# Patient Record
Sex: Male | Born: 1945 | Race: White | Hispanic: No | Marital: Married | Smoking: Former smoker
Health system: Southern US, Community
[De-identification: ages and names within clinical notes are randomized; demographics above are authoritative.]

## PROBLEM LIST (undated history)

## (undated) DIAGNOSIS — E119 Type 2 diabetes mellitus without complications: Secondary | ICD-10-CM

## (undated) DIAGNOSIS — T783XXA Angioneurotic edema, initial encounter: Secondary | ICD-10-CM

## (undated) DIAGNOSIS — J45909 Unspecified asthma, uncomplicated: Secondary | ICD-10-CM

## (undated) DIAGNOSIS — J189 Pneumonia, unspecified organism: Secondary | ICD-10-CM

## (undated) DIAGNOSIS — I4892 Unspecified atrial flutter: Secondary | ICD-10-CM

## (undated) DIAGNOSIS — W57XXXA Bitten or stung by nonvenomous insect and other nonvenomous arthropods, initial encounter: Secondary | ICD-10-CM

## (undated) DIAGNOSIS — I480 Paroxysmal atrial fibrillation: Secondary | ICD-10-CM

## (undated) DIAGNOSIS — T7840XA Allergy, unspecified, initial encounter: Secondary | ICD-10-CM

## (undated) DIAGNOSIS — G4730 Sleep apnea, unspecified: Secondary | ICD-10-CM

## (undated) DIAGNOSIS — I10 Essential (primary) hypertension: Secondary | ICD-10-CM

## (undated) HISTORY — DX: Angioneurotic edema, initial encounter: T78.3XXA

## (undated) HISTORY — DX: Allergy, unspecified, initial encounter: T78.40XA

## (undated) HISTORY — DX: Bitten or stung by nonvenomous insect and other nonvenomous arthropods, initial encounter: W57.XXXA

## (undated) HISTORY — DX: Unspecified asthma, uncomplicated: J45.909

## (undated) HISTORY — PX: COLONOSCOPY: SHX174

## (undated) HISTORY — PX: CHOLECYSTECTOMY: SHX55

## (undated) HISTORY — DX: Unspecified atrial flutter: I48.92

## (undated) HISTORY — PX: APPENDECTOMY: SHX54

## (undated) HISTORY — DX: Type 2 diabetes mellitus without complications: E11.9

## (undated) HISTORY — DX: Paroxysmal atrial fibrillation: I48.0

## (undated) HISTORY — PX: TONSILLECTOMY: SUR1361

## (undated) HISTORY — DX: Sleep apnea, unspecified: G47.30

## (undated) SURGERY — ECHOCARDIOGRAM, TRANSESOPHAGEAL
Anesthesia: Moderate Sedation

---

## 2008-12-13 ENCOUNTER — Encounter: Payer: Self-pay | Admitting: Pulmonary Disease

## 2008-12-13 HISTORY — PX: OTHER SURGICAL HISTORY: SHX169

## 2009-06-07 ENCOUNTER — Encounter: Payer: Self-pay | Admitting: Pulmonary Disease

## 2009-06-11 ENCOUNTER — Encounter: Admission: RE | Admit: 2009-06-11 | Discharge: 2009-06-11 | Payer: Self-pay | Admitting: Emergency Medicine

## 2009-06-14 ENCOUNTER — Ambulatory Visit: Payer: Self-pay | Admitting: Pulmonary Disease

## 2009-06-14 DIAGNOSIS — J9801 Acute bronchospasm: Secondary | ICD-10-CM | POA: Insufficient documentation

## 2009-06-15 DIAGNOSIS — I10 Essential (primary) hypertension: Secondary | ICD-10-CM | POA: Insufficient documentation

## 2009-06-20 ENCOUNTER — Observation Stay (HOSPITAL_COMMUNITY): Admission: AD | Admit: 2009-06-20 | Discharge: 2009-06-21 | Payer: Self-pay | Admitting: Family Medicine

## 2009-06-20 ENCOUNTER — Ambulatory Visit: Payer: Self-pay | Admitting: Family Medicine

## 2009-06-20 ENCOUNTER — Encounter: Payer: Self-pay | Admitting: Pulmonary Disease

## 2009-06-21 ENCOUNTER — Ambulatory Visit: Payer: Self-pay | Admitting: Pulmonary Disease

## 2009-06-26 ENCOUNTER — Ambulatory Visit: Payer: Self-pay | Admitting: Pulmonary Disease

## 2009-06-27 ENCOUNTER — Telehealth: Payer: Self-pay | Admitting: Pulmonary Disease

## 2009-07-19 ENCOUNTER — Ambulatory Visit: Payer: Self-pay | Admitting: Pulmonary Disease

## 2011-01-06 ENCOUNTER — Encounter: Payer: Self-pay | Admitting: Emergency Medicine

## 2011-03-23 LAB — COMPREHENSIVE METABOLIC PANEL
ALT: 24 U/L (ref 0–53)
BUN: 7 mg/dL (ref 6–23)
CO2: 26 mEq/L (ref 19–32)
Calcium: 9.2 mg/dL (ref 8.4–10.5)
Creatinine, Ser: 1.04 mg/dL (ref 0.4–1.5)
GFR calc non Af Amer: >60 mL/min (ref >60)
Glucose, Bld: 109 mg/dL — ABNORMAL HIGH (ref 70–99)
Sodium: 139 mEq/L (ref 135–145)

## 2011-03-23 LAB — CULTURE, BLOOD (ROUTINE X 2): Culture: NO GROWTH

## 2011-03-23 LAB — CBC
HCT: 44.5 % (ref 39.0–52.0)
Hemoglobin: 15.1 g/dL (ref 13.0–17.0)
Hemoglobin: 15.3 g/dL (ref 13.0–17.0)
MCHC: 34.4 g/dL (ref 30.0–36.0)
MCHC: 35.5 g/dL (ref 30.0–36.0)
MCV: 91.7 fL (ref 78.0–100.0)
MCV: 92.1 fL (ref 78.0–100.0)
RBC: 4.65 MIL/uL (ref 4.22–5.81)
RBC: 4.83 MIL/uL (ref 4.22–5.81)
RDW: 12.4 % (ref 11.5–15.5)

## 2011-03-23 LAB — DIFFERENTIAL
Eosinophils Absolute: 0 10*3/uL (ref 0.0–0.7)
Lymphs Abs: 1 10*3/uL (ref 0.7–4.0)
Neutro Abs: 3.8 10*3/uL (ref 1.7–7.7)
Neutrophils Relative %: 70 % (ref 43–77)

## 2011-03-23 LAB — BASIC METABOLIC PANEL
CO2: 28 mEq/L (ref 19–32)
Chloride: 105 mEq/L (ref 96–112)
Creatinine, Ser: 1 mg/dL (ref 0.4–1.5)
GFR calc Af Amer: >60 mL/min (ref >60)
Sodium: 138 mEq/L (ref 135–145)

## 2011-04-29 NOTE — Discharge Summary (Signed)
[name], [name]             ACCOUNT NO.:  [id number]   MEDICAL RECORD NO.:  [id number]          PATIENT TYPE:  INP   LOCATION:  5152                         FACILITY:  [facility]   PHYSICIAN:  Nestor Ramp, MD        DATE OF BIRTH:  05-03-1946   DATE OF ADMISSION:  06/20/2009  DATE OF DISCHARGE:  06/21/2009                               DISCHARGE SUMMARY   PRIMARY CARE PHYSICIAN:  Brett Canales A. Cleta Alberts, M.D. at [facility].   PULMONOLOGIST:  Oretha Milch, MD   DISCHARGE DIAGNOSES:  1. Persistent cough.  2. Hypertension.   DISCHARGE MEDICATIONS:  1. Zithromax 500 mg 1 tab p.o. daily x4 days.  2. QVAR 2 puffs twice a day.  3. Clonidine HCL 0.1 mg p.o. b.i.d.   CONSULT:  Pulmonology, Dr. Vassie Loll who evaluated the patient and he was not  convinced of a left lower lobe pneumonia except questions on the CT  scan, he advised the patient to follow up with PFT's and recommended the  patient to start on QVAR.   PROCEDURES:  Done on June 20, 2009 include a CT of the chest without  contrast, it was consistent with COPD, peribronchial thickening and a  left lower lobe infiltrate.   LABORATORY DATA:  Cold agglutinin titer was drawn and it came back with  a ratio of 1:128 which is a positive titer for that.  A direct  antiglobulin was also drawn and that came back as positive as well.  Blood cultures are still preliminary, but they are so far negative x2.   BRIEF HOSPITAL COURSE:  This is a 65 year old male with the history of  hypertension presenting with about a 3-week history of non-productive  cough.  1. Cough:  The patient has the history of several allergies to      different medicines.  He was started on blood pressure medicine      back in November.  He had an allergy to ACE INHIBITOR, BYSTOLIC,      NORVASC, DAYPRO, and a few others.  He then started getting a      cough, that started in November, and he just could not seem to      shake the cough throughout the last 9 months.   The patient has been      following up with [facility] and over the course of the      past 2-3 days, spiked a fever, had a headache, started to feel      generally ill, so he was referred here for admission.  On      admission, the patient looks well.  He had no acute distress.      Pulmonary was consulted as we could not find a source for this      patient's cough.  Pulmonary recommended PFT's and for the patient      to be followed up as outpatient; however when his cold agglutinin      titer came back positive and his direct antiglobulin came back      positive, a drug-induced anemia  cannot be ruled out and he is to be      followed up as an outpatient.  The patient was discharged with a      Pulmonary appointment to follow up with them to try and find a      source for his cough.  Pulmonary seemed to think it was more of a      restrictive  airway disease rather than something acute that was      going on.   1. Hypertension, blood pressure was well-controlled.  The patient was      continued on his home regimen of clonidine 0.1 mg p.o. b.i.d.  The      patient had stopped the clonidine before his hospital admission and      he was advised not to do that again, as the clonidine can cause a      reactive hypertension.   DISCHARGE INSTRUCTIONS:  The patient was instructed to follow up with  Dr. Cleta Alberts at [facility], as well as Dr. Vassie Loll.  He was also  encouraged to return to the ER or to call 911 if he became short of  breath or if having problem with breathing.   PENDING LABORATORY RESULTS:  Blood cultures which were preliminary but  were negative x2.   FOLLOWUP APPOINTMENTS:  An appointment with Dr. Vassie Loll.  The patient  needed to call for an appointment.  The patient also needed to follow up  with Dr. Cleta Alberts as necessary.   Issues that needed to be followed after discharge are the patient's  positive agglutinin titer and his positive direct antiglobulin.   DISCHARGE  CONDITION:  The patient was discharged to home in stable  medical condition.      Alvia Grove, DO  Electronically Signed      Nestor Ramp, MD  Electronically Signed    BB/MEDQ  D:  06/25/2009  T:  06/26/2009  Job:  [id number]   cc:   Oretha Milch, MD  [place] Cleta Alberts, M.D.
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
## 2011-04-29 NOTE — H&P (Signed)
[name], [name]             ACCOUNT NO.:  [id number]   MEDICAL RECORD NO.:  [id number]          PATIENT TYPE:  INP   LOCATION:  5152                         FACILITY:  [facility]   PHYSICIAN:  Jared Ramp, MD        DATE OF BIRTH:  July 17, 1946   DATE OF ADMISSION:  06/20/2009  DATE OF DISCHARGE:                              HISTORY & PHYSICAL   PRIMARY CARE PHYSICIAN:  Dr. Cleta [name] at [facility].   PULMONOLOGIST:  Jared Locket Vassie Loll, MD, [name], [place].   CHIEF COMPLAINT:  Cough.   HISTORY OF PRESENT ILLNESS:  About 3 weeks ago, the patient presented to  Dr.Daub at [facility] with a questionable pneumonia.  The  patient was started on Avelox and steroids, which he continued for 10  days.  The patient noted no resolve in his symptoms.  He followed up  with pulmonologist at [facility] on June 15, 2009, where he was  evaluated and again started on inhalers again with no help.  He  presented again to the Urgent Care last night with a headache, fever, T-  max of 100.6, increase in blood pressure up to 190.  He was given  Benadryl and Tylenol with no resolve of symptoms.  The patient followed  up with Urgent Care today, and was then admitted to our service.  The  patient complains currently of a cough with some white production,  positive for fever and chills, and positive for nausea.  No vomiting,  diarrhea, or constipation.  No chest pain.  No shortness of breath with  exertion or rest.  Positive for dizziness and headache.   PAST MEDICAL HISTORY:  Shingles last year and hypertension.  Started on  meds, November 2009.   PAST SURGICAL HISTORY:  Appendectomy and tonsillectomy many years ago.   FAMILY HISTORY:  The patient's mother passed away of duodenal cancer.  She also had a history of hypertension.  The patient's father had a  stroke at 6 years of age, and also had a history of MI.  The patient's  siblings:  Sister has a history of hypertension,  brother has mild  muscular dystrophy.   SOCIAL HISTORY:  The patient lives with his wife, daughter, and son.  He  admits to mild tobacco use, which he stopped about 10 years ago, smoking  only pipes.  Alcohol use:  He is a social drinker.  No history of drugs.  The patient works for [employer] where he comes in  contact quite often with soils and other environmental toxins.   MEDICATIONS:  Clonidine 0.1 mg p.o. b.i.d.   ALLERGIES:  LISINOPRIL, BYSTOLIC, NORVASC, DAYPRO, and BISOPROLOL.   REVIEW OF SYSTEMS:  GENERAL:  Positive for fevers, chills, sweats,  decrease in appetite, fatigue, and weight change.  The patient states  that there has been an intentional 20-pound weight loss in the last 3-4  months.  HEENT:  Positive for headache.  RESPIRATORY:  Positive for  cough, wheezing, and production of sputum.  SKIN:  Positive for rashes,  currently resolved.  NEURO:  Positive for weakness.   PHYSICAL EXAMINATION:  VITAL SIGNS:  Currently unavailable.  GENERAL:  No acute distress.  Alert and oriented x3.  Speaking in full  sentences.  HEENT:  Eyes are nonicteric.  PERRL.  Head is normocephalic.  No trauma.  Throat:  No erythema.  No exudate.  Nose:  No maxillary or ethmoid sinus  tenderness.  NECK:  No JVD noted.  No masses.  CV:  Regular rate and rhythm.  No rubs, murmurs, or gallops.  LUNGS:  Rhonchi noted at right lower lobe.  CHEST:  Symmetrical with respirations.  No retractions.  No crackles or  wheezes heard.  ABDOMEN:  Soft.  Nontender.  No masses.  Positive bowel sounds x4.  EXTREMITIES:  No clubbing, cyanosis, or edema.  Pulses are 2+.  NEURO:  Cranial nerves II through XII were intact.  MUSCULOSKELETAL:  No deformities.  No cyanosis or clubbing.   LABORATORY DATA:  June 07, 2009; CT of the head was done without  contrast, it was clear.  It shows the sinuses, ears, and mastoid all to  be clear.  June 20, 2009, ESR was 63.  Chest x-ray was negative.  These   were done at [facility].   ASSESSMENT AND PLAN:  A 65 year old male with a history of cough,  dyspnea, malaise x2 days.  Admitted to the floor.   1. Cough.  Unclear etiology.  Workup has been negative so far.  He has      been worked up at [facility] as well as started seeing a      pulmonologist at [facility].  This has been going on for      about 3 weeks.  He was on Avelox and steroids for about 10 days      with no relief.  Pulmonologist questioned a possible bronchospasm      with questionable exposure to environmental toxin.  The patient was      to follow up with pulmonologist on Friday for a [name] function      test.  Fever, chills, and headache had been there for one day.  We      will order blood cultures.  We will get a CT with contrast of his      chest.  We will repeat his BMP and CBC in the a.m., and look for      any cause of infection.  We will hold off on the antibiotics for      now as the patient just finished his course of Avelox, and we will      do a further workup tomorrow, pending the result of the CT of his      chest.  2. Hypertension.  Blood pressure at [facility] today was      162/72.  The patient has had multiple allergies to hypertensive      medications.  He is currently on clonidine 0.1 mg p.o. b.i.d., but      he did not take his medicine today as he was feeling poorly.  We      will start his clonidine and monitor his blood pressure.  3. Prophylaxis.  The patient was started on heparin 5000 units t.i.d.  4. FEN/GI.  Low-sodium, heart healthy diet.  No IV fluids needed at      this time.  5. Disposition will be pending, his resolution of symptom.      Alvia Grove, DO  Electronically  Signed      Jared Ramp, MD  Electronically Signed    BB/MEDQ  D:  06/20/2009  T:  06/21/2009  Job:  [id number]

## 2012-01-26 ENCOUNTER — Other Ambulatory Visit: Payer: Self-pay | Admitting: Emergency Medicine

## 2012-01-26 NOTE — Telephone Encounter (Signed)
PLEASE PULL CHART

## 2012-02-02 ENCOUNTER — Other Ambulatory Visit: Payer: Self-pay | Admitting: Emergency Medicine

## 2012-02-02 ENCOUNTER — Telehealth: Payer: Self-pay

## 2012-02-02 NOTE — Telephone Encounter (Signed)
Pt would like a refill on his blood pressure rx Mitzam, pt did not know he would have to have an OV in order to have a refill. [facility] [address]

## 2012-02-03 ENCOUNTER — Telehealth: Payer: Self-pay

## 2012-02-03 ENCOUNTER — Emergency Department (INDEPENDENT_AMBULATORY_CARE_PROVIDER_SITE_OTHER): Payer: 59

## 2012-02-03 ENCOUNTER — Encounter (HOSPITAL_BASED_OUTPATIENT_CLINIC_OR_DEPARTMENT_OTHER): Payer: Self-pay | Admitting: Emergency Medicine

## 2012-02-03 ENCOUNTER — Other Ambulatory Visit: Payer: Self-pay

## 2012-02-03 ENCOUNTER — Inpatient Hospital Stay (HOSPITAL_BASED_OUTPATIENT_CLINIC_OR_DEPARTMENT_OTHER)
Admission: EM | Admit: 2012-02-03 | Discharge: 2012-02-04 | DRG: 310 | Disposition: A | Discharge status: Home / Self Care | Payer: 59 | Attending: Internal Medicine | Admitting: Internal Medicine

## 2012-02-03 DIAGNOSIS — I10 Essential (primary) hypertension: Secondary | ICD-10-CM | POA: Diagnosis present

## 2012-02-03 DIAGNOSIS — I4891 Unspecified atrial fibrillation: Secondary | ICD-10-CM

## 2012-02-03 DIAGNOSIS — R0602 Shortness of breath: Secondary | ICD-10-CM

## 2012-02-03 DIAGNOSIS — I4892 Unspecified atrial flutter: Secondary | ICD-10-CM

## 2012-02-03 DIAGNOSIS — R000 Tachycardia, unspecified: Secondary | ICD-10-CM

## 2012-02-03 DIAGNOSIS — Z79899 Other long term (current) drug therapy: Secondary | ICD-10-CM

## 2012-02-03 DIAGNOSIS — F411 Generalized anxiety disorder: Secondary | ICD-10-CM

## 2012-02-03 HISTORY — DX: Pneumonia, unspecified organism: J18.9

## 2012-02-03 HISTORY — DX: Essential (primary) hypertension: I10

## 2012-02-03 LAB — DIFFERENTIAL
Basophils Absolute: 0 10*3/uL (ref 0.0–0.1)
Eosinophils Relative: 3 % (ref 0–5)
Lymphocytes Relative: 29 % (ref 12–46)
Neutro Abs: 4.6 10*3/uL (ref 1.7–7.7)

## 2012-02-03 LAB — COMPREHENSIVE METABOLIC PANEL
ALT: 35 U/L (ref 0–53)
AST: 24 U/L (ref 0–37)
CO2: 27 mEq/L (ref 19–32)
Calcium: 9.4 mg/dL (ref 8.4–10.5)
Chloride: 104 mEq/L (ref 96–112)
GFR calc non Af Amer: 63 mL/min — ABNORMAL LOW (ref >90)
Sodium: 140 mEq/L (ref 135–145)

## 2012-02-03 LAB — CARDIAC PANEL(CRET KIN+CKTOT+MB+TROPI)
CK, MB: 7.1 ng/mL (ref 0.3–4.0)
Relative Index: 3.2 — ABNORMAL HIGH (ref 0.0–2.5)
Total CK: 224 U/L (ref 7–232)
Troponin I: <0.3 ng/mL (ref <0.30)

## 2012-02-03 LAB — CBC
Platelets: 291 10*3/uL (ref 150–400)
RDW: 13 % (ref 11.5–15.5)
WBC: 7.9 10*3/uL (ref 4.0–10.5)

## 2012-02-03 MED ORDER — DILTIAZEM HCL 25 MG/5ML IV SOLN
INTRAVENOUS | Status: AC
  Filled 2012-02-03: qty 5

## 2012-02-03 MED ORDER — ENOXAPARIN SODIUM 60 MG/0.6ML ~~LOC~~ SOLN
120.0000 mg | Freq: Once | SUBCUTANEOUS | Status: AC
  Administered 2012-02-03: 120 mg via SUBCUTANEOUS
  Filled 2012-02-03: qty 1.2

## 2012-02-03 MED ORDER — SODIUM CHLORIDE 0.9 % IV SOLN
Freq: Once | INTRAVENOUS | Status: AC
  Administered 2012-02-03: 22:00:00 via INTRAVENOUS

## 2012-02-03 MED ORDER — DILTIAZEM HCL 50 MG/10ML IV SOLN
10.0000 mg | Freq: Once | INTRAVENOUS | Status: AC
  Administered 2012-02-03: 10 mg via INTRAVENOUS
  Filled 2012-02-03: qty 2

## 2012-02-03 MED ORDER — DEXTROSE 5 % IV SOLN
5.0000 mg/h | Freq: Once | INTRAVENOUS | Status: AC
  Administered 2012-02-03: 5 mg/h via INTRAVENOUS

## 2012-02-03 MED ORDER — DILTIAZEM HCL 100 MG IV SOLR
INTRAVENOUS | Status: AC
  Filled 2012-02-03: qty 100

## 2012-02-03 NOTE — ED Provider Notes (Signed)
History     CSN: [id number]  Arrival date & time 02/03/12  2051   First MD Initiated Contact with Patient 02/03/12 2111      Chief Complaint  Patient presents with  . Tachycardia    (Consider location/radiation/quality/duration/timing/severity/associated sxs/prior treatment) HPI Comments: States was not feeling well on Sunday, today started with heart racing, feeling weak.  Denies fevers or chills.  Feels short of breath.  Had stress echo two years ago with Dr. Allyson Sabal that was okay.  Patient is a 66 y.o. male presenting with palpitations. The history is provided by the patient.  Palpitations  This is a new problem. The current episode started 3 to 5 hours ago. The problem occurs constantly. The problem has been gradually worsening. The problem is associated with an unknown factor. Associated symptoms include diaphoresis, malaise/fatigue and shortness of breath. Pertinent negatives include no fever, no lower extremity edema and no cough. He has tried nothing for the symptoms. Risk factors: hypertension.    Past Medical History  Diagnosis Date  . Hypertension     Past Surgical History  Procedure Date  . Appendectomy   . Tonsillectomy     No family history on file.  History  Substance Use Topics  . Smoking status: Never Smoker   . Smokeless tobacco: Current User  . Alcohol Use: No      Review of Systems  Constitutional: Positive for malaise/fatigue and diaphoresis. Negative for fever.  Respiratory: Positive for shortness of breath. Negative for cough.   Cardiovascular: Positive for palpitations.  All other systems reviewed and are negative.    Allergies  Lisinopril and Oxaprozin  Home Medications  No current outpatient prescriptions on file.  BP 153/91  Pulse 158  Temp(Src) 99.3 F (37.4 C) (Oral)  Resp 18  Ht 6\' 2"  (1.88 m)  Wt 260 lb (117.935 kg)  BMI 33.38 kg/m2  SpO2 98%  Physical Exam  Nursing note and vitals reviewed. Constitutional: He is  oriented to person, place, and time. He appears well-developed and well-nourished.  HENT:  Head: Normocephalic and atraumatic.  Mouth/Throat: No oropharyngeal exudate.  Neck: Normal range of motion. Neck supple.  Cardiovascular:       Tachycardic, irregularly irregular.    Pulmonary/Chest: Effort normal and breath sounds normal. No respiratory distress. He has no wheezes.  Abdominal: Soft. Bowel sounds are normal. He exhibits no distension. There is no tenderness.  Musculoskeletal: Normal range of motion. He exhibits no edema.  Neurological: He is alert and oriented to person, place, and time. No cranial nerve deficit. Coordination normal.  Skin: He is diaphoretic.       Diaphoretic    ED Course  Procedures (including critical care time)   Labs Reviewed  CARDIAC PANEL(CRET KIN+CKTOT+MB+TROPI)  CBC  DIFFERENTIAL  COMPREHENSIVE METABOLIC PANEL  TSH   No results found.   No diagnosis found.   Date: 02/03/2012  Rate: 119  Rhythm: atrial flutter  QRS Axis: normal  Intervals: normal  ST/T Wave abnormalities: nonspecific ST changes  Conduction Disutrbances:none  Narrative Interpretation:   Old EKG Reviewed: none available    MDM  The patient arrived in afib, the rate improved with cardizem and the patient feels better, although still in afib.  The labs look okay.  This is his first episode of this.  Will consult cardiology.  I spoke with sehv who wants the patient admitted through [facility].  I spoke with Dr. Toniann Fail who will admit the patient to the SDU.  Geoffery Lyons, MD 02/03/12 2253

## 2012-02-03 NOTE — ED Notes (Signed)
Pt reports elevated heart rate

## 2012-02-03 NOTE — Telephone Encounter (Signed)
PATIENT HAS CALLED BEFORE REGARDING REFILL ON HIS BP MEDICATION.  He is a patient of dr Cleta Alberts.    He was told he has to come in to be seen "but no one has EVER said that to me before"  I scheduled him for a CPE in March and he would like to know if he can have a refill on his bp meds in the meantime.

## 2012-02-03 NOTE — ED Notes (Signed)
Watching TV with wife at bedside. Monitor atrial flutter. Pt denies pain. Waiting for cardiology consult and room assignment.

## 2012-02-03 NOTE — Telephone Encounter (Signed)
DR. Cleta Alberts   PT HAS AN APPOINTMENT SCHEDULED WITH YOU ON MARCH 26TH MEANWHILE HE IS OUT OF HIS BLOOD PRESSURE MEDICIATIONS [facility]  CALL BACK [phone]

## 2012-02-03 NOTE — ED Notes (Signed)
States for the past 2 days he has felt tightness in his chest, sob and indigestion. [facility] and it was 161.  Went back home and checked his home BP machine and saw his heart rate has been in the 160's for the past 2 days. On arrival EKG was done. Monitor with alarms shows atrial fib. Placed on oxygen 2l/m [place]. IV was started. Pt was examined by Dr Judd Lien and medications were given.

## 2012-02-03 NOTE — Telephone Encounter (Signed)
[facility] in [facility] Mat[facility] and notified pt

## 2012-02-04 ENCOUNTER — Encounter (HOSPITAL_COMMUNITY): Payer: Self-pay | Admitting: *Deleted

## 2012-02-04 ENCOUNTER — Other Ambulatory Visit: Payer: Self-pay | Admitting: Internal Medicine

## 2012-02-04 DIAGNOSIS — I4892 Unspecified atrial flutter: Secondary | ICD-10-CM | POA: Diagnosis present

## 2012-02-04 LAB — CARDIAC PANEL(CRET KIN+CKTOT+MB+TROPI): CK, MB: 5.8 ng/mL — ABNORMAL HIGH (ref 0.3–4.0)

## 2012-02-04 LAB — MAGNESIUM: Magnesium: 2.1 mg/dL (ref 1.5–2.5)

## 2012-02-04 LAB — APTT: aPTT: 39 seconds — ABNORMAL HIGH (ref 24–37)

## 2012-02-04 LAB — T4, FREE: Free T4: 1.18 ng/dL (ref 0.80–1.80)

## 2012-02-04 LAB — LIPID PANEL
Cholesterol: 161 mg/dL (ref 0–200)
Total CHOL/HDL Ratio: 4.7 RATIO

## 2012-02-04 LAB — HEMOGLOBIN A1C: Hgb A1c MFr Bld: 5.9 % — ABNORMAL HIGH (ref <5.7)

## 2012-02-04 LAB — MRSA PCR SCREENING: MRSA by PCR: NEGATIVE

## 2012-02-04 LAB — PROTIME-INR: INR: 1.07 (ref 0.00–1.49)

## 2012-02-04 MED ORDER — RIVAROXABAN 20 MG PO TABS: 20.0000 mg | ORAL_TABLET | ORAL | Status: DC

## 2012-02-04 MED ORDER — ACETAMINOPHEN 325 MG PO TABS: 650.0000 mg | ORAL_TABLET | Freq: Four times a day (QID) | ORAL | Status: DC | PRN

## 2012-02-04 MED ORDER — SODIUM CHLORIDE 0.9 % IJ SOLN
3.0000 mL | Freq: Two times a day (BID) | INTRAMUSCULAR | Status: DC
  Administered 2012-02-04: 3 mL via INTRAVENOUS

## 2012-02-04 MED ORDER — DILTIAZEM HCL ER COATED BEADS 240 MG PO CP24
240.0000 mg | ORAL_CAPSULE | Freq: Every day | ORAL | Status: DC
  Administered 2012-02-04: 240 mg via ORAL
  Filled 2012-02-04: qty 1

## 2012-02-04 MED ORDER — ENOXAPARIN SODIUM 150 MG/ML ~~LOC~~ SOLN
1.0000 mg/kg | Freq: Two times a day (BID) | SUBCUTANEOUS | Status: DC
  Filled 2012-02-04 (×2): qty 1

## 2012-02-04 MED ORDER — ACETAMINOPHEN 650 MG RE SUPP: 650.0000 mg | Freq: Four times a day (QID) | RECTAL | Status: DC | PRN

## 2012-02-04 MED ORDER — SODIUM CHLORIDE 0.9 % IJ SOLN: 3.0000 mL | INTRAMUSCULAR | Status: DC | PRN

## 2012-02-04 MED ORDER — SODIUM CHLORIDE 0.9 % IV SOLN: 250.0000 mL | INTRAVENOUS | Status: DC | PRN

## 2012-02-04 MED ORDER — RIVAROXABAN 10 MG PO TABS
20.0000 mg | ORAL_TABLET | ORAL | Status: DC
  Filled 2012-02-04: qty 2

## 2012-02-04 MED ORDER — METOPROLOL SUCCINATE ER 25 MG PO TB24: 25.0000 mg | ORAL_TABLET | Freq: Every day | ORAL | Status: DC

## 2012-02-04 MED ORDER — DILTIAZEM HCL 25 MG/5ML IV SOLN
10.0000 mg | Freq: Four times a day (QID) | INTRAVENOUS | Status: DC | PRN
  Filled 2012-02-04: qty 5

## 2012-02-04 MED ORDER — SODIUM CHLORIDE 0.9 % IJ SOLN: 3.0000 mL | Freq: Two times a day (BID) | INTRAMUSCULAR | Status: DC

## 2012-02-04 MED ORDER — HEPARIN SODIUM (PORCINE) 5000 UNIT/ML IJ SOLN
5000.0000 [IU] | Freq: Three times a day (TID) | INTRAMUSCULAR | Status: DC
  Filled 2012-02-04 (×3): qty 1

## 2012-02-04 MED ORDER — DILTIAZEM HCL ER COATED BEADS 240 MG PO CP24: 240.0000 mg | ORAL_CAPSULE | Freq: Every day | ORAL | Status: DC

## 2012-02-04 MED ORDER — DILTIAZEM HCL 60 MG PO TABS
60.0000 mg | ORAL_TABLET | Freq: Three times a day (TID) | ORAL | Status: DC
  Administered 2012-02-04: 60 mg via ORAL
  Filled 2012-02-04 (×4): qty 1

## 2012-02-04 NOTE — Telephone Encounter (Signed)
Pt is requesting a that we send him a copy of his medication list of meds he is allergic to please fax [phone]

## 2012-02-04 NOTE — Discharge Instructions (Signed)
You were admitted to the hospital due to a rapid heart rate from Atrial Flutter (information below). Please take your two new prescriptions as prescribed. Please follow up with Dr. Rennis Golden as described below.    Come to the Short Stay Unit at [facility] at 12:00 on Friday the 22nd of Feb.  For transesophageal  Echo and cardioversion with Dr. Rennis Golden. Do Not eat or drink after Midnight the night  Before. Bring your meds with you to short stay. Call Dr. Blanchie Dessert office if you have questions or problems.  Atrial Flutter Atrial flutter is a heart rhythm that can cause the heart to beat very fast (tachycardia). It originates in the upper chambers of the heart (atria). In atrial flutter, the top chambers of the heart (atria) often beat much faster than the bottom chambers of the heart (ventricles). Atrial flutter has a regular "saw toothed" appearance in an EKG readout. An EKG is a test that records the electrical activity of the heart. Atrial flutter can cause the heart to beat up to 150 beats per minute (BPM). Atrial flutter can either be short lived (paroxysmal) or permanent.  CAUSES  Causes of atrial flutter can be many. Some of these include:  Heart related issues:   Heart attack (myocardial infarction).   Heart failure.   Heart valve problems.   Poorly controlled high blood pressure (hypertension).   Afteropen heart surgery.   Lung related issues:   A blood clot in the lungs (pulmonary embolism).   Chronic obstructive pulmonary disease (COPD). Medications used to treat COPD can attribute to atrial flutter.   Other related causes:   Hyperthyroidism.   Caffeine.   Some decongestant cold medications.   Low electrolyte levels such as potassium or magnesium.   Cocaine.  SYMPTOMS  An awareness of your heart beating rapidly (palpitations).   Shortness of breath.   Chest pain.   Low blood pressure (hypotension).   Dizziness or fainting.  DIAGNOSIS  Different tests can be performed  to diagnose atrial flutter.   An EKG.   Holter monitor. This is a 24 hour recording of your heart rhythm. You will also be given a diary. Write down all symptoms that you have and what you were doing at the time you experienced symptoms.   Cardiac event monitor. This small device can be worn for up to 30 days. When you have heart symptoms, you will push a button on the device. This will then record your heart rhythm.   Echocardiogram. This is an imaging test to look at your heart. Your caregiver will look at your heart valves and the ventricles.   Stress Test. This test can help determine if the atrial flutter is related to exercise or if coronary artery disease is present.   Laboratory studies will look at certain blood levels like:   Complete blood count (CBC).   Potassium.   Magnesium.   Thyroid function.  TREATMENT  Treatment of atrial flutter varies. A combination of therapies may be used or sometimes atrial flutter may need only 1 type of treatment.  Lab work: If your blood work, such as your electrolytes (potassium, magnesium) or your thyroid function tests are abnormal, your caregiver will treat them accordingly.  Medication:  There are several different types of medications that can convert your heart to a normal rhythm and prevent atrial flutter from reoccurring.  Nonsurgical procedures: Nonsurgical techniques may be used to control atrial flutter. Some examples include:  Cardioversion. This technique uses either drugs or an  electrical shock to restore a normal heart rhythm:   Cardioversion drugs may be given through an intravenous (IV) line to help "reset" the heart rhythm.   In electrical cardioversion, your caregiver shocks your heart with electrical energy. This helps to reset the heartbeat to a normal rhythm.   Ablation. If atrial flutter is a persistent problem, an ablation may be needed. This procedure is done under mild sedation. High frequency radio-wave energy  is used to destroy the area of heart tissue responsible for atrial flutter.  SEEK IMMEDIATE MEDICAL CARE IF:   Dizziness.   Near fainting or fainting.   Shortness of breath.   Chest pain or pressure.   Sudden nausea or vomiting.   Profuse sweating.  If you have the above symptoms, call your local emergency service immediately! Do not drive yourself to the hospital. MAKE SURE YOU:   Understand these instructions.   Will watch your condition.   Will get help right away if you are not doing well or get worse.  Document Released: 04/19/2009 Document Revised: 08/13/2011 Document Reviewed: 04/19/2009 [facility] Patient Information 2012 [place], [place].

## 2012-02-04 NOTE — Progress Notes (Signed)
02/04/2012 1:01 PM  Discussed discharge instructions and medications with patient and family member. Answered all questions. Vss. Pt has no complaints of pain. . All IV's are dc'ed with tips intact. Pt discharged home with family.Follow up appointments made and pt understands.   Celesta Gentile 2/20/20131:01 PM

## 2012-02-04 NOTE — ED Notes (Signed)
Attempted to call report, nurse not available, will return call for report.

## 2012-02-04 NOTE — H&P (Signed)
Family Medicine Teaching [facility] Admission History and Physical  Patient name: [name] Medical record number: [id number] Date of birth: 03-15-1946 Age: 66 y.o. Gender: male  Primary Care Provider: [facility]   Chief Complaint: Fast heart rate   History of Present Illness: [name] is a very pleasant 66 y.o. year old male with history of HTN presenting with 2-3 days of feeling "bad/tired" and noticed heart rate was 161 on heart rate monitor at [facility] today. Denies any palpitations, just a little "chest tightness" and  being somewhat out of breath on Sunday, which he attributed to moving a lot of heavy things [place] on Saturday. 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  He states he checked BP 2 days ago and was normal, but failed to look at the pulse rate then. Today when he recalled previous entries, he discovered that his pulse was also >140 on Sunday. Denies any CP, diaphoresis, syncope, lighteadedness, swelling, weight changes.  Only takes Diltiazam for BP which he has been compliant with until just this morning when he ran out and was going to pick up rx at [facility] when he discovered high pulse. Has failed many other BP Rx classes.  Normal stress test 3 years ago at [facility] per patient.   Patient initially presented to [facility] ED, found to be in Afib/flutter with RVR up to 160s, started on diltiazem gtt and transferred to [facility] for admission with [facility]. Family Medicine called when discovery of patient's PCP is Dr. Cleta Alberts.  Review Of Systems: Per HPI with the following additions:  Denies: recent infections, fever, n/v/d, constipation, HA, blood loss, dysuria.  Otherwise 12 point review of systems was performed and was unremarkable.  Patient Active Problem List  Diagnoses  . HYPERTENSION  . BRONCHOSPASM   Past Medical History: Past Medical History  Diagnosis Date  . Hypertension   . Anxiety   . Pneumonia     Past Surgical History: Past  Surgical History  Procedure Date  . Appendectomy   . Tonsillectomy     Social History: Tobacco: smoked a pipe about 12 years ago Etoh: denied Drugs: denied  Family History: Dad w/ arrhythmia Mother w/ pancreatic cancer   Allergies: Allergies  Allergen Reactions  . Ibuprofen Swelling  . Lisinopril     Swelling   . Oxaprozin     swelling    Current Facility-Administered Medications  Medication Dose Route Frequency Provider Last Rate Last Dose  . 0.9 %  sodium chloride infusion   Intravenous Once Geoffery Lyons, MD 20 mL/hr at 02/03/12 2132    . diltiazem (CARDIZEM) 100 mg in dextrose 5 % 100 mL infusion  5 mg/hr Intravenous Once Geoffery Lyons, MD 5 mL/hr at 02/03/12 2320 5 mg/hr at 02/03/12 2320  . diltiazem (CARDIZEM) injection SOLN 10 mg  10 mg Intravenous Once Geoffery Lyons, MD   10 mg at 02/03/12 2133  . enoxaparin (LOVENOX) injection 120 mg  120 mg Subcutaneous Once Geoffery Lyons, MD   120 mg at 02/03/12 2318  . DISCONTD: diltiazem (CARDIZEM) 100 MG injection           . DISCONTD: diltiazem (CARDIZEM) 25 MG/5ML injection             Home Medicaitons:  Diltiazem 360mg  ER tablet Qday    Physical Exam: BP 120/70  Pulse 83  Temp(Src) 98.1 F (36.7 C) (Oral)  Resp 11  Ht 6\' 2"  (1.88 m)  Wt 269 lb 10 oz (122.3 kg)  BMI 34.62 kg/m2  SpO2 95%  General: alert, cooperative and appears stated age HEENT: PERRLA, extra ocular movement intact, sclera clear, anicteric, neck supple with midline trachea, thyroid without masses and trachea midline Heart: S1, S2 normal, no murmur, rub or gallop, Irregular rate and rhythm Lungs: clear to auscultation, no wheezes or rales and unlabored breathing Abdomen: abdomen is soft without significant tenderness, masses, organomegaly or guarding Extremities: extremities normal, atraumatic, no cyanosis or edema Musculoskeletal: no joint tenderness, deformity or swelling Skin:no rashes, no ecchymoses, no petechiae, no nodules, no  jaundice Neurology: normal without focal findings, mental status, speech normal, alert and oriented x3 and PERLA  Labs and Imaging: Results for orders placed during the hospital encounter of 02/03/12 (from the past 24 hour(s))  CARDIAC PANEL(CRET KIN+CKTOT+MB+TROPI)     Status: Abnormal   Collection Time   02/03/12  9:00 PM      Component Value Range   Total CK 224  7 - 232 (U/L)   CK, MB 7.1 (*) 0.3 - 4.0 (ng/mL)   Troponin I <0.30  <0.30 (ng/mL)   Relative Index 3.2 (*) 0.0 - 2.5   CBC     Status: Normal   Collection Time   02/03/12  9:00 PM      Component Value Range   WBC 7.9  4.0 - 10.5 (K/uL)   RBC 4.97  4.22 - 5.81 (MIL/uL)   Hemoglobin 15.7  13.0 - 17.0 (g/dL)   HCT 16.1  09.6 - 04.5 (%)   MCV 87.7  78.0 - 100.0 (fL)   MCH 31.6  26.0 - 34.0 (pg)   MCHC 36.0  30.0 - 36.0 (g/dL)   RDW 40.9  81.1 - 91.4 (%)   Platelets 291  150 - 400 (K/uL)  DIFFERENTIAL     Status: Normal   Collection Time   02/03/12  9:00 PM      Component Value Range   Neutrophils Relative 58  43 - 77 (%)   Neutro Abs 4.6  1.7 - 7.7 (K/uL)   Lymphocytes Relative 29  12 - 46 (%)   Lymphs Abs 2.3  0.7 - 4.0 (K/uL)   Monocytes Relative 10  3 - 12 (%)   Monocytes Absolute 0.8  0.1 - 1.0 (K/uL)   Eosinophils Relative 3  0 - 5 (%)   Eosinophils Absolute 0.2  0.0 - 0.7 (K/uL)   Basophils Relative 0  0 - 1 (%)   Basophils Absolute 0.0  0.0 - 0.1 (K/uL)  COMPREHENSIVE METABOLIC PANEL     Status: Abnormal   Collection Time   02/03/12  9:00 PM      Component Value Range   Sodium 140  135 - 145 (mEq/L)   Potassium 4.1  3.5 - 5.1 (mEq/L)   Chloride 104  96 - 112 (mEq/L)   CO2 27  19 - 32 (mEq/L)   Glucose, Bld 150 (*) 70 - 99 (mg/dL)   BUN 23  6 - 23 (mg/dL)   Creatinine, Ser 7.82  0.50 - 1.35 (mg/dL)   Calcium 9.4  8.4 - 95.6 (mg/dL)   Total Protein 6.9  6.0 - 8.3 (g/dL)   Albumin 3.7  3.5 - 5.2 (g/dL)   AST 24  0 - 37 (U/L)   ALT 35  0 - 53 (U/L)   Alkaline Phosphatase 72  39 - 117 (U/L)   Total  Bilirubin 0.2 (*) 0.3 - 1.2 (mg/dL)   GFR calc non Af Amer 63 (*) >90 (mL/min)  GFR calc Af Amer 73 (*) >90 (mL/min)  MRSA PCR SCREENING     Status: Normal   Collection Time   02/04/12  1:41 AM      Component Value Range   MRSA by PCR NEGATIVE  NEGATIVE    EKG: Atrial Flutter, rate 119  CXR: No acute cardiopulmonary process  Assessment and Plan: [name] is a 66 y.o. year old male presenting with 3 days malaise and found to be in new onset atrial flutter with RVR.  1. Atrial Flutter: New onset w/ RVR now resolved on Diltiazem infusion at low dose 5/hr, borderline hypotension 90-110 SBP and pulse in 60s. Resolved chest tightness and SOB. No known etiology, family h/o of recurrent Atrial flutter in father. Unusual this would develop while on Diltiazam 360mg  ER for BP.  Initial Troponins negative and mildly elevated CKMB. Likely from cardiac strain. Stress test from 3 years ago unremarkable-done at [facility]. - Continue care in step down - Will Dc drip as pt w/ borderline hypotension and hr of 60, and change to PO Dilt 60TID-should be able to tolerate given home dose 360mg .  - PRN IV Dilt w/ Parameters - Echo W/ Contrast - Consult [facility] as pt has been seen by their practice - TSH, free T4 - CE - CBC - Coagulation panel - Hgb A1c and lipid panel for risk stratification. Elevated BG noted on admission likely from stress - CHADS score of 1 so will hold anticoagulation outside of heparin  2. FEN/GI: Tolerating regular diet at home. Electrolytes normal - NPO, pending study recommendations from cards - KVO - BMET, Mg, Phos  3. Prophylaxis:  - Hep [place] TID  4. Disposition: Pending further workup    Signed: Latreece Mochizuki, M.D. Family Medicine Resident PGY-1 02/04/2012 5:13 AM  I have seen and evaluated patient above with Dr. Konrad Dolores and made changes to H&P as necessary.   Lloyd Huger, MD [facility] Family Medicine Resident - PGY-2 02/04/2012 7:35 AM

## 2012-02-04 NOTE — Consult Note (Signed)
Pt. Seen and examined. Agree with the NP/PA-C note as written. New onset atrial flutter .Marland Kitchen Surprising that he broke through 360 mg daily of diltiazem to RVR.  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 I discussed options with him, but feel it would be beneficial to r/o thrombus with TEE + cardioversion.  We will try to arrange this on Friday. Don't see why he cannot be at home and come back as an outpatient.  We can anticoagulate him with xarelto.  I would put him back on long-acting cardizem CD at a reduced dose (240 mg) daily and add Toprol XL 25 mg daily.  He would be a good candidate for ablation and I will likely refer him after we have re-established sinus.  Chrystie Nose, MD, [facility] Attending Cardiologist [facility]

## 2012-02-04 NOTE — Progress Notes (Signed)
Utilization Review Completed.Harmonii Karle T2/20/2013   

## 2012-02-04 NOTE — Consult Note (Signed)
Reason for Consult: atrial flutter   Referring Physician: [name]    Primary Cardiologist: Dr. Shela Commons. [name] is an 66 y.o. male.    Chief Complaint: chest tightness, some SOB. Found his heart racing.   HPI: [name] is a very pleasant 66 y.o. year old male with history of HTN presenting with 2-3 days of feeling "bad/tired" and noticed heart rate was 161 on [facility] rate [facility] today. 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 He states he checked BP 2 days ago and was normal, but failed to look at the pulse rate then. Today when he recalled previous entries, he discovered that his pulse was also >140 on Sunday. Denies any CP, diaphoresis, syncope, lighteadedness, swelling, weight changes. Only takes Diltiazam for BP which he has been compliant with until just this morning when he ran out and was going to pick up [facility] at [facility] when he discovered high pulse. Has failed many other BP Rx classes. Normal stress at our office 3 years ago, per the patient.  On reviewing his symptoms the patient states he actually started feeling under the weather on Friday but his more significant symptoms did not begin until Sunday. He did have chest pressure with radiation down his left arm, and as stated fatigue and no energy and shortness of breath on occasion. He was also mildly diaphoretic. On occasion he did have some mild dizziness/lightheadedness in the last several days.   He underwent stress myoview 12/13/08 - done for abnormal EKG-was with mild to moderate attenuation with possible nontransmural scar with mild superimposed ischemia in the mid inferior to mid distal inferolateral regions.  EF 61%.  Currently no chest discomfort at all.   Past Medical History  Diagnosis Date  .  Hypertension   . Anxiety   . Pneumonia     Past Surgical History  Procedure Date  . Appendectomy   . Tonsillectomy     No family history on file. Mother died of cancer father died of stroke at age 38 but he did have a history of atrial fibrillation undergoing 2 cardioversions. One sister with a history of atrial for relation. One brother alive and well.  Social History:  reports that he has never smoked. He quit smokeless tobacco use about 16 years ago. He reports that he does not drink alcohol or use illicit drugs. Smoked 2-3 cigarettes a day X 30 yrs. Stopped in 2000.  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 They have 4 children and 4 grandchildren. He does drink 2-3 cups of caffeine in the way of coffee daily.  Allergies:  Allergies  Allergen Reactions  . Ibuprofen Swelling  . Lisinopril     Swelling   . Oxaprozin     swelling  Previously noted allergy to bystolic but no rash or swelling.  Medications Prior to Admission  Medication Dose Route Frequency Provider Last Rate Last Dose  . 0.9 %  sodium chloride infusion   Intravenous Once Geoffery Lyons, MD 20 mL/hr at 02/03/12 2132    . 0.9 %  sodium chloride infusion  250 mL Intravenous PRN Lloyd Huger, MD      . acetaminophen (TYLENOL) tablet 650 mg  650 mg Oral Q6H PRN Lloyd Huger, MD       Or  .  acetaminophen (TYLENOL) suppository 650 mg  650 mg Rectal Q6H PRN Lloyd Huger, MD      . diltiazem (CARDIZEM) 100 mg in dextrose 5 % 100 mL infusion  5 mg/hr Intravenous Once Geoffery Lyons, MD 5 mL/hr at 02/03/12 2320 5 mg/hr at 02/03/12 2320  . diltiazem (CARDIZEM) injection 10 mg  10 mg Intravenous Q6H PRN Lloyd Huger, MD      . diltiazem (CARDIZEM) injection SOLN 10 mg  10 mg Intravenous Once Geoffery Lyons, MD   10 mg at 02/03/12 2133  . diltiazem (CARDIZEM) tablet 60 mg  60 mg Oral Q8H Lloyd Huger, MD   60 mg at 02/04/12 0646  . enoxaparin (LOVENOX) injection 120 mg  120 mg Subcutaneous Once Geoffery Lyons, MD   120 mg at 02/03/12 2318   . heparin injection 5,000 Units  5,000 Units Subcutaneous Q8H Lloyd Huger, MD      . sodium chloride 0.9 % injection 3 mL  3 mL Intravenous Q12H Lloyd Huger, MD      . sodium chloride 0.9 % injection 3 mL  3 mL Intravenous Q12H Lloyd Huger, MD      . sodium chloride 0.9 % injection 3 mL  3 mL Intravenous PRN Lloyd Huger, MD      . DISCONTD: diltiazem (CARDIZEM) 100 MG injection           . DISCONTD: diltiazem (CARDIZEM) 25 MG/5ML injection            No current outpatient prescriptions on file as of 02/04/2012.  OUTPATIENT MEDICATIONS:  Diltiazem cd 360 mg 1 daily.  Results for orders placed during the hospital encounter of 02/03/12 (from the past 48 hour(s))  CARDIAC PANEL(CRET KIN+CKTOT+MB+TROPI)     Status: Abnormal   Collection Time   02/03/12  9:00 PM      Component Value Range Comment   Total CK 224  7 - 232 (U/L)    CK, MB 7.1 (*) 0.3 - 4.0 (ng/mL)    Troponin I <0.30  <0.30 (ng/mL)    Relative Index 3.2 (*) 0.0 - 2.5    CBC     Status: Normal   Collection Time   02/03/12  9:00 PM      Component Value Range Comment   WBC 7.9  4.0 - 10.5 (K/uL)    RBC 4.97  4.22 - 5.81 (MIL/uL)    Hemoglobin 15.7  13.0 - 17.0 (g/dL)    HCT 14.7  82.9 - 56.2 (%)    MCV 87.7  78.0 - 100.0 (fL)    MCH 31.6  26.0 - 34.0 (pg)    MCHC 36.0  30.0 - 36.0 (g/dL)    RDW 13.0  86.5 - 78.4 (%)    Platelets 291  150 - 400 (K/uL)   DIFFERENTIAL     Status: Normal   Collection Time   02/03/12  9:00 PM      Component Value Range Comment   Neutrophils Relative 58  43 - 77 (%)    Neutro Abs 4.6  1.7 - 7.7 (K/uL)    Lymphocytes Relative 29  12 - 46 (%)    Lymphs Abs 2.3  0.7 - 4.0 (K/uL)    Monocytes Relative 10  3 - 12 (%)    Monocytes Absolute 0.8  0.1 - 1.0 (K/uL)    Eosinophils Relative 3  0 - 5 (%)    Eosinophils Absolute 0.2  0.0 - 0.7 (K/uL)    Basophils Relative 0  0 -  1 (%)    Basophils Absolute 0.0  0.0 - 0.1 (K/uL)   COMPREHENSIVE METABOLIC PANEL     Status: Abnormal   Collection Time    02/03/12  9:00 PM      Component Value Range Comment   Sodium 140  135 - 145 (mEq/L)    Potassium 4.1  3.5 - 5.1 (mEq/L)    Chloride 104  96 - 112 (mEq/L)    CO2 27  19 - 32 (mEq/L)    Glucose, Bld 150 (*) 70 - 99 (mg/dL)    BUN 23  6 - 23 (mg/dL)    Creatinine, Ser 1.61  0.50 - 1.35 (mg/dL)    Calcium 9.4  8.4 - 10.5 (mg/dL)    Total Protein 6.9  6.0 - 8.3 (g/dL)    Albumin 3.7  3.5 - 5.2 (g/dL)    AST 24  0 - 37 (U/L)    ALT 35  0 - 53 (U/L)    Alkaline Phosphatase 72  39 - 117 (U/L)    Total Bilirubin 0.2 (*) 0.3 - 1.2 (mg/dL)    GFR calc non Af Amer 63 (*) >90 (mL/min)    GFR calc Af Amer 73 (*) >90 (mL/min)   MRSA PCR SCREENING     Status: Normal   Collection Time   02/04/12  1:41 AM      Component Value Range Comment   MRSA by PCR NEGATIVE  NEGATIVE    MAGNESIUM     Status: Normal   Collection Time   02/04/12  6:24 AM      Component Value Range Comment   Magnesium 2.1  1.5 - 2.5 (mg/dL)   PHOSPHORUS     Status: Normal   Collection Time   02/04/12  6:24 AM      Component Value Range Comment   Phosphorus 4.0  2.3 - 4.6 (mg/dL)   CARDIAC PANEL(CRET KIN+CKTOT+MB+TROPI)     Status: Abnormal   Collection Time   02/04/12  6:24 AM      Component Value Range Comment   Total CK 152  7 - 232 (U/L)    CK, MB 5.8 (*) 0.3 - 4.0 (ng/mL)    Troponin I <0.30  <0.30 (ng/mL)    Relative Index 3.8 (*) 0.0 - 2.5    PRO B NATRIURETIC PEPTIDE     Status: Abnormal   Collection Time   02/04/12  6:24 AM      Component Value Range Comment   Pro B Natriuretic peptide (BNP) 869.2 (*) 0 - 125 (pg/mL)   PROTIME-INR     Status: Normal   Collection Time   02/04/12  6:24 AM      Component Value Range Comment   Prothrombin Time 14.1  11.6 - 15.2 (seconds)    INR 1.07  0.00 - 1.49    APTT     Status: Abnormal   Collection Time   02/04/12  6:24 AM      Component Value Range Comment   aPTT 39 (*) 24 - 37 (seconds)   LIPID PANEL     Status: Abnormal   Collection Time   02/04/12  6:24 AM       Component Value Range Comment   Cholesterol 161  0 - 200 (mg/dL)    Triglycerides 096  <150 (mg/dL)    HDL 34 (*) >04 (mg/dL)    Total CHOL/HDL Ratio 4.7      VLDL 30  0 - 40 (mg/dL)  LDL Cholesterol 97  0 - 99 (mg/dL)    Dg Chest Port 1 View  02/03/2012  *RADIOLOGY REPORT*  Clinical Data: 66 year old male with tachycardia, shortness of breath, hypertension.  PORTABLE CHEST - 1 VIEW  Comparison: Chest CTA 06/20/2009.  Findings: Portable semi upright AP view 2130 hours.  Stable lung volumes.  Cardiac size and mediastinal contours are within normal limits.  Visualized tracheal air column is within normal limits. No pneumothorax, pulmonary edema, pleural effusion or acute pulmonary opacity.  IMPRESSION: No acute cardiopulmonary abnormality.  Original Report Authenticated By: Ulla Potash III, M.D.    ROS: General:has sinus congestion recently secondary to moving barn items, without a mask. Skin:no rashes or ulcers HEENT:no blurred vision no double vision WU:JWJXB pressure associated with left arm discomfort mild diaphoresis and occasional shortness of breath JYN:WGNF shortness of breath with exertion while in a flutter, no history of asthma or emphysema GI:no indigestion no diarrhea no constipation no melena GU:no hematuria and no dysuria MS:no joint pain or back pain Neuro:no syncope, some dizziness and lightheadedness with a flutter Endo:no diabetes no thyroid disease   Blood pressure 114/79, pulse 73, temperature 98.5 F (36.9 C), temperature source Oral, resp. rate 12, height 6\' 2"  (1.88 m), weight 122.3 kg (269 lb 10 oz), SpO2 95.00%. PE: General:alert, oriented, white male no acute distress, pleasant affect. Wife is in the room with him. Skin:warm and dry, brisk capillary refill HEENT:normocephalic, sclera clear. Neck:supple no JVD no bruits Heart:S1-S2 irregularly  Irregular no obvious murmur gallop rub or click Lungs:clear without rales rhonchi or wheezes AOZ:HYQMVHQI bowel sounds  do not palpate liver spleen or masses nontender Ext:no edema 2+ posterior tibs bilaterally Neuro:alert and oriented x3, is all extremities, follows commands.    Assessment/Plan Patient Active Problem List  Diagnoses  . HYPERTENSION  . BRONCHOSPASM  . Atrial flutter with rapid ventricular response   PLAN: unsure exactly when the patient went into atrial flutter could have been as far back as Friday.  We will continue his 1 mg per kilo Lovenox for now with plans for TEE cardioversion on Friday.  TSH is pending.  Please see Dr. Blanchie Dessert notes for further recommendations.  Currently on Cardizem 60 mg by mouth every 8 hours and this is controlling his heart rate currently. His IV Cardizem has been DC'd.  2-D echocardiogram has been ordered as well.  Cardiac enzymes are negative currently.  Pro BNP was elevated though the chest x-ray was clear and the patient does not have any rales on exam.  Emmett Arntz R 02/04/2012, 9:02 AM

## 2012-02-04 NOTE — Discharge Summary (Signed)
Family Medicine Resident Discharge Summary  Patient ID: [name] [id number] 65 y.o. 11/22/1946  Admit date: 02/03/2012  Discharge date and time: 02/04/12  Admitting Physician: Carlota Raspberry, MD   Discharge Physician: Oda Cogan, MD  Admission Diagnoses: Atrial flutter with rapid ventricular response [427.32] heart rate elevated  Discharge Diagnoses: Atrial flutter w/ RVR  Admission Condition: good  Discharged Condition: good  Indication for Admission: New onset atrial flutter w/ RVR  Hospital Course: 65yo previously healthy male w/ new onset atrial flutter w/ RVR on Diltiazem 360mg  extended release at home for BP control  Atrial flutter: Pt found to be in flutter on admission by EKG. Pt initially rate controlled w/ diltiazem 10mg  injection followed by drip at 5mg /hr. Pt transitioned to Dilt PO 60mg  TID later that day and DC on 240mg  extended release Qday prescription w/ [facility] for anticoagulation. Cardiology consulted and planned to see pt again on Friday for cardioversion. Pt w/o CP, SOB, or feelings of palpitations during admission. Lab/study results as below:   Of note pt on Diltiazem for BP control due to severe allergies to lisinopril, b-blockers, and pt stating other allergies but unable to recall exactly which ones. Pt reports angioedema w/ b-blockers. Pt to bring full allergy list to cardiologist on Friday.   Consults: cardiology  Significant Diagnostic Studies:   Comprehensive Metabolic Panel:    Component Value Date/Time   NA 140 02/03/2012 2100   K 4.1 02/03/2012 2100   CL 104 02/03/2012 2100   CO2 27 02/03/2012 2100   BUN 23 02/03/2012 2100   CREATININE 1.20 02/03/2012 2100   GLUCOSE 150* 02/03/2012 2100   CALCIUM 9.4 02/03/2012 2100   AST 24 02/03/2012 2100   ALT 35 02/03/2012 2100   ALKPHOS 72 02/03/2012 2100   BILITOT 0.2* 02/03/2012 2100   PROT 6.9 02/03/2012 2100   ALBUMIN 3.7 02/03/2012 2100   Lab Results  Component Value Date   CKTOTAL 152  02/04/2012   CKMB 5.8* 02/04/2012   TROPONINI <0.30 02/04/2012  CKMB initally 7.1 on admission  Lipid Panel     Component Value Date/Time   CHOL 161 02/04/2012 0624   TRIG 148 02/04/2012 0624   HDL 34* 02/04/2012 0624   CHOLHDL 4.7 02/04/2012 0624   VLDL 30 02/04/2012 0624   LDLCALC 97 02/04/2012 0624   INR/Prothrombin Time: 1.07/14.1 Hgb A1c 5.9 TSH 1.980 Free T4 1.18  EKG consistent w/ Atrial flutter. No ischemic changes  Pro BNP: 869.2   Discharge Exam:  Gen: NAD CV: Irregularly rate and rhythm, no m/r/g, 2+ pulses throughout Res: CTAB, normal effort Ext: No edema Neuro: CN grossly intact.    Disposition: home  Patient Instructions:  Current Discharge Medication List    START taking these medications   Details  diltiazem (CARDIZEM CD) 240 MG 24 hr capsule Take 1 capsule (240 mg total) by mouth daily. Qty: 30 capsule, Refills: 3    rivaroxaban 20 MG TABS Take 20 mg by mouth daily. Qty: 12 tablet, Refills: 0      STOP taking these medications     diltiazem (MATZIM LA) 360 MG 24 hr tablet Comments:  Reason for Stopping:         Activity: no strenuous exercise until f/u w/ cardiology on Friday Diet: regular diet Wound Care: none needed  Follow-up with Dr. Rennis Golden on Friday the 22nd at 12:00 for TEE and possible cardioversion.   Follow-up Items: 1. Atrial Flutter 2. BP control w/ numerous allergies to BP medications  Signed: Shelly Flatten, MD Family Medicine Resident PGY-1 02/04/2012 11:56 AM

## 2012-02-05 NOTE — H&P (Signed)
Discussed with Dr. Cristal Ford and agree with her documentation and plan.  This patient was seen by Dr. Deirdre Priest as attending.  Please see his note.

## 2012-02-05 NOTE — Discharge Summary (Signed)
I have reviewed this discharge summary and agree.    

## 2012-02-06 ENCOUNTER — Encounter (HOSPITAL_COMMUNITY): Payer: Self-pay | Admitting: Certified Registered"

## 2012-02-06 ENCOUNTER — Encounter (HOSPITAL_COMMUNITY)
Admission: RE | Disposition: A | Discharge status: Home / Self Care | Payer: Self-pay | Source: Ambulatory Visit | Attending: Internal Medicine

## 2012-02-06 ENCOUNTER — Other Ambulatory Visit: Payer: Self-pay | Admitting: Emergency Medicine

## 2012-02-06 ENCOUNTER — Ambulatory Visit (HOSPITAL_COMMUNITY)
Admission: RE | Admit: 2012-02-06 | Discharge: 2012-02-06 | Disposition: A | Discharge status: Home / Self Care | Payer: 59 | Source: Ambulatory Visit | Attending: Internal Medicine | Admitting: Internal Medicine

## 2012-02-06 DIAGNOSIS — I4891 Unspecified atrial fibrillation: Secondary | ICD-10-CM | POA: Insufficient documentation

## 2012-02-06 DIAGNOSIS — Z538 Procedure and treatment not carried out for other reasons: Secondary | ICD-10-CM | POA: Insufficient documentation

## 2012-02-06 SURGERY — CARDIOVERSION
Anesthesia: General

## 2012-02-06 SURGERY — ECHOCARDIOGRAM, TRANSESOPHAGEAL
Anesthesia: Moderate Sedation

## 2012-02-06 MED ORDER — HYDROCORTISONE 1 % EX CREA: 1.0000 "application " | TOPICAL_CREAM | Freq: Three times a day (TID) | CUTANEOUS | Status: DC | PRN

## 2012-02-06 MED ORDER — SODIUM CHLORIDE 0.9 % IJ SOLN: 3.0000 mL | INTRAMUSCULAR | Status: DC | PRN

## 2012-02-06 NOTE — H&P (Signed)
[facility]          INTERVAL PROCEDURE H&P   History and Physical Interval Note:  02/06/2012 12:42 PM  [name] has presented today for their planned procedure. The various methods of treatment have been discussed with the patient and family. After consideration of risks, benefits and other options for treatment, the patient has consented to the procedure.  In the endoscopy suite, [name] was noted to be in sinus rhythm with occasional PVC's, therefore, TEE/Cardioversion is not necessary.  He continues to take Xarelto without difficulty. He did break through with RVR on diltiazem and I recommended adding B-blocker to his regimen.  This was not continued by [facility] on discharge.  I have given him an Rx for Toprol XL 12.5 mg daily today. He can be discharged home today after a 12 lead EKG and follow-up with me at our planned appointment in 2 weeks.  Chrystie Nose, MD, [facility] Attending Cardiologist [facility]  Nastacia Raybuck C 02/06/2012, 12:42 PM

## 2012-02-06 NOTE — OR Nursing (Signed)
Pt presented for TEE/cardioversion and was noted to be in sinus rhythm.  Dr Rennis Golden was notified and ordered an EKG which confirmed the pt was in sinus rhythm.  Procedure was canceled and patient was discharged.

## 2012-02-06 NOTE — Telephone Encounter (Signed)
Called pt to make sure both his medication RF and allergy list have been taken care of, and pt stated it has all been done and he has appt w/Dr Cleta Alberts on the 26th.

## 2012-03-05 ENCOUNTER — Encounter: Payer: Self-pay | Admitting: *Deleted

## 2012-03-09 ENCOUNTER — Ambulatory Visit (INDEPENDENT_AMBULATORY_CARE_PROVIDER_SITE_OTHER): Payer: 59 | Admitting: Emergency Medicine

## 2012-03-09 ENCOUNTER — Encounter: Payer: Self-pay | Admitting: Emergency Medicine

## 2012-03-09 VITALS — BP 138/72 | HR 65 | Temp 98.1°F | Resp 12 | Ht 73.0 in | Wt 269.0 lb

## 2012-03-09 DIAGNOSIS — I10 Essential (primary) hypertension: Secondary | ICD-10-CM

## 2012-03-09 DIAGNOSIS — I4892 Unspecified atrial flutter: Secondary | ICD-10-CM

## 2012-03-09 DIAGNOSIS — I4891 Unspecified atrial fibrillation: Secondary | ICD-10-CM

## 2012-03-09 DIAGNOSIS — Z0000 Encounter for general adult medical examination without abnormal findings: Secondary | ICD-10-CM

## 2012-03-09 LAB — POCT URINALYSIS DIPSTICK
Bilirubin, UA: NEGATIVE
Glucose, UA: NEGATIVE
Nitrite, UA: NEGATIVE
Spec Grav, UA: 1.02
Urobilinogen, UA: 0.2

## 2012-03-09 LAB — CBC WITH DIFFERENTIAL/PLATELET
Eosinophils Absolute: 0.1 10*3/uL (ref 0.0–0.7)
Hemoglobin: 15.4 g/dL (ref 13.0–17.0)
Lymphocytes Relative: 27 % (ref 12–46)
Lymphs Abs: 1.7 10*3/uL (ref 0.7–4.0)
MCH: 31.9 pg (ref 26.0–34.0)
MCV: 90.1 fL (ref 78.0–100.0)
Monocytes Relative: 10 % (ref 3–12)
Neutrophils Relative %: 61 % (ref 43–77)
Platelets: 275 10*3/uL (ref 150–400)
RBC: 4.83 MIL/uL (ref 4.22–5.81)
WBC: 6.2 10*3/uL (ref 4.0–10.5)

## 2012-03-09 LAB — COMPREHENSIVE METABOLIC PANEL
ALT: 33 U/L (ref 0–53)
Albumin: 4.5 g/dL (ref 3.5–5.2)
CO2: 29 mEq/L (ref 19–32)
Calcium: 9.5 mg/dL (ref 8.4–10.5)
Chloride: 105 mEq/L (ref 96–112)
Glucose, Bld: 111 mg/dL — ABNORMAL HIGH (ref 70–99)
Sodium: 142 mEq/L (ref 135–145)
Total Protein: 7.2 g/dL (ref 6.0–8.3)

## 2012-03-09 LAB — LIPID PANEL
Cholesterol: 181 mg/dL (ref 0–200)
Triglycerides: 130 mg/dL (ref <150)

## 2012-03-09 LAB — PSA: PSA: 0.74 ng/mL (ref <=4.00)

## 2012-03-09 NOTE — Progress Notes (Signed)
  Subjective:    Patient ID: [name], male    DOB: Mar 30, 1946, 66 y.o.   MRN: [id number]  HPI patient enters for his yearly physical exam. Has been stable except for an episode of atrial flutter which required evaluation by the cardiologist. This was felt to be secondary to low in A. fib he was not felt to have any evidence of cardiac disease. He is under the care of Dr. Rennis Golden .    Review of Systems  Constitutional: Negative.   HENT: Negative.   Eyes: Negative.   Respiratory: Negative.   Cardiovascular: Negative.   Gastrointestinal: Negative.   Genitourinary: Negative.   Musculoskeletal: Negative.   Neurological: Negative.   Hematological: Negative.   Psychiatric/Behavioral: Negative.        Objective:   Physical Exam  Constitutional: He appears well-developed and well-nourished.  HENT:  Right Ear: External ear normal.  Left Ear: External ear normal.  Eyes: Pupils are equal, round, and reactive to light.  Neck: Neck supple. No JVD present. No tracheal deviation present. No thyromegaly present.  Cardiovascular: Normal rate, regular rhythm and normal heart sounds.  Exam reveals no gallop and no friction rub.   No murmur heard. Pulmonary/Chest: Breath sounds normal. No respiratory distress. He has no wheezes. He has no rales. He exhibits no tenderness.  Abdominal: Soft. He exhibits no distension. There is no tenderness. There is no rebound and no guarding.  Genitourinary: Prostate normal.  Musculoskeletal: He exhibits no edema and no tenderness.  Lymphadenopathy:    He has no cervical adenopathy.  Neurological: He is alert. He has normal reflexes. No cranial nerve deficit. Coordination normal.  Skin: Skin is warm and dry.          Assessment & Plan:   Patient here for his yearly physical exam. He is doing great taking his blood pressure medications. He had an episode of atrial flutter which has subsequently resolved. He continues under the care of Dr. Rennis Golden. He  is due for colonoscopy and wants me to schedule this. He does not want to take shingles vaccine.

## 2012-03-11 ENCOUNTER — Encounter: Payer: Self-pay | Admitting: Radiology

## 2012-03-18 ENCOUNTER — Encounter: Payer: Self-pay | Admitting: Gastroenterology

## 2012-03-22 ENCOUNTER — Encounter: Payer: Self-pay | Admitting: Gastroenterology

## 2012-04-05 ENCOUNTER — Telehealth: Payer: Self-pay | Admitting: Gastroenterology

## 2012-04-05 NOTE — Telephone Encounter (Signed)
lmom for pt to call back

## 2012-04-06 NOTE — Telephone Encounter (Signed)
Spoke with pt who stated he is scheduled for his Recall Colon on 05/03/12. He was placed on Xarelto for 3 months and he should be off by the procedure. Pt will call Dr Rennis Golden and check on the Xarelto stoppage and let us know. He will ask Dr Rennis Golden how long he should wait for a procedure if he remains on the med.

## 2012-04-19 ENCOUNTER — Telehealth: Payer: Self-pay | Admitting: *Deleted

## 2012-04-19 NOTE — Telephone Encounter (Signed)
Pt. Has had recent episode of Atrial Flutter and is on Xarelto for 6 months.  He prefers to wait until his Xarelto is DC'd before having colon.  He will call and [facility] his colon in September.

## 2012-04-27 ENCOUNTER — Ambulatory Visit: Payer: 59

## 2012-04-27 ENCOUNTER — Ambulatory Visit (INDEPENDENT_AMBULATORY_CARE_PROVIDER_SITE_OTHER): Payer: 59 | Admitting: Family Medicine

## 2012-04-27 VITALS — BP 132/70 | HR 68 | Temp 98.0°F | Resp 18 | Ht 74.0 in | Wt 270.0 lb

## 2012-04-27 DIAGNOSIS — J3489 Other specified disorders of nose and nasal sinuses: Secondary | ICD-10-CM

## 2012-04-27 DIAGNOSIS — G501 Atypical facial pain: Secondary | ICD-10-CM

## 2012-04-27 DIAGNOSIS — J340 Abscess, furuncle and carbuncle of nose: Secondary | ICD-10-CM

## 2012-04-27 LAB — POCT CBC
Lymph, poc: 2.1 (ref 0.6–3.4)
MCH, POC: 32.6 pg — AB (ref 27–31.2)
MCHC: 35 g/dL (ref 31.8–35.4)
MCV: 92.9 fL (ref 80–97)
MID (cbc): 0.5 (ref 0–0.9)
POC LYMPH PERCENT: 29.6 %L (ref 10–50)
POC MID %: 6.8 %M (ref 0–12)
Platelet Count, POC: 280 10*3/uL (ref 142–424)
RBC: 4.27 M/uL — AB (ref 4.69–6.13)
RDW, POC: 13.2 %
WBC: 7.2 10*3/uL (ref 4.6–10.2)

## 2012-04-27 MED ORDER — AMOXICILLIN-POT CLAVULANATE 875-125 MG PO TABS: 1.0000 | ORAL_TABLET | Freq: Two times a day (BID) | ORAL | Status: AC

## 2012-04-27 NOTE — Progress Notes (Signed)
Patient ID: [name] MRN: [id number], DOB: Oct 24, 1946, 66 y.o. Date of Encounter: 04/27/2012, 12:28 PM  Primary Physician: Lucilla Edin, MD, MD  Chief Complaint: Nose pain for 4 days  HPI: 66 y.o. year old male with history below presents with nose pain for 4 days. No trauma or injury. Pain is located along the bridge of the nose. No congestion, rhinorrhea, or epistaxis. Afebrile. Pain began on the right side initially, then spread to include the nasal bridge. No sinus pressure, post nasal drip, headache, ST, or cough. Able to breathe without restriction through the nose. Normal vision without discharge. He is otherwise doing well without issues or complaints.   He is followed by Dr. Rennis Golden for his atrial flutter.  Past Medical History  Diagnosis Date  . Hypertension   . Anxiety   . Pneumonia   . RAD (reactive airway disease)   . Allergic angioedema   . Atrial flutter      Home Meds: Prior to Admission medications   Medication Sig Start Date End Date Taking? Authorizing Provider  diltiazem (CARDIZEM CD) 240 MG 24 hr capsule Take 1 capsule (240 mg total) by mouth daily. 02/04/12 02/03/13 Yes Ozella Rocks, MD  metoprolol tartrate (LOPRESSOR) 25 MG tablet Take 25 mg by mouth 2 (two) times daily.   Yes Historical Provider, MD  olmesartan-hydrochlorothiazide (BENICAR HCT) 40-12.5 MG per tablet Take 1 tablet by mouth daily.   Yes Historical Provider, MD  PRESCRIPTION MEDICATION Take 20 mg by mouth daily. Patient takes Xavetto   Yes Historical Provider, MD  rivaroxaban 20 MG TABS Take 20 mg by mouth daily. 02/04/12  Yes Ozella Rocks, MD    Allergies:  Allergies  Allergen Reactions  . Ibuprofen Swelling  . Lisinopril Swelling    Swelling of face and lips   . Oxaprozin Swelling    Swelling of face and lips  . Ziac (Bisoprolol-Hydrochlorothiazide) Swelling    Per patient records from Dr. Herb Grays.  . Bystolic (Nebivolol Hcl) Itching  . Hctz (Hydrochlorothiazide)     . Norvasc (Amlodipine Besylate)     History   Social History  . Marital Status: Married    Spouse Name: N/A    Number of Children: N/A  . Years of Education: N/A   Occupational History  . Not on file.   Social History Main Topics  . Smoking status: Former Games developer  . Smokeless tobacco: Former Neurosurgeon    Quit date: 02/04/1996  . Alcohol Use: No  . Drug Use: No  . Sexually Active: Not on file   Other Topics Concern  . Not on file   Social History Narrative  . No narrative on file     Review of Systems: Constitutional: negative for chills, fever, night sweats, weight changes, or fatigue  HEENT: negative for vision changes, hearing loss, congestion, rhinorrhea, ST, epistaxis, or sinus pressure Cardiovascular: negative for chest pain or palpitations Respiratory: negative for hemoptysis, wheezing, shortness of breath, or cough Abdominal: negative for abdominal pain, nausea, vomiting, diarrhea, or constipation Dermatological: negative for rash Neurologic: negative for headache, dizziness, or syncope All other systems reviewed and are otherwise negative with the exception to those above and in the HPI.   Physical Exam: Blood pressure 132/70, pulse 68, temperature 98 F (36.7 C), temperature source Oral, resp. rate 18, height 6\' 2"  (1.88 m), weight 270 lb (122.471 kg), SpO2 97.00%., Body mass index is 34.67 kg/(m^2). General: Well developed, well nourished, in no acute distress. Head: Normocephalic,  atraumatic, eyes without discharge, no pain with movement, sclera non-icteric, nares are without discharge, epistaxis, or erythema. MMM. Nasal bridge/ethmoid TTP along top and both sides. No TTP along tip of the nose. No STS, lesion, wound, or erythema of the nose or surrounding tissue. No frontal, or maxillary sinus TTP. Bilateral auditory canals clear, TM's are without perforation, pearly grey and translucent with reflective cone of light bilaterally. Oral cavity moist, posterior pharynx  without exudate, erythema, peritonsillar abscess, or post nasal drip.  Neck: Supple. No thyromegaly. Full ROM. No lymphadenopathy. Lungs: Clear bilaterally to auscultation without wheezes, rales, or rhonchi. Breathing is unlabored. Heart: RRR with S1 S2. No murmurs, rubs, or gallops appreciated. Msk:  Strength and tone normal for age. Extremities/Skin: Warm and dry. No clubbing or cyanosis. No edema. No rashes or suspicious lesions. Neuro: Alert and oriented X 3. Moves all extremities spontaneously. Gait is normal. CNII-XII grossly in tact. Psych:  Responds to questions appropriately with a normal affect.   Labs: Results for orders placed in visit on 04/27/12  POCT CBC      Component Value Range   WBC 7.2  4.6 - 10.2 (K/uL)   Lymph, poc 2.1  0.6 - 3.4    POC LYMPH PERCENT 29.6  10 - 50 (%L)   MID (cbc) 0.5  0 - 0.9    POC MID % 6.8  0 - 12 (%M)   POC Granulocyte 4.6  2 - 6.9    Granulocyte percent 63.6  37 - 80 (%G)   RBC 4.27 (*) 4.69 - 6.13 (M/uL)   Hemoglobin 13.9 (*) 14.1 - 18.1 (g/dL)   HCT, POC 16.1 (*) 09.6 - 53.7 (%)   MCV 92.9  80 - 97 (fL)   MCH, POC 32.6 (*) 27 - 31.2 (pg)   MCHC 35.0  31.8 - 35.4 (g/dL)   RDW, POC 04.5     Platelet Count, POC 280  142 - 424 (K/uL)   MPV 8.1  0 - 99.8 (fL)     [facility] reading (PRIMARY) by  Dr. Patsy Lager. negative   ASSESSMENT AND PLAN:  66 y.o. year old male with possible cellulitis of the nare -Augmentin 875/125 mg #20 1 po bid no RF -Call with update 48 hours, if worsening RTC -If no better plan to CT -RTC precautions  Signed, Eula Listen, PA-C 04/27/2012 12:28 PM

## 2012-05-03 ENCOUNTER — Encounter: Payer: 59 | Admitting: Gastroenterology

## 2012-05-31 ENCOUNTER — Other Ambulatory Visit (HOSPITAL_COMMUNITY): Payer: Self-pay | Admitting: Family Medicine

## 2012-05-31 NOTE — Telephone Encounter (Signed)
Okay to refill Cardizem CD 240 one a day #30 refill x1

## 2012-06-01 MED ORDER — DILTIAZEM HCL ER COATED BEADS 240 MG PO CP24: 240.0000 mg | ORAL_CAPSULE | Freq: Every day | ORAL | Status: DC

## 2012-06-01 NOTE — Addendum Note (Signed)
Addended by: Jacqualyn Posey on: 06/01/2012 06:40 PM   Modules accepted: Orders

## 2012-06-02 ENCOUNTER — Other Ambulatory Visit (HOSPITAL_COMMUNITY): Payer: Self-pay | Admitting: Family Medicine

## 2012-07-27 ENCOUNTER — Other Ambulatory Visit (HOSPITAL_COMMUNITY): Payer: Self-pay | Admitting: Emergency Medicine

## 2012-09-07 ENCOUNTER — Ambulatory Visit (INDEPENDENT_AMBULATORY_CARE_PROVIDER_SITE_OTHER): Payer: 59 | Admitting: Emergency Medicine

## 2012-09-07 ENCOUNTER — Encounter: Payer: Self-pay | Admitting: Emergency Medicine

## 2012-09-07 VITALS — BP 138/70 | HR 68 | Temp 98.6°F | Resp 16 | Ht 73.0 in | Wt 272.8 lb

## 2012-09-07 DIAGNOSIS — J301 Allergic rhinitis due to pollen: Secondary | ICD-10-CM

## 2012-09-07 DIAGNOSIS — I4892 Unspecified atrial flutter: Secondary | ICD-10-CM

## 2012-09-07 DIAGNOSIS — I10 Essential (primary) hypertension: Secondary | ICD-10-CM

## 2012-09-07 DIAGNOSIS — T7840XA Allergy, unspecified, initial encounter: Secondary | ICD-10-CM

## 2012-09-07 NOTE — Progress Notes (Signed)
  Subjective:    Patient ID: [name], male    DOB: Dec 24, 1945, 66 y.o.   MRN: [id number]  Hypertension This is a chronic problem. The problem has been gradually improving since onset. The problem is controlled. Pertinent negatives include no anxiety or palpitations.      Review of Systems  Cardiovascular: Negative for palpitations.  Problems with rash and swelling around his eyes     Objective:   Physical Exam  Constitutional: He appears well-developed.  HENT:  Head: Normocephalic.  Eyes: Pupils are equal, round, and reactive to light.       Puffiness around both eyes  Neck: Normal range of motion.  Cardiovascular: Normal rate and regular rhythm.  Exam reveals no gallop and no friction rub.   No murmur heard. Pulmonary/Chest: Effort normal and breath sounds normal.  Skin:       Redness at nape of neck . No hives          Assessment & Plan:  Question allergy to ARB/HCTZ. Start clariten. Stop Benicar/hctz. Call Dr. Rennis Golden

## 2012-10-05 ENCOUNTER — Encounter: Payer: Self-pay | Admitting: Gastroenterology

## 2012-10-05 ENCOUNTER — Ambulatory Visit (AMBULATORY_SURGERY_CENTER): Payer: 59

## 2012-10-05 ENCOUNTER — Other Ambulatory Visit (HOSPITAL_COMMUNITY): Payer: Self-pay | Admitting: Physician Assistant

## 2012-10-05 VITALS — Ht 73.5 in | Wt 273.8 lb

## 2012-10-05 DIAGNOSIS — K5730 Diverticulosis of large intestine without perforation or abscess without bleeding: Secondary | ICD-10-CM

## 2012-10-05 DIAGNOSIS — K5790 Diverticulosis of intestine, part unspecified, without perforation or abscess without bleeding: Secondary | ICD-10-CM

## 2012-10-05 DIAGNOSIS — Z1211 Encounter for screening for malignant neoplasm of colon: Secondary | ICD-10-CM

## 2012-10-05 MED ORDER — MOVIPREP 100 G PO SOLR: ORAL | Status: DC

## 2012-10-19 ENCOUNTER — Telehealth: Payer: Self-pay

## 2012-10-19 MED ORDER — EPINEPHRINE 0.3 MG/0.3ML IJ DEVI: 0.3000 mg | Freq: Once | INTRAMUSCULAR | Status: DC

## 2012-10-19 NOTE — Telephone Encounter (Signed)
I have sent Epi-Pen to the pharmacy.

## 2012-10-19 NOTE — Telephone Encounter (Signed)
Have pended Rx, advise.

## 2012-10-19 NOTE — Telephone Encounter (Signed)
Patient has an out dated Epi Pen and would like to know if he could have another one called in. He is going out of town.  Please call [phone]  [facility]

## 2012-10-27 ENCOUNTER — Encounter: Payer: Self-pay | Admitting: Gastroenterology

## 2012-10-27 ENCOUNTER — Ambulatory Visit (AMBULATORY_SURGERY_CENTER): Payer: 59 | Admitting: Gastroenterology

## 2012-10-27 VITALS — BP 120/68 | HR 61 | Temp 98.6°F | Resp 23

## 2012-10-27 DIAGNOSIS — Z1211 Encounter for screening for malignant neoplasm of colon: Secondary | ICD-10-CM

## 2012-10-27 DIAGNOSIS — K5730 Diverticulosis of large intestine without perforation or abscess without bleeding: Secondary | ICD-10-CM

## 2012-10-27 MED ORDER — SODIUM CHLORIDE 0.9 % IV SOLN: 500.0000 mL | INTRAVENOUS | Status: DC

## 2012-10-27 NOTE — Patient Instructions (Signed)
Moderate diverticulosis.  High fiber diet recommended as well as benefiber or metamucil. Recall colonoscopy in 10 years.  YOU HAD AN ENDOSCOPIC PROCEDURE TODAY AT THE [facility]: Refer to the procedure report that was given to you for any specific questions about what was found during the examination.  If the procedure report does not answer your questions, please call your gastroenterologist to clarify.  If you requested that your care partner not be given the details of your procedure findings, then the procedure report has been included in a sealed envelope for you to review at your convenience later.  YOU SHOULD EXPECT: Some feelings of bloating in the abdomen. Passage of more gas than usual.  Walking can help get rid of the air that was put into your GI tract during the procedure and reduce the bloating. If you had a lower endoscopy (such as a colonoscopy or flexible sigmoidoscopy) you may notice spotting of blood in your stool or on the toilet paper. If you underwent a bowel prep for your procedure, then you may not have a normal bowel movement for a few days.  DIET: Your first meal following the procedure should be a light meal and then it is ok to progress to your normal diet.  A half-sandwich or bowl of soup is an example of a good first meal.  Heavy or fried foods are harder to digest and may make you feel nauseous or bloated.  Likewise meals heavy in dairy and vegetables can cause extra gas to form and this can also increase the bloating.  Drink plenty of fluids but you should avoid alcoholic beverages for 24 hours.  ACTIVITY: Your care partner should take you home directly after the procedure.  You should plan to take it easy, moving slowly for the rest of the day.  You can resume normal activity the day after the procedure however you should NOT DRIVE or use heavy machinery for 24 hours (because of the sedation medicines used during the test).    SYMPTOMS TO REPORT  IMMEDIATELY: A gastroenterologist can be reached at any hour.  During normal business hours, 8:30 AM to 5:00 PM Monday through Friday, call [phone].  After hours and on weekends, please call the GI answering service at [phone] who will take a message and have the physician on call contact you.   Following lower endoscopy (colonoscopy or flexible sigmoidoscopy):  Excessive amounts of blood in the stool  Significant tenderness or worsening of abdominal pains  Swelling of the abdomen that is new, acute  Fever of 100F or higher   FOLLOW UP: If any biopsies were taken you will be contacted by phone or by letter within the next 1-3 weeks.  Call your gastroenterologist if you have not heard about the biopsies in 3 weeks.  Our staff will call the home number listed on your records the next business day following your procedure to check on you and address any questions or concerns that you may have at that time regarding the information given to you following your procedure. This is a courtesy call and so if there is no answer at the home number and we have not heard from you through the emergency physician on call, we will assume that you have returned to your regular daily activities without incident.  SIGNATURES/CONFIDENTIALITY: You and/or your care partner have signed paperwork which will be entered into your electronic medical record.  These signatures attest to the fact that that the information  above on your After Visit Summary has been reviewed and is understood.  Full responsibility of the confidentiality of this discharge information lies with you and/or your care-partner.  

## 2012-10-27 NOTE — Op Note (Signed)
[facility] [address] [place], [address]   COLONOSCOPY PROCEDURE REPORT  PATIENT: [name], [name]  MR#: [id number] BIRTHDATE: 01-01-1946 , 65  yrs. old GENDER: Male ENDOSCOPIST: Mardella Layman, MD, [facility] REFERRED BY: PROCEDURE DATE:  10/27/2012 PROCEDURE:   Colonoscopy, screening ASA CLASS: INDICATIONS:average risk patient for colon cancer. MEDICATIONS: propofol (Diprivan) 200mg  IV  DESCRIPTION OF PROCEDURE:   After the risks and benefits and of the procedure were explained, informed consent was obtained.  A digital rectal exam revealed no abnormalities of the rectum.    The LB CF-[id number]  endoscope was introduced through the anus and advanced to the cecum, which was identified by both the appendix and ileocecal valve .  The quality of the prep was excellent, using MoviPrep .  The instrument was then slowly withdrawn as the colon was fully examined.     COLON FINDINGS: Moderate diverticulosis was noted in the descending colon and sigmoid colon.   A normal appearing cecum, ileocecal valve, and appendiceal orifice were identified.  The ascending, hepatic flexure, transverse, splenic flexure, descending, sigmoid colon and rectum appeared unremarkable.  No polyps or cancers were seen.     Retroflexed views revealed no abnormalities.     The scope was then withdrawn from the patient and the procedure completed.  COMPLICATIONS: There were no complications. ENDOSCOPIC IMPRESSION: 1.   Moderate diverticulosis was noted in the descending colon and sigmoid colon 2.   Normal colon ,,,no poltps or cancer noted.  RECOMMENDATIONS: 1.  High fiber diet 2.  Metamucil or benefiber 3.  Continue current colorectal screening recommendations for "routine risk" patients with a repeat colonoscopy in 10 years.   REPEAT EXAM:  XB:JYNWGN Daub, MD  _______________________________ eSigned:  Mardella Layman, MD, [facility] 10/27/2012 12:12 PM     PATIENT  NAME:  [name], [name] MR#: [id number]

## 2012-10-27 NOTE — Progress Notes (Signed)
Patient did not experience any of the following events: a burn prior to discharge; a fall within the facility; wrong site/side/patient/procedure/implant event; or a hospital transfer or hospital admission upon discharge from the facility. (G8907) Patient did not have preoperative order for IV antibiotic SSI prophylaxis. (G8918)  

## 2012-10-28 ENCOUNTER — Telehealth: Payer: Self-pay | Admitting: *Deleted

## 2012-10-28 NOTE — Telephone Encounter (Signed)
  Follow up Call-  Call back number 10/27/2012  Post procedure Call Back phone  # [phone] NO MACHINE OR [phone] MAY LEAVE MESSAGE  Permission to leave phone message Yes     No answer,Left message on machine. Both #'s called today.

## 2012-11-08 ENCOUNTER — Ambulatory Visit (INDEPENDENT_AMBULATORY_CARE_PROVIDER_SITE_OTHER): Payer: 59 | Admitting: Emergency Medicine

## 2012-11-08 ENCOUNTER — Other Ambulatory Visit: Payer: Self-pay | Admitting: *Deleted

## 2012-11-08 VITALS — BP 165/77 | HR 85 | Temp 99.0°F | Resp 17 | Ht 73.0 in | Wt 275.0 lb

## 2012-11-08 DIAGNOSIS — Z87898 Personal history of other specified conditions: Secondary | ICD-10-CM | POA: Insufficient documentation

## 2012-11-08 DIAGNOSIS — T7840XA Allergy, unspecified, initial encounter: Secondary | ICD-10-CM

## 2012-11-08 DIAGNOSIS — T783XXA Angioneurotic edema, initial encounter: Secondary | ICD-10-CM

## 2012-11-08 DIAGNOSIS — I10 Essential (primary) hypertension: Secondary | ICD-10-CM

## 2012-11-08 DIAGNOSIS — Z888 Allergy status to other drugs, medicaments and biological substances status: Secondary | ICD-10-CM

## 2012-11-08 DIAGNOSIS — D559 Anemia due to enzyme disorder, unspecified: Secondary | ICD-10-CM

## 2012-11-08 MED ORDER — METOPROLOL SUCCINATE ER 50 MG PO TB24: ORAL_TABLET | ORAL | Status: DC

## 2012-11-08 NOTE — Progress Notes (Signed)
  Subjective:    Patient ID: [name], male    DOB: 1946/10/14, 66 y.o.   MRN: [id number]  HPI Patient comes into our office to day with an allergic reaction  Patient noticed rash Saturday Patient face is red with a rash nose,lips and nose swollen hasn't taken,eatting or used anything new Has had some itching on his head,back and arms Patient has a complicated history with a history of multiple drug allergies. He has not tolerated hardly any blood pressure medications. He's had allergy testing to the allergist with findings of only sensitivities to dust mold and mildew.    Review of Systems  HENT: Positive for facial swelling.   Respiratory: Negative for chest tightness, shortness of breath and wheezing.        Objective:   Physical Exam patient is somewhat flushed but otherwise alert and cooperative. His neck is supple his chest was clear. his cardiac exam is regular rate without murmurs. there are no hives noted on exam. There are no wheezes noted on respiratory exam. She does not have any swelling of his lips or eyelids or tongue at the present time. His posterior pharynx revealed no swelling of the uvula.        Assessment & Plan:  The patient has had recurrent angioedema and  flushing episodes on medications. He has taken Toprol-XL for atrial flutter in the past and not had a reaction even though he has beta blockers on his list. I told him to stop his aspirin to take Zyrtec 10 mg a day to take Benadryl 1 to 2 at night. I started  him on Toprol-XL 50 take a half tablet a day and he will let us know if these allergic reactions continued . I would hope to be able to reinstitute his aspirin but I'm a little hesitant because of his unexplained episodes of flushing and facial swelling. He was instructed to carry an EpiPen with him at all times.

## 2012-11-08 NOTE — Patient Instructions (Addendum)
Please take Zyrtec 10 mg one a dayAngioedema Angioedema (AE) is a sudden swelling of the eyelids, lips, lobes of ears, external genitalia, skin, and other parts of the body. AE can happen by itself. It usually begins during the night and is found on awakening. It can happen with hives and other allergic reactions. Attacks can be mild and annoying, or life-threatening if the air passages swell. AE generally occurs in a short time period (over minutes to hours) and gets better in 24 to 48 hours. It usually does not cause any serious problems.  There are 2 different kinds of AE:   Allergic AE.  Nonallergic AE.  There may be an overreaction or direct stimulation of cells that are a part of the immune system (mast cells).  There may be problems with the release of chemicals made by the body that cause swelling and inflammation (kinins). AE due to kinins can be inherited from parents (hereditary), or it can develop on its own (acquired). Acquired AE either shows up before, or along with, certain diseases or is due to the body's immune system attacking parts of the body's own cells (autoimmune). CAUSES  Allergic  AE due to allergic reactions are caused by something that causes the body to react (trigger). Common triggers include:  Foods.  Medicines.  Latex.  Direct contact with certain fruits, vegetables, or animal saliva.  Insect stings. Nonallergic  Mast cell stimulation may be caused by:  Medicines.  Dyes used in X-rays.  The body's own immune system reactions to parts of the body (autoimmune disease).  Possibly, some virus infections.  AE due to problems with kinins can be hereditary or acquired. Attacks are triggered by:  Mild injury.  Dental work or any surgery.  Stress.  Sudden changes in temperature.  Exercise.  Medicines.  AE due to problems with kinins can also be due to certain medicines, especially blood pressure medicines like angiotensin-converting enzyme  (ACE) inhibitors. African Americans are at nearly 5 times greater risk of developing AE than Caucasians from ACE inhibitors. SYMPTOMS  Allergic symptoms:  Non-itchy swelling of the skin. Often the swelling is on the face and lips, but any area of the skin can swell. Sometimes, the swelling can be painful. If hives are present, there is intense itching.  Breathing problems if the air passages swell. Nonallergic symptoms:  If internal organs are involved, there may be:  Nausea.  Abdominal pain.  Vomiting.  Difficulty swallowing.  Difficulty passing urine.  Breathing problems if the air passages swell. Depending on the cause of AE, episodes may:  Only happen once (if triggers are removed or avoided).  Come back in unpredictable patterns.  Repeat for several years and then gradually fade away. DIAGNOSIS  AE is diagnosed by:   Asking questions to find out how fast the symptoms began.  Taking a family history.  Physical exam.  Diagnostic tests. Tests could include:  Allergy skin tests to see if the problem is allergic.  Blood tests to diagnose hereditary and some acquired types of AE.  Other tests to see if there is a hidden disease leading to the AE. TREATMENT  Treatment depends on the type and cause (if any) of the AE. Allergic  Allergic types of AE are treated with:  Immediate removal of the trigger or medicine (if any).  Epinephrine injection.  Steroids.  Antihistamines.  Hospitalization for severe attacks. Nonallergic  Mast cell stimulation types of AE are treated with:  Immediate removal of the trigger or  medicine (if any).  Epinephrine injection.  Steroids.  Antihistamines.  Hospitalization for severe attacks.  Hereditary AE is treated with:  Medicines to prevent and treat attacks. There is little response to antihistamines, epinephrine, or steroids.  Preventive medicines before dental work or surgery.  Removing or avoiding medicines  that trigger attacks.  Hospitalization for severe attacks.  Acquired AE is treated with:  Treating underlying disease (if any).  Medicines to prevent and treat attacks. HOME CARE INSTRUCTIONS   Always carry your emergency allergy treatment medicines with you.  Wear a medical bracelet.  Avoid known triggers. SEEK MEDICAL CARE IF:   You get repeat attacks.  Your attacks are more frequent or more severe despite preventive measures.  You have hereditary AE and are considering having children. It is important to discuss the risks of passing this on to your children. SEEK IMMEDIATE MEDICAL CARE IF:   You have difficulty breathing.  You have difficulty swallowing.  You experience fainting. This condition should be treated immediately. It can be life-threatening if it involves throat swelling. Document Released: 02/09/2002 Document Revised: 02/23/2012 Document Reviewed: 11/30/2008 [facility] Patient Information 2013 [place], [place].

## 2012-11-28 ENCOUNTER — Other Ambulatory Visit (HOSPITAL_COMMUNITY): Payer: Self-pay | Admitting: Physician Assistant

## 2012-12-14 ENCOUNTER — Telehealth: Payer: Self-pay

## 2012-12-14 NOTE — Telephone Encounter (Signed)
I called Mr. Gombos and he will increase his Toprol XL 50 to one a day

## 2012-12-14 NOTE — Telephone Encounter (Signed)
Pt would like to know if he should double his dose of metoprolol his blood pressure is running high would like a nurse to call him [phone]

## 2012-12-14 NOTE — Telephone Encounter (Signed)
160/90 to as high as 170/95, he is concerned about this.  He has multiple allergies, to BP meds see note from last OV :The patient has had recurrent angioedema and flushing episodes on medications. He has taken Toprol-XL for atrial flutter in the past and not had a reaction even though he has beta blockers on his list. I told him to stop his aspirin to take Zyrtec 10 mg a day to take Benadryl 1 to 2 at night. I started him on Toprol-XL 50 take a half tablet a day and he will let us know if these allergic reactions continued . I would hope to be able to reinstitute his aspirin but I'm a little hesitant because of his unexplained episodes of flushing and facial swelling. He was instructed to carry an EpiPen with him at all times.  He is taking the Toprol XL 50 mg 1/2 and tolerating this well, please advise.

## 2012-12-30 ENCOUNTER — Telehealth: Payer: Self-pay

## 2012-12-30 NOTE — Telephone Encounter (Signed)
Dr, daub    pts bp is 155/90  CBN: [phone]

## 2012-12-31 MED ORDER — METOPROLOL SUCCINATE ER 100 MG PO TB24: 100.0000 mg | ORAL_TABLET | Freq: Every day | ORAL | Status: DC

## 2012-12-31 NOTE — Telephone Encounter (Signed)
When he was here in Nov his heart rate was 85. Patient states it [place] between 80 and 90. I have advised him to increase dose, and to keep an eye on his BP and pulse and call me with a report next week. He is advised if he has any dizziness he is to call me immediately. Kaye Luoma

## 2012-12-31 NOTE — Telephone Encounter (Signed)
Patient should be taking Toprol-XL 50 one a day. If his heart rate is greater than 60 he can increase his Toprol to 100mg  a day

## 2013-01-20 ENCOUNTER — Other Ambulatory Visit: Payer: Self-pay | Admitting: *Deleted

## 2013-01-20 MED ORDER — METOPROLOL SUCCINATE ER 100 MG PO TB24: 100.0000 mg | ORAL_TABLET | Freq: Every day | ORAL | Status: DC

## 2013-02-28 ENCOUNTER — Telehealth: Payer: Self-pay

## 2013-02-28 ENCOUNTER — Encounter: Payer: Self-pay | Admitting: *Deleted

## 2013-02-28 MED ORDER — DILTIAZEM HCL ER COATED BEADS 240 MG PO CP24: 240.0000 mg | ORAL_CAPSULE | Freq: Every day | ORAL | Status: DC

## 2013-02-28 NOTE — Telephone Encounter (Signed)
.  diltiazem sent in.

## 2013-02-28 NOTE — Telephone Encounter (Signed)
Patient says he saw dr Cleta Alberts for blood pressure medication, and it is working very well, but does not have any refills would like to know if he could get some refill on this if possible [phone]

## 2013-03-01 ENCOUNTER — Telehealth: Payer: Self-pay

## 2013-03-01 NOTE — Telephone Encounter (Signed)
PT called to report that when we RFd his diltiazem, we filled it for 240 mg and Dr Cleta Alberts had increased it to 300 mg QD. I advised pt that I will call pharmacy to verify last Rx strength and get them to change it. Pt stated that his BPs have been better controlled on the 300 Mg along w/the metoprolol 100 that he normally only takes 1/2 tab QD.  Pt reported that he last filled diltiazem from [facility]. Called pharmacist and he reported that a Dr Jori Moll had written Rx for 300 mg on 10/06/12, but then we filled again for the 240 mg on 10/31/13. Dr Cleta Alberts, please advise which strength of diltiazem you want pt to be on.

## 2013-03-01 NOTE — Telephone Encounter (Signed)
He is correct be sure his prescription is for the diltiazem extended release 300 mg

## 2013-03-08 ENCOUNTER — Encounter: Payer: Self-pay | Admitting: Emergency Medicine

## 2013-03-08 ENCOUNTER — Ambulatory Visit (INDEPENDENT_AMBULATORY_CARE_PROVIDER_SITE_OTHER): Payer: 59 | Admitting: Emergency Medicine

## 2013-03-08 VITALS — BP 149/78 | HR 67 | Temp 97.8°F | Resp 16 | Ht 73.0 in | Wt 279.0 lb

## 2013-03-08 DIAGNOSIS — Z0000 Encounter for general adult medical examination without abnormal findings: Secondary | ICD-10-CM

## 2013-03-08 DIAGNOSIS — I10 Essential (primary) hypertension: Secondary | ICD-10-CM

## 2013-03-08 LAB — LIPID PANEL
Cholesterol: 170 mg/dL (ref 0–200)
HDL: 32 mg/dL — ABNORMAL LOW (ref >39)
Total CHOL/HDL Ratio: 5.3 Ratio

## 2013-03-08 LAB — COMPREHENSIVE METABOLIC PANEL
ALT: 46 U/L (ref 0–53)
CO2: 21 mEq/L (ref 19–32)
Creat: 0.82 mg/dL (ref 0.50–1.35)
Glucose, Bld: 119 mg/dL — ABNORMAL HIGH (ref 70–99)
Total Bilirubin: 0.6 mg/dL (ref 0.3–1.2)

## 2013-03-08 LAB — TSH: TSH: 1.819 u[IU]/mL (ref 0.350–4.500)

## 2013-03-08 LAB — CBC WITH DIFFERENTIAL/PLATELET
Eosinophils Absolute: 0.1 10*3/uL (ref 0.0–0.7)
Eosinophils Relative: 2 % (ref 0–5)
Lymphs Abs: 1.8 10*3/uL (ref 0.7–4.0)
MCH: 31.5 pg (ref 26.0–34.0)
MCHC: 36.5 g/dL — ABNORMAL HIGH (ref 30.0–36.0)
MCV: 86.5 fL (ref 78.0–100.0)
Platelets: 275 10*3/uL (ref 150–400)
RBC: 5.04 MIL/uL (ref 4.22–5.81)

## 2013-03-08 LAB — POCT URINALYSIS DIPSTICK
Protein, UA: NEGATIVE
Spec Grav, UA: 1.01
Urobilinogen, UA: 0.2

## 2013-03-08 LAB — POCT UA - MICROSCOPIC ONLY
Bacteria, U Microscopic: NEGATIVE
Casts, Ur, LPF, POC: NEGATIVE
Crystals, Ur, HPF, POC: NEGATIVE
Mucus, UA: NEGATIVE

## 2013-03-08 MED ORDER — DILTIAZEM HCL ER COATED BEADS 300 MG PO CP24: 300.0000 mg | ORAL_CAPSULE | Freq: Every day | ORAL | Status: DC

## 2013-03-08 MED ORDER — METOPROLOL SUCCINATE ER 100 MG PO TB24: ORAL_TABLET | ORAL | Status: DC

## 2013-03-08 NOTE — Progress Notes (Signed)
@[email or web address] @  Patient ID: [name] MRN: [id number], DOB: 1946/05/05 67 y.o. Date of Encounter: 03/08/2013, 8:00 AM  Primary Physician: Lucilla Edin, MD  Chief Complaint: Physical (CPE)  HPI: 67 y.o. y/o male with history noted below here for CPE.  Doing well. No issues/complaints.  Review of Systems:  Consitutional: No fever, chills, fatigue, night sweats, lymphadenopathy, or weight changes. Eyes: No visual changes, eye redness, or discharge. ENT/Mouth: Ears: No otalgia, tinnitus, hearing loss, discharge. Nose: No congestion, rhinorrhea, sinus pain, or epistaxis. Throat: No sore throat, post nasal drip, or teeth pain. Cardiovascular: No CP, palpitations, diaphoresis, DOE, edema, orthopnea, PND. Respiratory: No cough, hemoptysis, SOB, or wheezing. Gastrointestinal: No anorexia, dysphagia, reflux, pain, nausea, vomiting, hematemesis, diarrhea, constipation, BRBPR, or melena. Genitourinary: No dysuria, frequency, urgency, hematuria, incontinence, nocturia, decreased urinary stream, discharge, impotence, or testicular pain/masses. Musculoskeletal: No decreased ROM, myalgias, stiffness, joint swelling, or weakness. Skin: No rash, erythema, lesion changes, pain, warmth, jaundice, or pruritis. Neurological: No headache, dizziness, syncope, seizures, tremors, memory loss, coordination problems, or paresthesias. Psychological: No anxiety, depression, hallucinations, SI/HI. Endocrine: No fatigue, polydipsia, polyphagia, polyuria, or known diabetes. All other systems were reviewed and are otherwise negative.  Past Medical History  Diagnosis Date  . Hypertension   . Anxiety   . Pneumonia   . RAD (reactive airway disease)   . Allergic angioedema   . Atrial flutter      Past Surgical History  Procedure Laterality Date  . Appendectomy    . Tonsillectomy    . Colonoscopy    . Cholecystectomy    . Stress myoview  12/13/2008    EF 61% LV  normal    Home Meds:  Prior to  Admission medications   Medication Sig Start Date End Date Taking? Authorizing Provider  cetirizine (ZYRTEC) 10 MG tablet Take 10 mg by mouth daily.   Yes Historical Provider, MD  diltiazem (CARDIZEM CD) 240 MG 24 hr capsule Take 1 capsule (240 mg total) by mouth daily. 02/28/13  Yes Collene Gobble, MD  EPINEPHrine (EPI-PEN) 0.3 mg/0.3 mL DEVI Inject 0.3 mLs (0.3 mg total) into the muscle once. 10/19/12  Yes Eleanore Delia Chimes, PA-C  metoprolol succinate (TOPROL XL) 100 MG 24 hr tablet Take 1 tablet (100 mg total) by mouth daily. Take with or immediately following a meal. 01/20/13  Yes Collene Gobble, MD  aspirin 81 MG tablet Take 81 mg by mouth daily.    Historical Provider, MD  olmesartan-hydrochlorothiazide (BENICAR HCT) 40-12.5 MG per tablet Take 1 tablet by mouth daily.    Historical Provider, MD  PRESCRIPTION MEDICATION Take 20 mg by mouth daily. Patient takes Soil scientist, MD  rivaroxaban 20 MG TABS Take 20 mg by mouth daily. 02/04/12   Ozella Rocks, MD    Allergies:  Allergies  Allergen Reactions  . Ibuprofen Swelling  . Lisinopril Swelling    Swelling of face and lips   . Oxaprozin Swelling    Swelling of face and lips  . Ziac (Bisoprolol-Hydrochlorothiazide) Swelling    Per patient records from Dr. Herb Grays.  . Bystolic (Nebivolol Hcl) Itching  . Hctz (Hydrochlorothiazide)   . Norvasc (Amlodipine Besylate)     History   Social History  . Marital Status: Married    Spouse Name: N/A    Number of Children: N/A  . Years of Education: N/A   Occupational History  . Not on file.   Social History Main Topics  . Smoking status:  Former Smoker    Types: Cigarettes, Pipe    Quit date: 10/05/1996  . Smokeless tobacco: Current User    Types: Chew     Comment: Occasionally  . Alcohol Use: Not on file  . Drug Use: No  . Sexually Active: Yes    Birth Control/ Protection: None   Other Topics Concern  . Not on file   Social History Narrative  . No narrative  on file    Family History  Problem Relation Age of Onset  . Pancreatic cancer Mother   . Hypertension Mother   . Hypertension Sister   . Asthma Son     Physical Exam:  Blood pressure 149/78, pulse 67, temperature 97.8 F (36.6 C), temperature source Oral, resp. rate 16, height 6\' 1"  (1.854 m), weight 279 lb (126.554 kg).  General: Well developed, well nourished, in no acute distress. HEENT: Normocephalic, atraumatic. Conjunctiva pink, sclera non-icteric. Pupils 2 mm constricting to 1 mm, round, regular, and equally reactive to light and accomodation. EOMI. Internal auditory canal clear. TMs with good cone of light and without pathology. Nasal mucosa pink. Nares are without discharge. No sinus tenderness. Oral mucosa pink. Dentitionnormal. Pharynx without exudate.   Neck: Supple. Trachea midline. No thyromegaly. Full ROM. No lymphadenopathy. Lungs: Clear to auscultation bilaterally without wheezes, rales, or rhonchi. Breathing is of normal effort and unlabored. Cardiovascular: RRR with S1 S2. No murmurs, rubs, or gallops appreciated. Distal pulses 2+ symmetrically. No carotid or abdominal bruits.  Abdomen: Soft, non-tender, non-distended with normoactive bowel sounds. No hepatosplenomegaly or masses. No rebound/guarding. No CVA tenderness. Without hernias.  Rectal: No external hemorrhoids or fissures. Rectal vault without masses.   Genitourinary:   circumcised male. No penile lesions. Testes descended bilaterally, and smooth without tenderness or masses.  Musculoskeletal: Full range of motion and 5/5 strength throughout. Without swelling, atrophy, tenderness, crepitus, or warmth. Extremities without clubbing, cyanosis, or edema. Calves supple. Skin: Warm and moist without erythema, ecchymosis, wounds, or rash. Neuro: A+Ox3. CN II-XII grossly intact. Moves all extremities spontaneously. Full sensation throughout. Normal gait. DTR 2+ throughout upper and lower extremities. Finger to nose  intact. Psych:  Responds to questions appropriately with a normal affect.   Studies: CBC, CMET, Lipid, PSA,       Assessment/Plan:  67 y.o. y/o white male here for a physical. His major problems have been related to multiple drug allergies. He's doing well with his blood pressure control at the present time except continues to run slightly high systolics in the 150-160 range at times.   -  Signed, Earl Lites, MD 03/08/2013 8:00 AM

## 2013-03-08 NOTE — Progress Notes (Signed)
  Subjective:    Patient ID: [name], male    DOB: 05/01/1946, 67 y.o.   MRN: [id number]  HPI    Review of Systems  Constitutional: Negative.   HENT: Negative.   Eyes: Negative.   Respiratory: Negative.   Cardiovascular: Negative.   Gastrointestinal: Negative.   Endocrine: Negative.   Genitourinary: Negative.   Allergic/Immunologic: Negative.   Neurological: Negative.   Hematological: Negative.   Psychiatric/Behavioral: Negative.        Objective:   Physical Exam        Assessment & Plan:

## 2013-03-08 NOTE — Telephone Encounter (Signed)
Notified pt that correct Rx was sent in.

## 2013-03-09 ENCOUNTER — Encounter: Payer: Self-pay | Admitting: Internal Medicine

## 2013-04-15 ENCOUNTER — Other Ambulatory Visit: Payer: Self-pay | Admitting: Radiology

## 2013-04-15 DIAGNOSIS — I10 Essential (primary) hypertension: Secondary | ICD-10-CM

## 2013-04-15 MED ORDER — DILTIAZEM HCL ER COATED BEADS 300 MG PO CP24: 300.0000 mg | ORAL_CAPSULE | Freq: Every day | ORAL | Status: DC

## 2013-04-15 MED ORDER — METOPROLOL SUCCINATE ER 100 MG PO TB24: ORAL_TABLET | ORAL | Status: DC

## 2013-07-12 ENCOUNTER — Encounter: Payer: Self-pay | Admitting: Emergency Medicine

## 2013-07-12 ENCOUNTER — Ambulatory Visit (INDEPENDENT_AMBULATORY_CARE_PROVIDER_SITE_OTHER): Payer: 59 | Admitting: Emergency Medicine

## 2013-07-12 VITALS — BP 150/84 | HR 71 | Temp 98.5°F | Resp 18 | Ht 73.0 in | Wt 277.0 lb

## 2013-07-12 DIAGNOSIS — E786 Lipoprotein deficiency: Secondary | ICD-10-CM

## 2013-07-12 DIAGNOSIS — E663 Overweight: Secondary | ICD-10-CM

## 2013-07-12 DIAGNOSIS — I10 Essential (primary) hypertension: Secondary | ICD-10-CM

## 2013-07-12 NOTE — Patient Instructions (Signed)
In the afternoon if your blood pressure is elevated he can go ahead and take another half of your Toprol.

## 2013-07-12 NOTE — Progress Notes (Signed)
  Subjective:    Patient ID: [name], male    DOB: 1946-06-28, 67 y.o.   MRN: [id number]  HPI here to  followup blood pressure. He is doing well. He still occasionally has a tingling sensation in his lip if he does not take his Zyrtec every day but overall he is doing well and feels well.    Review of Systems     Objective:   Physical Exam HEENT exam is unremarkable. Neck supple. Chest clear to auscultation and percussion. Heart regular no murmurs. Soft nontender abdomen.       Assessment & Plan:  He is to take an extra half a Toprol in the afternoon if his pressure remains elevated

## 2013-09-02 ENCOUNTER — Encounter: Payer: Self-pay | Admitting: Internal Medicine

## 2013-09-02 ENCOUNTER — Ambulatory Visit (INDEPENDENT_AMBULATORY_CARE_PROVIDER_SITE_OTHER): Payer: 59 | Admitting: Internal Medicine

## 2013-09-02 VITALS — BP 160/74 | HR 60 | Ht 74.0 in | Wt 283.5 lb

## 2013-09-02 DIAGNOSIS — I4892 Unspecified atrial flutter: Secondary | ICD-10-CM

## 2013-09-02 DIAGNOSIS — T783XXS Angioneurotic edema, sequela: Secondary | ICD-10-CM

## 2013-09-02 DIAGNOSIS — I10 Essential (primary) hypertension: Secondary | ICD-10-CM

## 2013-09-02 DIAGNOSIS — Z79899 Other long term (current) drug therapy: Secondary | ICD-10-CM

## 2013-09-02 MED ORDER — CHLORTHALIDONE 25 MG PO TABS: 25.0000 mg | ORAL_TABLET | Freq: Every day | ORAL | Status: DC

## 2013-09-02 NOTE — Patient Instructions (Addendum)
Your physician has recommended you make the following change in your medication: START Chlorthalidone 25mg  daily.  Please come back in 1 week for a Blood Pressure check and have lab work done at that time.

## 2013-09-02 NOTE — Progress Notes (Signed)
OFFICE NOTE  Chief Complaint:  Routine followup, blood pressure running high  Primary Care Physician: Lucilla Edin, MD  HPI:  [name] is a 67 year old gentleman with a history of atrial flutter which is paroxysmal. He was discharged on Xarelto and had an outpatient cardioversion, was found to be in sinus spontaneously. He did feel much better after he converted and was very aware of his atrial flutter. He was, however, on diltiazem despite the fact that he went into his abnormal rhythm and so I added a low-dose beta blocker which seems to have controlled his rate and blood pressure better. He does feel a little more tired easily which he contributes possibly to the medicine, however, his wife noted that he was a little more depressed. I am not clear if this is related to the beta blocker or other factors. He has had no recurrence of atrial flutter or fibrillation that I can tell and was taken off Xarelto. His CHADS2 score is 1, and I feel like he is at low risk given his age of 68. With the low burden of atrial fibrillation it is very reasonable.  He reports no further reoccurrence of atrial fibrillation or flutter. Recently he's been having problems with high blood pressure and notes that he has several intolerances and/or serious allergies to medications, including angioedema to ACE inhibitors and ARB medications.  He apparently has an allergy to hydrochlorothiazide, but does not recall whether that was rash or not.  PMHx:  Past Medical History  Diagnosis Date  . Hypertension   . Anxiety   . Pneumonia   . RAD (reactive airway disease)   . Allergic angioedema   . Atrial flutter     Past Surgical History  Procedure Laterality Date  . Appendectomy    . Tonsillectomy    . Colonoscopy    . Cholecystectomy    . Stress myoview  12/13/2008    EF 61% LV  normal    FAMHx:  Family History  Problem Relation Age of Onset  . Pancreatic cancer Mother   . Hypertension Mother   .  Cancer Mother   . Hypertension Sister   . Asthma Son   . Stroke Father   . Hypertension Brother   . Hypertension Daughter   . Cancer Maternal Grandfather     SOCHx:   reports that he quit smoking about 16 years ago. His smoking use included Cigarettes and Pipe. He smoked 0.00 packs per day. His smokeless tobacco use includes Chew. He reports that he does not drink alcohol or use illicit drugs.  ALLERGIES:  Allergies  Allergen Reactions  . Ibuprofen Swelling  . Lisinopril Swelling    Swelling of face and lips   . Oxaprozin Swelling    Swelling of face and lips  . Ziac [Bisoprolol-Hydrochlorothiazide] Swelling    Per patient records from Dr. Herb Grays.  Sherrie Mustache Hcl] Itching  . Hctz [Hydrochlorothiazide]   . Norvasc [Amlodipine Besylate]     ROS: A comprehensive review of systems was negative except for: Cardiovascular: positive for lower extremity edema  HOME MEDS: Current Outpatient Prescriptions  Medication Sig Dispense Refill  . cetirizine (ZYRTEC) 10 MG tablet Take 10 mg by mouth daily.      Marland Kitchen diltiazem (CARDIZEM CD) 300 MG 24 hr capsule Take 1 capsule (300 mg total) by mouth daily.  30 capsule  8  . EPINEPHrine (EPI-PEN) 0.3 mg/0.3 mL DEVI Inject 0.3 mLs (0.3 mg total) into the muscle  once.  1 Device  1  . metoprolol succinate (TOPROL-XL) 100 MG 24 hr tablet Take 50 mg by mouth daily. Takes 50mg  in PM if needed for BP      . chlorthalidone (HYGROTON) 25 MG tablet Take 1 tablet (25 mg total) by mouth daily.  30 tablet  2   No current facility-administered medications for this visit.    LABS/IMAGING: No results found for this or any previous visit (from the past 48 hour(s)). No results found.  VITALS: BP 160/74  Pulse 60  Ht 6\' 2"  (1.88 m)  Wt 283 lb 8 oz (128.595 kg)  BMI 36.38 kg/m2  EXAM: General appearance: alert and no distress Neck: no adenopathy, no carotid bruit, no JVD, supple, symmetrical, trachea midline and thyroid not enlarged,  symmetric, no tenderness/mass/nodules Lungs: clear to auscultation bilaterally Heart: regular rate and rhythm, S1, S2 normal, no murmur, click, rub or gallop Abdomen: soft, non-tender; bowel sounds normal; no masses,  no organomegaly Extremities: edema trace edema Pulses: 2+ and symmetric Skin: Skin color, texture, turgor normal. No rashes or lesions Neurologic: Grossly normal  EKG: Sinus rhythm at 60  ASSESSMENT: 1. Hypertension-not a goal 2. Paroxysmal atrial flutter without recurrence, low chads 2 score on aspirin  PLAN: 1.   Mr. Smalls has had no real recurrence of atrial flutter. His blood pressure is higher than desired and he's been intolerant unfortunately to a number of medicines and classes of medications. Given his history of angioedema with ACE and ARB, think it would be too dangerous use related medications. It may be beneficial to treat him with a diuretic, although he was apparently intolerant to HCTZ it is unclear what the side effect was. He reports some tertiary swelling, which may be related to his diltiazem. He is interested in trying a diuretic and will start low-dose chlorthalidone 25 mg daily today. Plan to recheck a BMP in one week with a followup blood pressure check in our office.   Chrystie Nose, MD, [facility] Attending Cardiologist [facility]  HILTY,Kenneth C 09/02/2013, 10:56 AM

## 2013-09-09 ENCOUNTER — Ambulatory Visit (INDEPENDENT_AMBULATORY_CARE_PROVIDER_SITE_OTHER): Payer: 59 | Admitting: Pharmacist Clinician (PhC)/ Clinical Pharmacy Specialist

## 2013-09-09 ENCOUNTER — Encounter: Payer: Self-pay | Admitting: Pharmacist Clinician (PhC)/ Clinical Pharmacy Specialist

## 2013-09-09 VITALS — BP 138/76 | HR 68 | Ht 74.0 in | Wt 277.6 lb

## 2013-09-09 DIAGNOSIS — I10 Essential (primary) hypertension: Secondary | ICD-10-CM

## 2013-09-09 NOTE — Progress Notes (Signed)
09/09/2013 BETHANY CUMMING 04-24-1946 [id number]   HPI:  [name] is a 67 y.o. male with a PMH below who presents today for a follow up blood pressure check.  He is feeling fine today, has no complaints.  States he checks his BP at home on occasion, most readings are between 135-145 systolic and 70-80 diastolic.  He has some allergies/intolerances to medications, including ACEI/ARBs, amlodipine and hctz.  He is currently taking metoprolol, diltiazem and chlorthalidone.  He does not smoke, only drinks on occasion and currently exercises 4-5 times per week at his local [employer] (cardio and weight training).  He does not add salt to his food at home, but does admit they eat out "regularly".  His family history is significant in that his mother and 2 siblings have also had hypertension.     Current Outpatient Prescriptions  Medication Sig Dispense Refill  . cetirizine (ZYRTEC) 10 MG tablet Take 10 mg by mouth daily.      . chlorthalidone (HYGROTON) 25 MG tablet Take 1 tablet (25 mg total) by mouth daily.  30 tablet  2  . diltiazem (CARDIZEM CD) 300 MG 24 hr capsule Take 1 capsule (300 mg total) by mouth daily.  30 capsule  8  . EPINEPHrine (EPI-PEN) 0.3 mg/0.3 mL DEVI Inject 0.3 mLs (0.3 mg total) into the muscle once.  1 Device  1  . metoprolol succinate (TOPROL-XL) 100 MG 24 hr tablet Take 50 mg by mouth daily. Takes 50mg  in PM if needed for BP       No current facility-administered medications for this visit.    Allergies  Allergen Reactions  . Ibuprofen Swelling  . Lisinopril Swelling    Swelling of face and lips   . Oxaprozin Swelling    Swelling of face and lips  . Ziac [Bisoprolol-Hydrochlorothiazide] Swelling    Per patient records from Dr. Herb Grays.  Sherrie Mustache Hcl] Itching  . Hctz [Hydrochlorothiazide]   . Norvasc [Amlodipine Besylate]     Past Medical History  Diagnosis Date  . Hypertension   . Anxiety   . Pneumonia   . RAD (reactive airway disease)    . Allergic angioedema   . Atrial flutter     Blood pressure 138/76, pulse 68, height 6\' 2"  (1.88 m), weight 277 lb 9.6 oz (125.919 kg).   ASSESSMENT AND PLAN:  His blood pressure today is WNL at 138/76.  Based on the JNC 8 guidelines his goal is to remain <150/90.  We will not make any changes today to his medications, and I have asked him to continue watching his BP at home.  If it is to continually be high 140s or >150 he is to call office.  I encouraged him to keep up with the exercise and to try not eating in restaurants quite so frequently.  I asked him to come back in 3 months for a follow up check and to bring his home BP cuff with him so that we might calibrate it to our office cuff.

## 2013-09-09 NOTE — Patient Instructions (Addendum)
Your blood pressure today is good at 138/76. Check your blood pressure at home 3-4 times per week and keep record of the readings.  Take your BP meds as follows:  AM: diltiazem         Metoprolol         Chlorthalidone   Bring all of your meds, your BP cuff and your record of home blood pressures to your next appointment.  Exercise as you're able, try to walk approximately 30 minutes per day.  Keep salt intake to a minimum, especially watch canned and prepared boxed foods.  Eat more fresh fruits and vegetables and fewer canned items.  Avoid eating in fast food restaurants.   Return in 3 months for blood pressure check   HOW TO TAKE YOUR BLOOD PRESSURE:   Rest 5 minutes before taking your blood pressure.    Don't smoke or drink caffeinated beverages for at least 30 minutes before.   Take your blood pressure before (not after) you eat.   Sit comfortably with your back supported and both feet on the floor (don't cross your legs).   Elevate your arm to heart level on a table or a desk.   Use the proper sized cuff. It should fit smoothly and snugly around your bare upper arm. There should be enough room to slip a fingertip under the cuff. The bottom edge of the cuff should be 1 inch above the crease of the elbow.   Ideally, take 3 measurements at one sitting and record the average.

## 2013-09-10 LAB — BASIC METABOLIC PANEL
Potassium: 4.5 mEq/L (ref 3.5–5.3)
Sodium: 136 mEq/L (ref 135–145)

## 2013-10-19 ENCOUNTER — Telehealth: Payer: Self-pay | Admitting: Pharmacist Clinician (PhC)/ Clinical Pharmacy Specialist

## 2013-10-19 NOTE — Telephone Encounter (Signed)
Spoke with pt wife.  Left msg for pt to call and schedule f/u bp check with kristin in December. (3 month f/u)

## 2013-11-13 ENCOUNTER — Other Ambulatory Visit: Payer: Self-pay | Admitting: Emergency Medicine

## 2013-11-14 NOTE — Telephone Encounter (Signed)
Patient has appt in March, please advise on rx request.

## 2013-11-18 ENCOUNTER — Encounter: Payer: Self-pay | Admitting: Pharmacist Clinician (PhC)/ Clinical Pharmacy Specialist

## 2013-11-18 ENCOUNTER — Ambulatory Visit (INDEPENDENT_AMBULATORY_CARE_PROVIDER_SITE_OTHER): Payer: Medicare Other | Admitting: Pharmacist Clinician (PhC)/ Clinical Pharmacy Specialist

## 2013-11-18 VITALS — BP 146/80 | HR 68 | Ht 74.0 in | Wt 281.0 lb

## 2013-11-18 DIAGNOSIS — I10 Essential (primary) hypertension: Secondary | ICD-10-CM

## 2013-11-18 NOTE — Patient Instructions (Signed)
Return for a follow up BP check in 6 months  Your blood pressure today is good at 146/80  Check your blood pressure at home 3-4 times per week and keep record of the readings.  Take your BP meds as follows:  AM: Diltiazem 300mg          Chlorthalidone 25mg    PM: Metoprolol XL 50mg  (1/2 of 100mg  tab)   Bring all of your meds, your BP cuff and your record of home blood pressures to your next appointment.  Exercise as you're able, try to walk approximately 30 minutes per day.  Keep salt intake to a minimum, especially watch canned and prepared boxed foods.  Eat more fresh fruits and vegetables and fewer canned items.  Avoid eating in fast food restaurants.    HOW TO TAKE YOUR BLOOD PRESSURE:   Rest 5 minutes before taking your blood pressure.    Don't smoke or drink caffeinated beverages for at least 30 minutes before.   Take your blood pressure before (not after) you eat.   Sit comfortably with your back supported and both feet on the floor (don't cross your legs).   Elevate your arm to heart level on a table or a desk.   Use the proper sized cuff. It should fit smoothly and snugly around your bare upper arm. There should be enough room to slip a fingertip under the cuff. The bottom edge of the cuff should be 1 inch above the crease of the elbow.   Ideally, take 3 measurements at one sitting and record the average.

## 2013-11-18 NOTE — Progress Notes (Signed)
11/18/2013 [name] 08-24-1946 [id number]   HPI:  [name] is a 67 y.o. male, patient of Dr. Rennis Golden, with a PMH below who presents today for a follow up blood pressure check.  He is feeling fine today, has no complaints. Has been checking blood pressure at home regularly and noticed that am readings are 140-150s/70-80s.  About 1-2 hours after taking his meds (all in am) his pressure usually drops to 120-130s systolic.  He has an Omron cuff, approximately 65 years old, which he brought with him into the office this morning.  He has some allergies/intolerances to medications, including ACEI/ARBs, amlodipine and hctz.  He is currently taking metoprolol, diltiazem and chlorthalidone.  He does not smoke, only drinks on occasion and currently exercises 4-5 times per week at his local [employer] (cardio and weight training). Lab work done after he started the chlorthalidone shows kidney function and potassium levels stable.   Current Outpatient Prescriptions  Medication Sig Dispense Refill  . cetirizine (ZYRTEC) 10 MG tablet Take 10 mg by mouth daily.      . chlorthalidone (HYGROTON) 25 MG tablet Take 1 tablet (25 mg total) by mouth daily.  30 tablet  2  . diltiazem (CARDIZEM CD) 300 MG 24 hr capsule Take 1 capsule by mouth  daily  30 capsule  3  . EPINEPHrine (EPI-PEN) 0.3 mg/0.3 mL DEVI Inject 0.3 mLs (0.3 mg total) into the muscle once.  1 Device  1  . metoprolol succinate (TOPROL-XL) 100 MG 24 hr tablet Take 50 mg by mouth daily. Takes 50mg  in PM if needed for BP       No current facility-administered medications for this visit.    Allergies  Allergen Reactions  . Ibuprofen Swelling  . Lisinopril Swelling    Swelling of face and lips   . Oxaprozin Swelling    Swelling of face and lips  . Ziac [Bisoprolol-Hydrochlorothiazide] Swelling    Per patient records from Dr. Herb Grays.  Sherrie Mustache Hcl] Itching  . Hctz [Hydrochlorothiazide]   . Norvasc [Amlodipine Besylate]      Past Medical History  Diagnosis Date  . Hypertension   . Anxiety   . Pneumonia   . RAD (reactive airway disease)   . Allergic angioedema   . Atrial flutter     Blood pressure 146/80, pulse 68, height 6\' 2"  (1.88 m), weight 281 lb (127.461 kg).   ASSESSMENT AND PLAN:  His blood pressure today is WNL at 146/80.  Based on the JNC 8 guidelines his goal is to remain <150/90.  We will not make any changes today to his medications, and I have asked him to continue watching his BP at home 3-4 times per week.  His home cuff read slightly higher at 153/86, so I believe most of his home readings are truly within range.  He was encouraged to keep up with his exercise routine and continue to watch for low sodium foods when eating in restaurants.  He will return in 6 months for a follow up blood pressure check.    Phillips Hay PharmD CPP [facility]

## 2013-11-29 ENCOUNTER — Encounter: Payer: Self-pay | Admitting: Pharmacist Clinician (PhC)/ Clinical Pharmacy Specialist

## 2013-12-22 ENCOUNTER — Other Ambulatory Visit: Payer: Self-pay | Admitting: *Deleted

## 2013-12-22 MED ORDER — CHLORTHALIDONE 25 MG PO TABS: 25.0000 mg | ORAL_TABLET | Freq: Every day | ORAL | Status: DC

## 2013-12-22 NOTE — Telephone Encounter (Signed)
Rx was sent to pharmacy electronically. 

## 2014-01-13 ENCOUNTER — Other Ambulatory Visit: Payer: Self-pay | Admitting: Internal Medicine

## 2014-01-16 NOTE — Telephone Encounter (Signed)
Rx was sent to pharmacy electronically. 

## 2014-03-14 ENCOUNTER — Other Ambulatory Visit: Payer: Self-pay | Admitting: Emergency Medicine

## 2014-03-14 ENCOUNTER — Encounter: Payer: Self-pay | Admitting: Emergency Medicine

## 2014-03-14 ENCOUNTER — Encounter (INDEPENDENT_AMBULATORY_CARE_PROVIDER_SITE_OTHER): Payer: Medicare HMO | Admitting: Emergency Medicine

## 2014-03-14 VITALS — BP 136/74 | HR 71 | Temp 99.0°F | Resp 16 | Ht 73.0 in | Wt 265.8 lb

## 2014-03-14 DIAGNOSIS — R739 Hyperglycemia, unspecified: Secondary | ICD-10-CM

## 2014-03-14 DIAGNOSIS — Z0000 Encounter for general adult medical examination without abnormal findings: Secondary | ICD-10-CM

## 2014-03-14 DIAGNOSIS — I10 Essential (primary) hypertension: Secondary | ICD-10-CM

## 2014-03-14 DIAGNOSIS — F439 Reaction to severe stress, unspecified: Secondary | ICD-10-CM

## 2014-03-14 DIAGNOSIS — Z733 Stress, not elsewhere classified: Secondary | ICD-10-CM

## 2014-03-14 DIAGNOSIS — Z1211 Encounter for screening for malignant neoplasm of colon: Secondary | ICD-10-CM

## 2014-03-14 LAB — CBC
HCT: 44.2 % (ref 39.0–52.0)
HEMOGLOBIN: 15.8 g/dL (ref 13.0–17.0)
MCH: 31.3 pg (ref 26.0–34.0)
MCHC: 35.7 g/dL (ref 30.0–36.0)
MCV: 87.7 fL (ref 78.0–100.0)
Platelets: 266 10*3/uL (ref 150–400)
RBC: 5.04 MIL/uL (ref 4.22–5.81)
RDW: 13.6 % (ref 11.5–15.5)
WBC: 5.7 10*3/uL (ref 4.0–10.5)

## 2014-03-14 LAB — POCT URINALYSIS DIPSTICK
Bilirubin, UA: NEGATIVE
Blood, UA: NEGATIVE
Glucose, UA: NEGATIVE
KETONES UA: NEGATIVE
Leukocytes, UA: NEGATIVE
Nitrite, UA: NEGATIVE
PH UA: 7
PROTEIN UA: NEGATIVE
SPEC GRAV UA: 1.015
Urobilinogen, UA: 0.2

## 2014-03-14 LAB — COMPLETE METABOLIC PANEL WITH GFR
ALT: 39 U/L (ref 0–53)
AST: 31 U/L (ref 0–37)
Albumin: 4 g/dL (ref 3.5–5.2)
Alkaline Phosphatase: 55 U/L (ref 39–117)
BILIRUBIN TOTAL: 0.6 mg/dL (ref 0.2–1.2)
BUN: 21 mg/dL (ref 6–23)
CO2: 28 meq/L (ref 19–32)
CREATININE: 1.03 mg/dL (ref 0.50–1.35)
Calcium: 9 mg/dL (ref 8.4–10.5)
Chloride: 100 mEq/L (ref 96–112)
GFR, EST AFRICAN AMERICAN: 88 mL/min
GFR, EST NON AFRICAN AMERICAN: 76 mL/min
GLUCOSE: 126 mg/dL — AB (ref 70–99)
Potassium: 3.6 mEq/L (ref 3.5–5.3)
Sodium: 138 mEq/L (ref 135–145)
Total Protein: 7 g/dL (ref 6.0–8.3)

## 2014-03-14 LAB — LIPID PANEL
CHOL/HDL RATIO: 4.9 ratio
CHOLESTEROL: 165 mg/dL (ref 0–200)
HDL: 34 mg/dL — AB (ref >39)
LDL Cholesterol: 108 mg/dL — ABNORMAL HIGH (ref 0–99)
Triglycerides: 117 mg/dL (ref <150)
VLDL: 23 mg/dL (ref 0–40)

## 2014-03-14 LAB — TSH: TSH: 1.609 u[IU]/mL (ref 0.350–4.500)

## 2014-03-14 LAB — IFOBT (OCCULT BLOOD): IFOBT: NEGATIVE

## 2014-03-14 MED ORDER — ALPRAZOLAM 0.25 MG PO TABS: 0.2500 mg | ORAL_TABLET | Freq: Two times a day (BID) | ORAL | Status: DC | PRN

## 2014-03-14 NOTE — Progress Notes (Deleted)
  This chart was scribed for [name], MD by Eston Mould, ED Scribe. This patient was seen in room Room/bed 23 and the patient's care was started at 3:48 PM. Subjective:    Patient ID: [name], male    DOB: 01-07-1946, 68 y.o.   MRN: [id number] Chief Complaint  Patient presents with  . Annual Exam   HPI [name] is a 68 y.o. male who presents to the [facility] for his annual exam. Pt states his BP has been well. Pt is unsure if he is UTD with his vaccines but states he is hesitant to get vaccines due to allergies to several allergins. He reports having an EpiPen at home and states he will place one in his vehicle. Pt states he has been doing well with his angioedema. Pt states he had a colonoscopy last year. Pt states he seen his Cardiologist last year and was told to F/U in 10 years. Pt states his cardiologist  d/c Xarelto.    Pt c/o of L sided temporal pressure and tenderness. He suspects this is due to his sinuses. Pt denies having L sided visual disturbance.  Review of Systems  Eyes: Negative for visual disturbance.  Neurological: Negative for syncope and light-headedness.  All other systems reviewed and are negative.   Objective:   Physical Exam CONSTITUTIONAL: Well developed/well nourished HEAD: Normocephalic/atraumatic. Mild tenderness to L temporal area. EYES: EOMI/PERRL ENMT: Mucous membranes moist NECK: supple no meningeal signs SPINE:entire spine nontender CV: Regular rate and rhythm. no murmurs/rubs/gallops noted LUNGS: Lungs are clear to auscultation bilaterally, no apparent distress ABDOMEN: soft, nontender, no rebound or guarding GU:no cva tenderness. Rectal exam was normal. No masses. NEURO: Pt is awake/alert, moves all extremitiesx4 EXTREMITIES: pulses normal, full ROM SKIN: warm, color normal PSYCH: no abnormalities of mood noted  Results for orders placed in visit on 03/14/14  POCT URINALYSIS DIPSTICK      Result Value Ref Range     Color, UA yellow     Clarity, UA clear     Glucose, UA neg     Bilirubin, UA neg     Ketones, UA neg     Spec Grav, UA 1.015     Blood, UA neg     pH, UA 7.0     Protein, UA neg     Urobilinogen, UA 0.2     Nitrite, UA neg     Leukocytes, UA Negative      Triage Vitals:BP 136/74  Pulse 71  Temp(Src) 99 F (37.2 C) (Oral)  Resp 16  Ht 6\' 1"  (1.854 m)  Wt 265 lb 12.8 oz (120.566 kg)  BMI 35.08 kg/m2  SpO2 96% Assessment & Plan:  BP is well controled on current medication. He has had not recurrent episodes of A-Fib and is on Asprin. He has multiple drug allergies and angioedema, so I did not give him any injections today. Meds were kept the same. He was given a small prescription of Xanax to be used before he preforms.   I personally performed the services described in this documentation, which was scribed in my presence. The recorded information has been reviewed and is accurate.

## 2014-03-14 NOTE — Progress Notes (Deleted)
   Subjective:    Patient ID: [name], male    DOB: Apr 03, 1946, 68 y.o.   MRN: [id number]  HPI    Review of Systems  Constitutional: Negative.   HENT: Negative.   Eyes: Negative.   Respiratory: Negative.   Cardiovascular: Negative.   Gastrointestinal: Negative.   Endocrine: Negative.   Genitourinary: Negative.   Musculoskeletal: Negative.   Skin: Negative.   Allergic/Immunologic: Negative.   Neurological: Negative.   Hematological: Negative.   Psychiatric/Behavioral: Negative.        Objective:   Physical Exam        Assessment & Plan:

## 2014-03-16 LAB — HEMOGLOBIN A1C
Hgb A1c MFr Bld: 6 % — ABNORMAL HIGH (ref <5.7)
MEAN PLASMA GLUCOSE: 126 mg/dL — AB (ref <117)

## 2014-03-22 ENCOUNTER — Telehealth: Payer: Self-pay

## 2014-03-22 NOTE — Telephone Encounter (Signed)
Dr. Everlene Farrier, pt wants to know the results of the A1C that we added on please. Thanks

## 2014-03-22 NOTE — Telephone Encounter (Signed)
Pt.notified

## 2014-03-22 NOTE — Telephone Encounter (Signed)
His hemoglobin A1c was 6.0. 2 years ago it was 5.9. He needs to continue to watch his diet and try and take off 10-15 pounds

## 2014-04-17 ENCOUNTER — Other Ambulatory Visit: Payer: Self-pay | Admitting: Emergency Medicine

## 2014-04-18 ENCOUNTER — Other Ambulatory Visit: Payer: Self-pay | Admitting: Emergency Medicine

## 2014-06-02 ENCOUNTER — Ambulatory Visit (INDEPENDENT_AMBULATORY_CARE_PROVIDER_SITE_OTHER): Payer: Medicare HMO | Admitting: Pharmacist Clinician (PhC)/ Clinical Pharmacy Specialist

## 2014-06-02 ENCOUNTER — Encounter: Payer: Self-pay | Admitting: Pharmacist Clinician (PhC)/ Clinical Pharmacy Specialist

## 2014-06-02 VITALS — BP 128/70 | HR 76 | Ht 74.0 in | Wt 263.9 lb

## 2014-06-02 DIAGNOSIS — I10 Essential (primary) hypertension: Secondary | ICD-10-CM

## 2014-06-02 NOTE — Progress Notes (Signed)
06/02/2014 [name] 1946-11-30 [id number]   HPI:  [name] is a 68 y.o. male, patient of Dr. Debara Pickett, with a PMH below who presents today for a follow up blood pressure check.  He is feeling fine today, has no complaints. Has been checking blood pressure at home several times per week and finds most readings in the 130/70 range, with rare 867 systolic and nothing >672.  Diastolic readings have all been <80.  He has some allergies/intolerances to medications, including ACEI/ARBs, amlodipine and hctz.  He is currently taking metoprolol, diltiazem and chlorthalidone, and states compliance with all medications.  He does not smoke, only drinks on occasion and currently exercises 4-5 times per week at his local [employer] (cardio and weight training). Lab work done after he started the chlorthalidone shows kidney function and potassium levels stable.    Current Outpatient Prescriptions  Medication Sig Dispense Refill  . cetirizine (ZYRTEC) 10 MG tablet Take 10 mg by mouth daily.      . chlorthalidone (HYGROTON) 25 MG tablet Take 1 tablet (25 mg total) by mouth daily.  30 tablet  8  . diltiazem (CARDIZEM CD) 300 MG 24 hr capsule TAKE ONE CAPSULE P EVERY DAY  30 capsule  7  . EPINEPHrine (EPI-PEN) 0.3 mg/0.3 mL DEVI Inject 0.3 mLs (0.3 mg total) into the muscle once.  1 Device  1  . metoprolol succinate (TOPROL-XL) 100 MG 24 hr tablet Take 50 mg by mouth daily. Takes 50mg  in PM if needed for BP      . ALPRAZolam (XANAX) 0.25 MG tablet Take 1 tablet (0.25 mg total) by mouth 2 (two) times daily as needed for anxiety.  30 tablet  2   No current facility-administered medications for this visit.    Allergies  Allergen Reactions  . Ibuprofen Swelling  . Lisinopril Swelling    Swelling of face and lips   . Oxaprozin Swelling    Swelling of face and lips  . Ziac [Bisoprolol-Hydrochlorothiazide] Swelling    Per patient records from Dr. Florina Ou.  Thayer Jew Hcl] Itching  . Hctz  [Hydrochlorothiazide]   . Norvasc [Amlodipine Besylate]     Past Medical History  Diagnosis Date  . Hypertension   . Anxiety   . Pneumonia   . RAD (reactive airway disease)   . Allergic angioedema   . Atrial flutter     Blood pressure 128/70, pulse 76, height 6\' 2"  (1.88 m), weight 263 lb 14.4 oz (119.704 kg).   ASSESSMENT AND PLAN:  His blood pressure today is WNL at 128/70.  Based on the Stuart 8 guidelines his goal is to remain <150/90.  We will not make any changes today to his medications, and I have asked him to continue watching his BP at home 3-4 times per week.  His home cuff was tested at his last visit and found to be accurate within 10 points.  I have asked that he continue with random home BP checks, several times per week, and if he sees readings trending to >150/90 he is to call the office for an appointment.  I will see him again in the future should the need arise.   [name] PharmD CPP [facility]

## 2014-06-02 NOTE — Patient Instructions (Signed)
Call if you see your BP increasing to >150/90 on a regular basis   Your blood pressure today is 128/70   Check your blood pressure at home randomly and keep record of the readings.  No changes in your medications today  Bring all of your meds, your BP cuff and your record of home blood pressures to your next appointment.  Exercise as you're able, try to walk approximately 30 minutes per day.  Keep salt intake to a minimum, especially watch canned and prepared boxed foods.  Eat more fresh fruits and vegetables and fewer canned items.  Avoid eating in fast food restaurants.    HOW TO TAKE YOUR BLOOD PRESSURE:   Rest 5 minutes before taking your blood pressure.    Don't smoke or drink caffeinated beverages for at least 30 minutes before.   Take your blood pressure before (not after) you eat.   Sit comfortably with your back supported and both feet on the floor (don't cross your legs).   Elevate your arm to heart level on a table or a desk.   Use the proper sized cuff. It should fit smoothly and snugly around your bare upper arm. There should be enough room to slip a fingertip under the cuff. The bottom edge of the cuff should be 1 inch above the crease of the elbow.   Ideally, take 3 measurements at one sitting and record the average.

## 2014-09-14 NOTE — Progress Notes (Signed)
   Subjective:    Patient ID: [name], male    DOB: 10-31-1946, 68 y.o.   MRN: [id number]  HPI    Review of Systems     Objective:   Physical Exam        Assessment & Plan:   This encounter was created in error - please disregard.

## 2014-09-19 ENCOUNTER — Other Ambulatory Visit: Payer: Self-pay | Admitting: Internal Medicine

## 2014-09-19 NOTE — Telephone Encounter (Signed)
Rx was sent to pharmacy electronically. 

## 2014-11-25 ENCOUNTER — Other Ambulatory Visit: Payer: Self-pay | Admitting: Internal Medicine

## 2014-11-27 ENCOUNTER — Other Ambulatory Visit: Payer: Self-pay | Admitting: Internal Medicine

## 2014-11-27 NOTE — Telephone Encounter (Signed)
Rx refill sent to patient pharmacy  With note that no additional refills will be filled w/o appointment

## 2014-11-27 NOTE — Telephone Encounter (Signed)
Rx refill sent to patient pharmacy   

## 2014-12-25 ENCOUNTER — Other Ambulatory Visit: Payer: Self-pay | Admitting: Internal Medicine

## 2014-12-26 ENCOUNTER — Other Ambulatory Visit: Payer: Self-pay | Admitting: Internal Medicine

## 2014-12-28 ENCOUNTER — Encounter (HOSPITAL_COMMUNITY): Payer: Self-pay | Admitting: Internal Medicine

## 2015-01-29 ENCOUNTER — Other Ambulatory Visit: Payer: Self-pay | Admitting: Internal Medicine

## 2015-01-29 NOTE — Telephone Encounter (Signed)
Rx(s) sent to pharmacy electronically. Message sent to [facility], Dr. Lysbeth Penner scheduler, to contact patient for an appointment

## 2015-01-30 ENCOUNTER — Telehealth: Payer: Self-pay | Admitting: Internal Medicine

## 2015-01-30 NOTE — Telephone Encounter (Signed)
Close encounter 

## 2015-02-01 ENCOUNTER — Ambulatory Visit (INDEPENDENT_AMBULATORY_CARE_PROVIDER_SITE_OTHER): Payer: Medicare HMO | Admitting: Internal Medicine

## 2015-02-01 ENCOUNTER — Encounter: Payer: Self-pay | Admitting: Internal Medicine

## 2015-02-01 VITALS — BP 140/72 | HR 76 | Ht 74.0 in | Wt 274.4 lb

## 2015-02-01 DIAGNOSIS — I4892 Unspecified atrial flutter: Secondary | ICD-10-CM

## 2015-02-01 DIAGNOSIS — I10 Essential (primary) hypertension: Secondary | ICD-10-CM

## 2015-02-01 NOTE — Patient Instructions (Signed)
Your physician wants you to follow-up in: 1 year with Dr. Hilty. You will receive a reminder letter in the mail two months in advance. If you don't receive a letter, please call our office to schedule the follow-up appointment.  

## 2015-02-01 NOTE — Progress Notes (Signed)
OFFICE NOTE  Chief Complaint:  No complaints  Primary Care Physician: Jenny Reichmann, MD  HPI:  DENI LEFEVER is a 69 year old gentleman with a history of atrial flutter which is paroxysmal. He was discharged on Xarelto and had an outpatient cardioversion, was found to be in sinus spontaneously. He did feel much better after he converted and was very aware of his atrial flutter. He was, however, on diltiazem despite the fact that he went into his abnormal rhythm and so I added a low-dose beta blocker which seems to have controlled his rate and blood pressure better. He does feel a little more tired easily which he contributes possibly to the medicine, however, his wife noted that he was a little more depressed. I am not clear if this is related to the beta blocker or other factors. He has had no recurrence of atrial flutter or fibrillation that I can tell and was taken off Xarelto. His CHADS2 score is 1, and I feel like he is at low risk given his age of 41. With the low burden of atrial fibrillation it is very reasonable.  He reports no further reoccurrence of atrial fibrillation or flutter. Recently he's been having problems with high blood pressure and notes that he has several intolerances and/or serious allergies to medications, including angioedema to ACE inhibitors and ARB medications.  He apparently has an allergy to hydrochlorothiazide, but does not recall whether that was rash or not.  Mr. Godlewski returns today for follow-up. He is without complaint. He continues to do both aerobic and resistance exercise without any limitations. In fact she's put on a lot of muscle mass. He denies any chest pain or shortness of breath. He's had no recurrent palpitations or atrial fibrillation or atrial flutter. He has well-controlled blood pressure. He denies any edema on the chlorthalidone which was started at his last office visit.  PMHx:  Past Medical History  Diagnosis Date  . Hypertension   .  Anxiety   . Pneumonia   . RAD (reactive airway disease)   . Allergic angioedema   . Atrial flutter     Past Surgical History  Procedure Laterality Date  . Appendectomy    . Tonsillectomy    . Colonoscopy    . Cholecystectomy    . Stress myoview  12/13/2008    EF 61% LV  normal    FAMHx:  Family History  Problem Relation Age of Onset  . Pancreatic cancer Mother   . Hypertension Mother   . Cancer Mother   . Hypertension Sister   . Asthma Son   . Stroke Father   . Hypertension Father   . Hypertension Brother   . Hypertension Daughter   . Cancer Maternal Grandfather     SOCHx:   reports that he quit smoking about 18 years ago. His smoking use included Cigarettes and Pipe. He has quit using smokeless tobacco. His smokeless tobacco use included Chew. He reports that he does not drink alcohol or use illicit drugs.  ALLERGIES:  Allergies  Allergen Reactions  . Ibuprofen Swelling  . Lisinopril Swelling    Swelling of face and lips   . Oxaprozin Swelling    Swelling of face and lips  . Ziac [Bisoprolol-Hydrochlorothiazide] Swelling    Per patient records from Dr. Florina Ou.  Thayer Jew Hcl] Itching  . Hctz [Hydrochlorothiazide]   . Norvasc [Amlodipine Besylate]     ROS: A comprehensive review of systems was negative.  HOME MEDS:  Current Outpatient Prescriptions  Medication Sig Dispense Refill  . ALPRAZolam (XANAX) 0.25 MG tablet Take 1 tablet (0.25 mg total) by mouth 2 (two) times daily as needed for anxiety. 30 tablet 2  . cetirizine (ZYRTEC) 10 MG tablet Take 10 mg by mouth daily.    . chlorthalidone (HYGROTON) 25 MG tablet Take 1 tablet (25 mg total) by mouth daily. 30 tablet 3  . diltiazem (CARDIZEM CD) 300 MG 24 hr capsule TAKE 1 CAPSULE (300 MG TOTAL) BY MOUTH DAILY. <PLEASE MAKE APPOINTMENT FOR REFILLS> 15 capsule 0  . EPINEPHrine (EPI-PEN) 0.3 mg/0.3 mL DEVI Inject 0.3 mLs (0.3 mg total) into the muscle once. 1 Device 1  . metoprolol succinate  (TOPROL-XL) 100 MG 24 hr tablet Take 50 mg by mouth daily. Takes 50mg  in PM if needed for BP     No current facility-administered medications for this visit.    LABS/IMAGING: No results found for this or any previous visit (from the past 48 hour(s)). No results found.  VITALS: BP 140/72 mmHg  Pulse 76  Ht 6\' 2"  (1.88 m)  Wt 274 lb 6.4 oz (124.467 kg)  BMI 35.22 kg/m2  EXAM: General appearance: alert and no distress Neck: no adenopathy, no carotid bruit, no JVD, supple, symmetrical, trachea midline and thyroid not enlarged, symmetric, no tenderness/mass/nodules Lungs: clear to auscultation bilaterally Heart: regular rate and rhythm, S1, S2 normal, no murmur, click, rub or gallop Abdomen: soft, non-tender; bowel sounds normal; no masses,  no organomegaly Extremities: edema trace edema Pulses: 2+ and symmetric Skin: Skin color, texture, turgor normal. No rashes or lesions Neurologic: Grossly normal  EKG: Sinus rhythm at 76  ASSESSMENT: 1. Hypertension 2. Paroxysmal atrial flutter without recurrence, low CHADSVASC score  PLAN: 1.   Mr. Frommelt has had no real recurrence of atrial flutter in the past 2 years. He was very symptomatic with it and therefore could identify whether he is having it or not. He has since discontinued aspirin and I don't see a clear indication for that at this time. Should he has some reoccurrence it would be reasonable to retreat him with aspirin. He does have hypertension however that is now well controlled. Continues to exercise at a high rate and denies any anginal symptoms or shortness of breath. Overall he is doing really well and is scheduled to have primary care visit next couple months. I'll plan to see him back annually or sooner as necessary.  Pixie Casino, MD, [facility] Attending Cardiologist [facility] 02/01/2015, 4:34 PM

## 2015-02-16 ENCOUNTER — Other Ambulatory Visit: Payer: Self-pay | Admitting: Internal Medicine

## 2015-02-16 NOTE — Telephone Encounter (Signed)
Rx(s) sent to pharmacy electronically.  

## 2015-03-20 ENCOUNTER — Ambulatory Visit (INDEPENDENT_AMBULATORY_CARE_PROVIDER_SITE_OTHER): Payer: Medicare HMO | Admitting: Emergency Medicine

## 2015-03-20 ENCOUNTER — Encounter: Payer: Self-pay | Admitting: Emergency Medicine

## 2015-03-20 VITALS — BP 131/74 | HR 80 | Temp 98.1°F | Resp 16 | Ht 74.0 in | Wt 272.0 lb

## 2015-03-20 DIAGNOSIS — Z0000 Encounter for general adult medical examination without abnormal findings: Secondary | ICD-10-CM

## 2015-03-20 DIAGNOSIS — Z125 Encounter for screening for malignant neoplasm of prostate: Secondary | ICD-10-CM | POA: Diagnosis not present

## 2015-03-20 DIAGNOSIS — R739 Hyperglycemia, unspecified: Secondary | ICD-10-CM | POA: Diagnosis not present

## 2015-03-20 DIAGNOSIS — I4891 Unspecified atrial fibrillation: Secondary | ICD-10-CM | POA: Diagnosis not present

## 2015-03-20 DIAGNOSIS — Z23 Encounter for immunization: Secondary | ICD-10-CM

## 2015-03-20 DIAGNOSIS — E785 Hyperlipidemia, unspecified: Secondary | ICD-10-CM

## 2015-03-20 DIAGNOSIS — I10 Essential (primary) hypertension: Secondary | ICD-10-CM | POA: Diagnosis not present

## 2015-03-20 LAB — LIPID PANEL
Cholesterol: 161 mg/dL (ref 0–200)
HDL: 37 mg/dL — ABNORMAL LOW (ref >=40)
LDL CALC: 100 mg/dL — AB (ref 0–99)
Total CHOL/HDL Ratio: 4.4 Ratio
Triglycerides: 119 mg/dL (ref <150)
VLDL: 24 mg/dL (ref 0–40)

## 2015-03-20 LAB — POCT URINALYSIS DIPSTICK
Bilirubin, UA: NEGATIVE
Glucose, UA: NEGATIVE
KETONES UA: NEGATIVE
LEUKOCYTES UA: NEGATIVE
NITRITE UA: NEGATIVE
PROTEIN UA: NEGATIVE
SPEC GRAV UA: 1.01
UROBILINOGEN UA: 0.2
pH, UA: 6.5

## 2015-03-20 LAB — COMPLETE METABOLIC PANEL WITH GFR
ALK PHOS: 60 U/L (ref 39–117)
ALT: 32 U/L (ref 0–53)
AST: 28 U/L (ref 0–37)
Albumin: 4.2 g/dL (ref 3.5–5.2)
BUN: 19 mg/dL (ref 6–23)
CO2: 27 mEq/L (ref 19–32)
Calcium: 9.4 mg/dL (ref 8.4–10.5)
Chloride: 100 mEq/L (ref 96–112)
Creat: 1 mg/dL (ref 0.50–1.35)
GFR, EST NON AFRICAN AMERICAN: 78 mL/min
GFR, Est African American: >89 mL/min
Glucose, Bld: 129 mg/dL — ABNORMAL HIGH (ref 70–99)
Potassium: 4.1 mEq/L (ref 3.5–5.3)
Sodium: 138 mEq/L (ref 135–145)
Total Bilirubin: 0.6 mg/dL (ref 0.2–1.2)
Total Protein: 7.2 g/dL (ref 6.0–8.3)

## 2015-03-20 LAB — HEMOGLOBIN A1C
Hgb A1c MFr Bld: 6.5 % — ABNORMAL HIGH (ref <5.7)
Mean Plasma Glucose: 140 mg/dL — ABNORMAL HIGH (ref <117)

## 2015-03-20 NOTE — Progress Notes (Addendum)
   Subjective:  This chart was scribe for [name], MD by Judithann Sauger, ED Scribe. The patient was seen in Room 23 and the patient's care was started at 9:44 AM.    Patient ID: [name], male    DOB: [birth year], 69 y.o.   MRN: [id number] Chief Complaint  Patient presents with  . Annual Exam    HPI HPI Comments: [name] is a 69 y.o. male who presents to the [facility] for an annual exam. His BP today is 131/74 but he reports that his BP has been around 125 when he is at home. He denies receiving the Prevnar vaccine but he received his flu vaccine this year. He states that he has not had any allergic reactions to anything since his last visit a year go. He reports that he had an EKG a month ago and it was normal. He adds that he visits his Cardiologist annually. He reports that he was placed on Xarelto but stopped taking it because of price and had not been taking a daily baby aspirin. He reports that he is UTD on his colonoscopy.   Past Medical History  Diagnosis Date  . Hypertension   . Anxiety   . Pneumonia   . RAD (reactive airway disease)   . Allergic angioedema   . Atrial flutter    Past Surgical History  Procedure Laterality Date  . Appendectomy    . Tonsillectomy    . Colonoscopy    . Cholecystectomy    . Stress myoview  12/13/2008    EF 61% LV  normal   Allergies  Allergen Reactions  . Ibuprofen Swelling  . Lisinopril Swelling    Swelling of face and lips   . Oxaprozin Swelling    Swelling of face and lips  . Ziac [Bisoprolol-Hydrochlorothiazide] Swelling    Per patient records from Dr. Florina Ou.  Thayer Jew Hcl] Itching  . Hctz [Hydrochlorothiazide]   . Norvasc [Amlodipine Besylate]       Review of Systems  Constitutional: Negative for fever and chills.  HENT: Negative for congestion.   Respiratory: Negative for shortness of breath.   Cardiovascular: Negative for chest pain.  Gastrointestinal:  Negative for nausea, vomiting, abdominal pain and diarrhea.  Musculoskeletal: Negative for myalgias and joint swelling.  Skin: Negative for rash.  Psychiatric/Behavioral: Negative for confusion.       Objective:   Physical Exam  Vitals reviewed.  CONSTITUTIONAL: Well developed/well nourished HEAD: Normocephalic/atraumatic EYES: EOMI/PERRL ENMT: Mucous membranes moist NECK: supple no meningeal signs SPINE/BACK:entire spine nontender CV: S1/S2 noted, no murmurs/rubs/gallops noted LUNGS: Lungs are clear to auscultation bilaterally, no apparent distress ABDOMEN: soft, nontender, no rebound or guarding, bowel sounds noted throughout abdomen GU:no cva tenderness NEURO: Pt is awake/alert/appropriate, moves all extremitiesx4.  No facial droop.   EXTREMITIES: pulses normal/equal, full ROM SKIN: warm, color normal PSYCH: no abnormalities of mood noted, alert and oriented to situation  Prostate is normal size without nodule    Assessment & Plan:  . He has a history of  of atrial flutter. He has had no recurrences. He is tolerating his current blood pressure medications without difficulty. Prevnar will be given today. He sees Dr. Debara Pickett .  Marland KitchenI personally performed the services described in this documentation, which was scribed in my presence. The recorded information has been reviewed and is accurate.

## 2015-03-20 NOTE — Progress Notes (Deleted)
   Subjective:    Patient ID: [name], male    DOB: 15-May-1946, 69 y.o.   MRN: [id number]  HPI    Review of Systems     Objective:   Physical Exam        Assessment & Plan:

## 2015-03-21 LAB — PSA, MEDICARE: PSA: 0.91 ng/mL (ref <=4.00)

## 2015-03-22 ENCOUNTER — Other Ambulatory Visit: Payer: Self-pay | Admitting: Radiology

## 2015-03-22 MED ORDER — METFORMIN HCL 500 MG PO TABS: 500.0000 mg | ORAL_TABLET | ORAL | Status: DC

## 2015-03-23 ENCOUNTER — Telehealth: Payer: Self-pay | Admitting: Family Medicine

## 2015-03-23 NOTE — Telephone Encounter (Signed)
That would be fine. He needs to be careful about carb intake and work on his weight. We can recheck his level in 3-4 months and make a decision at that time about medications. I am not adamant that he start on medication. If he wants to try diet and weight loss first that would be fine

## 2015-03-23 NOTE — Telephone Encounter (Signed)
Pt would like to know if it would to know he can try to attempt to control his diabetes with diet before trying to control this with medication.  Please advise  cell   [phone]

## 2015-03-23 NOTE — Telephone Encounter (Signed)
Spoke with pt's wife, advised pt to call back.

## 2015-03-26 NOTE — Telephone Encounter (Signed)
lmom to cb. 

## 2015-03-27 ENCOUNTER — Telehealth: Payer: Self-pay

## 2015-03-27 NOTE — Telephone Encounter (Signed)
Patient is returning missed phone call. Please call! [phone]

## 2015-03-27 NOTE — Telephone Encounter (Signed)
Pt missed his call and would like a CB. PLease advise at [phone]

## 2015-03-27 NOTE — Telephone Encounter (Signed)
Called pt--no answer and no voicemail set up.

## 2015-04-18 ENCOUNTER — Other Ambulatory Visit: Payer: Self-pay | Admitting: Internal Medicine

## 2015-04-28 ENCOUNTER — Other Ambulatory Visit: Payer: Self-pay | Admitting: Internal Medicine

## 2015-05-28 ENCOUNTER — Telehealth: Payer: Self-pay

## 2015-05-28 DIAGNOSIS — R739 Hyperglycemia, unspecified: Secondary | ICD-10-CM

## 2015-05-28 NOTE — Telephone Encounter (Signed)
Okay to put in an orders only encounter for a hemoglobin A1c

## 2015-05-28 NOTE — Telephone Encounter (Signed)
Future order placed. LMOM letting pt know

## 2015-05-28 NOTE — Telephone Encounter (Signed)
Pt called. Wanted a reminder of when he needed a recheck. He says he has lost 15 lbs! Wanted to know if you wanted to see him or if he could do a blood draw only for the A1C

## 2015-08-14 ENCOUNTER — Ambulatory Visit (INDEPENDENT_AMBULATORY_CARE_PROVIDER_SITE_OTHER): Payer: Medicare HMO

## 2015-08-16 ENCOUNTER — Emergency Department (HOSPITAL_COMMUNITY): Payer: Medicare HMO

## 2015-08-16 ENCOUNTER — Ambulatory Visit (INDEPENDENT_AMBULATORY_CARE_PROVIDER_SITE_OTHER): Payer: Medicare HMO

## 2015-08-16 ENCOUNTER — Ambulatory Visit (INDEPENDENT_AMBULATORY_CARE_PROVIDER_SITE_OTHER): Payer: Medicare HMO | Admitting: Emergency Medicine

## 2015-08-16 ENCOUNTER — Encounter (HOSPITAL_COMMUNITY): Payer: Self-pay | Admitting: *Deleted

## 2015-08-16 ENCOUNTER — Encounter: Payer: Self-pay | Admitting: Emergency Medicine

## 2015-08-16 ENCOUNTER — Emergency Department (HOSPITAL_COMMUNITY)
Admission: EM | Admit: 2015-08-16 | Discharge: 2015-08-16 | Disposition: A | Discharge status: Home / Self Care | Payer: Medicare HMO | Attending: Emergency Medicine | Admitting: Emergency Medicine

## 2015-08-16 VITALS — BP 140/80 | HR 88 | Temp 99.2°F | Resp 16 | Ht 74.0 in | Wt 258.6 lb

## 2015-08-16 DIAGNOSIS — J45909 Unspecified asthma, uncomplicated: Secondary | ICD-10-CM | POA: Diagnosis not present

## 2015-08-16 DIAGNOSIS — R109 Unspecified abdominal pain: Secondary | ICD-10-CM

## 2015-08-16 DIAGNOSIS — Z79899 Other long term (current) drug therapy: Secondary | ICD-10-CM | POA: Insufficient documentation

## 2015-08-16 DIAGNOSIS — R509 Fever, unspecified: Secondary | ICD-10-CM

## 2015-08-16 DIAGNOSIS — Z131 Encounter for screening for diabetes mellitus: Secondary | ICD-10-CM | POA: Diagnosis not present

## 2015-08-16 DIAGNOSIS — Z87891 Personal history of nicotine dependence: Secondary | ICD-10-CM | POA: Insufficient documentation

## 2015-08-16 DIAGNOSIS — I10 Essential (primary) hypertension: Secondary | ICD-10-CM | POA: Diagnosis not present

## 2015-08-16 DIAGNOSIS — R1030 Lower abdominal pain, unspecified: Secondary | ICD-10-CM | POA: Diagnosis not present

## 2015-08-16 DIAGNOSIS — Z8701 Personal history of pneumonia (recurrent): Secondary | ICD-10-CM | POA: Diagnosis not present

## 2015-08-16 DIAGNOSIS — I4892 Unspecified atrial flutter: Secondary | ICD-10-CM | POA: Diagnosis not present

## 2015-08-16 DIAGNOSIS — R5383 Other fatigue: Secondary | ICD-10-CM | POA: Insufficient documentation

## 2015-08-16 DIAGNOSIS — R1033 Periumbilical pain: Secondary | ICD-10-CM | POA: Diagnosis not present

## 2015-08-16 DIAGNOSIS — Z8659 Personal history of other mental and behavioral disorders: Secondary | ICD-10-CM | POA: Diagnosis not present

## 2015-08-16 LAB — BASIC METABOLIC PANEL
Anion gap: 7 (ref 5–15)
BUN: 23 mg/dL — AB (ref 6–20)
CHLORIDE: 99 mmol/L — AB (ref 101–111)
CO2: 28 mmol/L (ref 22–32)
Calcium: 8.8 mg/dL — ABNORMAL LOW (ref 8.9–10.3)
Creatinine, Ser: 1.04 mg/dL (ref 0.61–1.24)
GFR calc Af Amer: >60 mL/min (ref >60)
GFR calc non Af Amer: >60 mL/min (ref >60)
Glucose, Bld: 130 mg/dL — ABNORMAL HIGH (ref 65–99)
Potassium: 3.4 mmol/L — ABNORMAL LOW (ref 3.5–5.1)
Sodium: 134 mmol/L — ABNORMAL LOW (ref 135–145)

## 2015-08-16 LAB — URINALYSIS, ROUTINE W REFLEX MICROSCOPIC
BILIRUBIN URINE: NEGATIVE
Glucose, UA: NEGATIVE mg/dL
Ketones, ur: NEGATIVE mg/dL
Leukocytes, UA: NEGATIVE
NITRITE: NEGATIVE
Protein, ur: NEGATIVE mg/dL
SPECIFIC GRAVITY, URINE: 1.03 (ref 1.005–1.030)
UROBILINOGEN UA: 0.2 mg/dL (ref 0.0–1.0)
pH: 6.5 (ref 5.0–8.0)

## 2015-08-16 LAB — POCT CBC
Granulocyte percent: 50.2 %G (ref 37–80)
HEMATOCRIT: 46.3 % (ref 43.5–53.7)
HEMOGLOBIN: 15.8 g/dL (ref 14.1–18.1)
Lymph, poc: 2 (ref 0.6–3.4)
MCH: 30.4 pg (ref 27–31.2)
MCHC: 34.2 g/dL (ref 31.8–35.4)
MCV: 88.7 fL (ref 80–97)
MID (cbc): 0.6 (ref 0–0.9)
MPV: 6.1 fL (ref 0–99.8)
POC GRANULOCYTE: 2.7 (ref 2–6.9)
POC LYMPH PERCENT: 38 %L (ref 10–50)
POC MID %: 11.8 % (ref 0–12)
Platelet Count, POC: 259 10*3/uL (ref 142–424)
RBC: 5.22 M/uL (ref 4.69–6.13)
RDW, POC: 13.3 %
WBC: 5.3 10*3/uL (ref 4.6–10.2)

## 2015-08-16 LAB — GLUCOSE, POCT (MANUAL RESULT ENTRY): POC Glucose: 138 mg/dl — AB (ref 70–99)

## 2015-08-16 LAB — POCT GLYCOSYLATED HEMOGLOBIN (HGB A1C): Hemoglobin A1C: 7.2

## 2015-08-16 LAB — URINE MICROSCOPIC-ADD ON

## 2015-08-16 MED ORDER — IOHEXOL 300 MG/ML  SOLN
100.0000 mL | Freq: Once | INTRAMUSCULAR | Status: AC | PRN
  Administered 2015-08-16: 100 mL via INTRAVENOUS

## 2015-08-16 NOTE — Discharge Instructions (Signed)
No specific cause for your symptoms was found in your testing today.  No specific recommendations to make.  However, if you develop fever, worsening pain, or other changes please recheck here with your primary care physician

## 2015-08-16 NOTE — Progress Notes (Addendum)
Patient ID: [name], male   DOB: Sep 16, 1946, 70 y.o.   MRN: [id number]     This chart was scribed for [name], MD by Zola Button, Medical Scribe. This patient was seen in room 29 and the patient's care was started at 10:26 AM.   Chief Complaint:  Chief Complaint  Patient presents with  . Fever    x 1 week  runs from 99 to 101  . Abdominal Pain    soar all over  . Constipation    HPI: [name] is a 69 y.o. male with a history of atrial flutter who reports to [facility] today complaining of abdominal pain that started 10 days ago. Patient also reports having fever and intermittent constipation. His temperature was 100.6 F two days ago. The pain is worse with palpation and is described as a tenderness. Patient denies nausea, vomiting, and blood in stool. His last colonoscopy 3 years ago by Dr. Sharlett Iles revealed diverticuloses. PSHx includes appendectomy. He is unsure if he has allergies to contrast.  Past Medical History  Diagnosis Date  . Hypertension   . Anxiety   . Pneumonia   . RAD (reactive airway disease)   . Allergic angioedema   . Atrial flutter    Past Surgical History  Procedure Laterality Date  . Appendectomy    . Tonsillectomy    . Colonoscopy    . Cholecystectomy    . Stress myoview  12/13/2008    EF 61% LV  normal   Social History   Social History  . Marital Status: Married    Spouse Name: N/A  . Number of Children: N/A  . Years of Education: N/A   Social History Main Topics  . Smoking status: Former Smoker    Types: Cigarettes, Pipe    Quit date: 10/05/1996  . Smokeless tobacco: Former Systems developer    Types: Chew     Comment: Occasionally  . Alcohol Use: No  . Drug Use: No  . Sexual Activity: Yes    Birth Control/ Protection: None   Other Topics Concern  . None   Social History Narrative   Family History  Problem Relation Age of Onset  . Pancreatic cancer Mother   . Hypertension Mother   . Cancer Mother   . Hypertension Sister     . Asthma Son   . Stroke Father   . Hypertension Father   . Hypertension Brother   . Hypertension Daughter   . Cancer Maternal Grandfather    Allergies  Allergen Reactions  . Ibuprofen Swelling  . Lisinopril Swelling    Swelling of face and lips   . Oxaprozin Swelling    Swelling of face and lips  . Ziac [Bisoprolol-Hydrochlorothiazide] Swelling    Per patient records from Dr. Florina Ou.  Thayer Jew Hcl] Itching  . Hctz [Hydrochlorothiazide]   . Norvasc [Amlodipine Besylate]    Prior to Admission medications   Medication Sig Start Date End Date Taking? Authorizing Provider  cetirizine (ZYRTEC) 10 MG tablet Take 10 mg by mouth daily.   Yes Historical Provider, MD  chlorthalidone (HYGROTON) 25 MG tablet TAKE 1 TABLET BY MOUTH EVERY DAY 04/18/15  Yes Pixie Casino, MD  diltiazem (CARDIZEM CD) 300 MG 24 hr capsule Take 1 capsule (300 mg total) by mouth daily. 02/16/15  Yes Pixie Casino, MD  EPINEPHrine (EPI-PEN) 0.3 mg/0.3 mL DEVI Inject 0.3 mLs (0.3 mg total) into the muscle once. 10/19/12  Yes Theda Sers, PA-C  metoprolol succinate (TOPROL-XL) 100 MG 24 hr tablet Take 50 mg by mouth daily. Takes 50mg  in PM if needed for BP 04/15/13  Yes [name], MD  metFORMIN (GLUCOPHAGE) 500 MG tablet Take 1 tablet (500 mg total) by mouth 1 day or 1 dose. Take one pill per day for one week, then increase to two pills per day. Patient not taking: Reported on 08/14/2015 03/22/15   [name], MD  metoprolol succinate (TOPROL-XL) 100 MG 24 hr tablet TAKE 1 TABLET BY MOUTH EVERY DAY WITH OR IMMEDIATELY FOLLOWING A MEAL Patient not taking: Reported on 08/16/2015 04/30/15   Pixie Casino, MD     ROS: The patient denies unintentional weight loss, chest pain, palpitations, wheezing, dyspnea on exertion, nausea, vomiting, dysuria, hematuria, melena, numbness, weakness, or tingling.   All other systems have been reviewed and were otherwise negative with the exception of those mentioned  in the HPI and as above.    PHYSICAL EXAM: Filed Vitals:   08/16/15 1022  BP: 140/80  Pulse: 88  Temp: 99.2 F (37.3 C)  Resp: 16   Body mass index is 33.19 kg/(m^2).   General: Alert, no acute distress HEENT:  Normocephalic, atraumatic, oropharynx patent. Eye: [facility]te [facility] Cardiovascular:  Regular rate and rhythm, no rubs murmurs or gallops.  No Carotid bruits, radial pulse intact. No pedal edema.  Respiratory: Clear to auscultation bilaterally.  No wheezes, rales, or rhonchi.  No cyanosis, no use of accessory musculature Abdominal patient has significant tenderness in the midepigastrium and area around the splenic flexure. There are no masses palpable. There was no rebound noted. Abdomen was not distended. Musculoskeletal: Gait intact. No edema, tenderness Skin: No rashes. Neurologic: Facial musculature symmetric. Psychiatric: Patient acts appropriately throughout our interaction. Lymphatic: No cervical or submandibular lymphadenopathy Genitourinary/Anorectal: No acute findings    LABS:. Results for orders placed or performed in visit on 08/16/15  POCT CBC  Result Value Ref Range   WBC 5.3 4.6 - 10.2 K/uL   Lymph, poc 2.0 0.6 - 3.4   POC LYMPH PERCENT 38.0 10 - 50 %L   MID (cbc) 0.6 0 - 0.9   POC MID % 11.8 0 - 12 %M   POC Granulocyte 2.7 2 - 6.9   Granulocyte percent 50.2 37 - 80 %G   RBC 5.22 4.69 - 6.13 M/uL   Hemoglobin 15.8 14.1 - 18.1 g/dL   HCT, POC 46.3 43.5 - 53.7 %   MCV 88.7 80 - 97 fL   MCH, POC 30.4 27 - 31.2 pg   MCHC 34.2 31.8 - 35.4 g/dL   RDW, POC 13.3 %   Platelet Count, POC 259.0 142 - 424 K/uL   MPV 6.1 0 - 99.8 fL  POCT glucose (manual entry)  Result Value Ref Range   POC Glucose 138 (A) 70 - 99 mg/dl  POCT glycosylated hemoglobin (Hb A1C)  Result Value Ref Range   Hemoglobin A1C 7.2       EKG/XRAY:   Primary read interpreted by Dr. Everlene Farrier at [facility]. This is sinus rhythm with T-wave changes there is no free air. There is no evidence of  obstruction. Please comment on soft tissue shadow right abdomen adjacent to the L3-4 vertebrae.   ASSESSMENT/PLAN:  Very concerned this represents acute diverticulitis. It could be pancreatitis or cholecystitis but he is known to have multiple diverticuli. We'll proceed with CBC,urine, and acute abdominal series. Plan will be to send to the emergency room at [facility] so they  can perform scanning. He has multiple allergies with history of angioedema and will need to be observed closely. Urine was still pending at the time he was sent to the emergency room. Hemoglobin A1c was up to 7.2 we'll address treatment of this on his follow-up visit.I personally performed the services described in this documentation, which was scribed in my presence. The recorded information has been reviewed and is accurate.       Gross sideeffects, risk and benefits, and alternatives of medications d/w patient. Patient is aware that all medications have potential sideeffects and we are unable to predict every sideeffect or drug-drug interaction that may occur.  [name] MD 08/16/2015 10:33 AM

## 2015-08-16 NOTE — ED Notes (Addendum)
Pt sent here from PCP for possible acute diverticulitis, pancreatitis or cholecystitis. Pt had CBC,urinalysis, abdominal xray performed. Pt states his lower abdominal pain began 7 days ago, pt denies n/v/d. Pt states his pain is worse upon palpation. Pt denies pain at this time, states he has some fatigue for the past 7 days.

## 2015-08-16 NOTE — ED Notes (Signed)
Nurse drawing labs. 

## 2015-08-16 NOTE — ED Provider Notes (Signed)
CSN: [id number]     Arrival date & time 08/16/15  1203 History   First MD Initiated Contact with Patient 08/16/15 1214     Chief Complaint  Patient presents with  . Abdominal Pain  . Fatigue      HPI  Patient presents evaluation of lower abdominal pain.  Describe symptoms for 7-10 days of intermittent or abdominal pain described as a cramping. No diarrhea or constipation. No fever. She checked his temperature 2 nights ago and it was "100". No blood in stools. No urinary symptoms. Seen by his primary care physician today and sent in for evaluation. Negative white blood cell count. Had a negative abdominal x-ray.  Past Medical History  Diagnosis Date  . Hypertension   . Anxiety   . Pneumonia   . RAD (reactive airway disease)   . Allergic angioedema   . Atrial flutter    Past Surgical History  Procedure Laterality Date  . Appendectomy    . Tonsillectomy    . Colonoscopy    . Cholecystectomy    . Stress myoview  12/13/2008    EF 61% LV  normal   Family History  Problem Relation Age of Onset  . Pancreatic cancer Mother   . Hypertension Mother   . Cancer Mother   . Hypertension Sister   . Asthma Son   . Stroke Father   . Hypertension Father   . Hypertension Brother   . Hypertension Daughter   . Cancer Maternal Grandfather    Social History  Substance Use Topics  . Smoking status: Former Smoker    Types: Cigarettes, Pipe    Quit date: 10/05/1996  . Smokeless tobacco: Former Systems developer    Types: Chew     Comment: Occasionally  . Alcohol Use: No    Review of Systems  Constitutional: Negative for fever, chills, diaphoresis, appetite change and fatigue.  HENT: Negative for mouth sores, sore throat and trouble swallowing.   Eyes: Negative for visual disturbance.  Respiratory: Negative for cough, chest tightness, shortness of breath and wheezing.   Cardiovascular: Negative for chest pain.  Gastrointestinal: Positive for abdominal pain. Negative for nausea, vomiting,  diarrhea and abdominal distention.  Endocrine: Negative for polydipsia, polyphagia and polyuria.  Genitourinary: Negative for dysuria, frequency and hematuria.  Musculoskeletal: Negative for gait problem.  Skin: Negative for color change, pallor and rash.  Neurological: Negative for dizziness, syncope, light-headedness and headaches.  Hematological: Does not bruise/bleed easily.  Psychiatric/Behavioral: Negative for behavioral problems and confusion.      Allergies  Ibuprofen; Lisinopril; Oxaprozin; Ziac; Bystolic; Hctz; and Norvasc  Home Medications   Prior to Admission medications   Medication Sig Start Date End Date Taking? Authorizing Provider  acetaminophen (TYLENOL) 500 MG tablet Take 1,000 mg by mouth every 4 (four) hours as needed for mild pain.   Yes Historical Provider, MD  cetirizine (ZYRTEC) 10 MG tablet Take 10 mg by mouth daily.   Yes Historical Provider, MD  chlorthalidone (HYGROTON) 25 MG tablet TAKE 1 TABLET BY MOUTH EVERY DAY 04/18/15  Yes Pixie Casino, MD  diltiazem (CARDIZEM CD) 300 MG 24 hr capsule Take 1 capsule (300 mg total) by mouth daily. 02/16/15  Yes Pixie Casino, MD  EPINEPHrine (EPI-PEN) 0.3 mg/0.3 mL DEVI Inject 0.3 mLs (0.3 mg total) into the muscle once. 10/19/12  Yes Eleanore Kurtis Bushman, PA-C  Menthol-Camphor (TIGER BALM ARTHRITIS RUB EX) Apply 1 application topically 2 (two) times daily as needed (aching).   Yes Historical Provider, MD  metoprolol succinate (TOPROL-XL) 100 MG 24 hr tablet TAKE 1 TABLET BY MOUTH EVERY DAY WITH OR IMMEDIATELY FOLLOWING A MEAL Patient taking differently: TAKE 1/2 TABLET BY MOUTH EVERY DAY WITH OR IMMEDIATELY FOLLOWING A MEAL 04/30/15  Yes Pixie Casino, MD  metFORMIN (GLUCOPHAGE) 500 MG tablet Take 1 tablet (500 mg total) by mouth 1 day or 1 dose. Take one pill per day for one week, then increase to two pills per day. Patient not taking: Reported on 08/14/2015 03/22/15   [name], MD   BP 134/84 mmHg  Pulse 85   Temp(Src) 98.3 F (36.8 C) (Oral)  Resp 16  SpO2 97% Physical Exam  Constitutional: He is oriented to person, place, and time. He appears well-developed and well-nourished. No distress.  HENT:  Head: Normocephalic.  Eyes: Conjunctivae are normal. Pupils are equal, round, and reactive to light. No scleral icterus.  Neck: Normal range of motion. Neck supple. No thyromegaly present.  Cardiovascular: Normal rate and regular rhythm.  Exam reveals no gallop and no friction rub.   No murmur heard. Pulmonary/Chest: Effort normal and breath sounds normal. No respiratory distress. He has no wheezes. He has no rales.  Abdominal: Soft. Bowel sounds are normal. He exhibits no distension. There is no tenderness. There is no rebound.    Musculoskeletal: Normal range of motion.  Neurological: He is alert and oriented to person, place, and time.  Skin: Skin is warm and dry. No rash noted.  Psychiatric: He has a normal mood and affect. His behavior is normal.    ED Course  Procedures (including critical care time) Labs Review Labs Reviewed  BASIC METABOLIC PANEL - Abnormal; Notable for the following:    Sodium 134 (*)    Potassium 3.4 (*)    Chloride 99 (*)    Glucose, Bld 130 (*)    BUN 23 (*)    Calcium 8.8 (*)    All other components within normal limits  URINALYSIS, ROUTINE W REFLEX MICROSCOPIC (NOT AT [facility])    Imaging Review Ct Abdomen Pelvis W Contrast  08/16/2015   CLINICAL DATA:  Abdominal pain and fatigue for 7 days. Initial encounter.  EXAM: CT ABDOMEN AND PELVIS WITH CONTRAST  TECHNIQUE: Multidetector CT imaging of the abdomen and pelvis was performed using the standard protocol following bolus administration of intravenous contrast.  CONTRAST:  100 mL OMNIPAQUE IOHEXOL 300 MG/ML  SOLN  COMPARISON:  None.  FINDINGS: The lung bases are clear. There is no pleural or pericardial effusion. Heart size is normal.  The liver is low attenuating consistent with fatty infiltration. No focal  liver lesion is seen. The gallbladder, adrenal glands, spleen, pancreas and right kidney appear normal. Small left renal cyst is incidentally noted. There is aortoiliac atherosclerosis without aneurysm.  The prostate gland is mildly prominent. Seminal vesicles and urinary bladder are unremarkable. Small fat containing left inguinal hernia is noted. A few sigmoid diverticula are seen but there is no evidence of diverticulitis. The colon is otherwise unremarkable. The appendix is been removed. The stomach and small bowel appear normal.  No worrisome lytic or sclerotic bony lesion is seen. There is atrophy of the rectus musculature bilaterally which appears somewhat worse on the right.  IMPRESSION: No acute abnormality or finding to explain the patient's symptoms.  Fatty infiltration of the liver.  Aortoiliac atherosclerosis without aneurysm.  Sigmoid diverticulosis without diverticulitis.  Small fat containing left inguinal hernia.  Mild prostatomegaly.   Electronically Signed   By: Inge Rise  M.D.   On: 08/16/2015 14:23   Dg Abd Acute W/chest  08/16/2015   CLINICAL DATA:  Abdominal pain began 10 days ago fever  EXAM: DG ABDOMEN ACUTE W/ 1V CHEST  COMPARISON:  Chest x-ray of 02/03/2012  FINDINGS: The lungs remain clear and somewhat hyperaerated. Mediastinal and hilar contours are unremarkable. The heart is within normal limits in size.  Supine and erect views of the abdomen show no bowel obstruction. No free air is seen on the erect view of the chest. No opaque calculi are noted. There are degenerative changes in the lumbar spine.  IMPRESSION: 1. No active lung disease.  No change in hyper aeration. 2. No bowel obstruction.  No free air.   Electronically Signed   By: Ivar Drape M.D.   On: 08/16/2015 11:40   I have personally reviewed and evaluated these images and lab results as part of my medical decision-making.   EKG Interpretation None      MDM   Final diagnoses:  None    Patient abdomen is  benign and minimally symptomatic here. Has a CT scan that she does not show any acute process. Mild enlargement of the prostate. No leukocytosis. Stable vitals. Afebrile. Urine is pending. If his urine proves negative acutely appropriate for expectant management and outpatient treatment.    Tanna Furry, MD 08/16/15 1525

## 2015-08-17 ENCOUNTER — Telehealth: Payer: Self-pay | Admitting: Family Medicine

## 2015-08-17 ENCOUNTER — Telehealth: Payer: Self-pay | Admitting: Emergency Medicine

## 2015-08-17 NOTE — Telephone Encounter (Signed)
Spoke with pt, advised message from Dr. Everlene Farrier. He will follow up this weekend, he is not much better.

## 2015-08-17 NOTE — Telephone Encounter (Signed)
Please call patient and check on status. Be sure he follows up with me this weekend if he is not getting better.

## 2015-08-17 NOTE — Telephone Encounter (Signed)
Patient was seen yesterday in office and was sent to Hospital. He was released from ED with no Diagnosis. He said they could not find anything. He is running a fever today again. He is questioning if he should be checked for Lyme's Disease or [place] Spotted fever. He does work outside often and has had ticks on him a few times this summer. I did notice he did not have labs sent out from our office and those particular tests were not performed at Hospital. Please Advise.

## 2015-08-18 ENCOUNTER — Ambulatory Visit (INDEPENDENT_AMBULATORY_CARE_PROVIDER_SITE_OTHER): Payer: Medicare HMO | Admitting: Emergency Medicine

## 2015-08-18 VITALS — BP 136/60 | HR 79 | Temp 98.3°F | Resp 18 | Ht 73.0 in | Wt 261.2 lb

## 2015-08-18 DIAGNOSIS — R509 Fever, unspecified: Secondary | ICD-10-CM

## 2015-08-18 DIAGNOSIS — R739 Hyperglycemia, unspecified: Secondary | ICD-10-CM

## 2015-08-18 DIAGNOSIS — I10 Essential (primary) hypertension: Secondary | ICD-10-CM | POA: Diagnosis not present

## 2015-08-18 LAB — COMPLETE METABOLIC PANEL WITH GFR
ALBUMIN: 3.9 g/dL (ref 3.6–5.1)
ALK PHOS: 77 U/L (ref 40–115)
ALT: 48 U/L — ABNORMAL HIGH (ref 9–46)
AST: 33 U/L (ref 10–35)
BILIRUBIN TOTAL: 0.7 mg/dL (ref 0.2–1.2)
BUN: 21 mg/dL (ref 7–25)
CALCIUM: 8.9 mg/dL (ref 8.6–10.3)
CO2: 30 mmol/L (ref 20–31)
Chloride: 99 mmol/L (ref 98–110)
Creat: 1.05 mg/dL (ref 0.70–1.25)
GFR, EST NON AFRICAN AMERICAN: 74 mL/min (ref >=60)
GFR, Est African American: 85 mL/min (ref >=60)
GLUCOSE: 163 mg/dL — AB (ref 65–99)
POTASSIUM: 4.3 mmol/L (ref 3.5–5.3)
SODIUM: 137 mmol/L (ref 135–146)
TOTAL PROTEIN: 6.9 g/dL (ref 6.1–8.1)

## 2015-08-18 LAB — POCT UA - MICROSCOPIC ONLY
Bacteria, U Microscopic: NEGATIVE
CASTS, UR, LPF, POC: NEGATIVE
Crystals, Ur, HPF, POC: NEGATIVE
MUCUS UA: NEGATIVE
WBC, UR, HPF, POC: NEGATIVE
YEAST UA: NEGATIVE

## 2015-08-18 LAB — POCT CBC
GRANULOCYTE PERCENT: 51 % (ref 37–80)
HEMATOCRIT: 43.8 % (ref 43.5–53.7)
Hemoglobin: 14.5 g/dL (ref 14.1–18.1)
Lymph, poc: 2.2 (ref 0.6–3.4)
MCH: 29.5 pg (ref 27–31.2)
MCHC: 33 g/dL (ref 31.8–35.4)
MCV: 89.3 fL (ref 80–97)
MID (CBC): 0.7 (ref 0–0.9)
MPV: 6 fL (ref 0–99.8)
POC Granulocyte: 3 (ref 2–6.9)
POC LYMPH %: 37.7 % (ref 10–50)
POC MID %: 11.3 % (ref 0–12)
Platelet Count, POC: 326 10*3/uL (ref 142–424)
RBC: 4.9 M/uL (ref 4.69–6.13)
RDW, POC: 13.4 %
WBC: 5.9 10*3/uL (ref 4.6–10.2)

## 2015-08-18 LAB — POCT URINALYSIS DIPSTICK
BILIRUBIN UA: NEGATIVE
Glucose, UA: NEGATIVE
KETONES UA: NEGATIVE
Leukocytes, UA: NEGATIVE
Nitrite, UA: NEGATIVE
PH UA: 6
PROTEIN UA: NEGATIVE
SPEC GRAV UA: 1.015
Urobilinogen, UA: 0.2

## 2015-08-18 LAB — POCT SEDIMENTATION RATE: POCT SED RATE: 17 mm/hr (ref 0–22)

## 2015-08-18 NOTE — Telephone Encounter (Signed)
Call patient and let him know I am here today and tomorrow and we can certainly do those tests.

## 2015-08-18 NOTE — Progress Notes (Addendum)
Patient ID: [name], male   DOB: [birth year], 69 y.o.   MRN: [id number]    This chart was scribed for [name], MD by Ladene Artist, ED Scribe. The patient was seen in room 13. Patient's care was started at 8:19 AM  Chief Complaint:  Chief Complaint  Patient presents with  . Follow-up    recheck from 8/30 visit to discuss results and abdominal pain & fatigue    HPI: [name] is a 69 y.o. male who reports to [facility] today for a follow-up appointment to discuss abdominal pain and fatigue. Pt was seen in the office 2 days ago with fever and abdominal pain. He was sent to the ED for an abdominal/pelvis CT which was normal. He also called the office to express his concern for tick exposure. He reports that he removed 2-3 ticks 6-8 weeks ago that he obtained while doing yard work. Pt denies photophobia, HA, rash, emesis, difficulty urinating, dysuria, chills. Overall, pt reports persistent mild, unchanged abdominal pain that is exacerbated with palpation and mild fatigue. He further reports a subjective fever last night and random hot flashes during the day. No sick contacts with similar symptoms.     Past Surgical History  Procedure Laterality Date  . Appendectomy    . Tonsillectomy    . Colonoscopy    . Cholecystectomy    . Stress myoview  12/13/2008    EF 61% LV  normal   Social History   Social History  . Marital Status: Married    Spouse Name: N/A  . Number of Children: N/A  . Years of Education: N/A   Social History Main Topics  . Smoking status: Former Smoker    Types: Cigarettes, Pipe    Quit date: 10/05/1996  . Smokeless tobacco: Former Systems developer    Types: Chew     Comment: Occasionally  . Alcohol Use: No  . Drug Use: No  . Sexual Activity: Yes    Birth Control/ Protection: None   Other Topics Concern  . None   Social History Narrative   Family History  Problem Relation Age of Onset  . Pancreatic cancer Mother   . Hypertension Mother   . Cancer  Mother   . Hypertension Sister   . Asthma Son   . Stroke Father   . Hypertension Father   . Hypertension Brother   . Hypertension Daughter   . Cancer Maternal Grandfather    Allergies  Allergen Reactions  . Ibuprofen Swelling  . Lisinopril Swelling    Swelling of face and lips   . Oxaprozin Swelling    Swelling of face and lips  . Ziac [Bisoprolol-Hydrochlorothiazide] Swelling    Per patient records from Dr. Florina Ou.  Thayer Jew Hcl] Itching  . Hctz [Hydrochlorothiazide]   . Norvasc [Amlodipine Besylate]    Prior to Admission medications   Medication Sig Start Date End Date Taking? Authorizing Provider  acetaminophen (TYLENOL) 500 MG tablet Take 1,000 mg by mouth every 4 (four) hours as needed for mild pain.   Yes Historical Provider, MD  cetirizine (ZYRTEC) 10 MG tablet Take 10 mg by mouth daily.   Yes Historical Provider, MD  chlorthalidone (HYGROTON) 25 MG tablet TAKE 1 TABLET BY MOUTH EVERY DAY 04/18/15  Yes Pixie Casino, MD  diltiazem (CARDIZEM CD) 300 MG 24 hr capsule Take 1 capsule (300 mg total) by mouth daily. 02/16/15  Yes Pixie Casino, MD  EPINEPHrine (EPI-PEN) 0.3 mg/0.3 mL DEVI  Inject 0.3 mLs (0.3 mg total) into the muscle once. 10/19/12  Yes Eleanore Kurtis Bushman, PA-C  Menthol-Camphor (TIGER BALM ARTHRITIS RUB EX) Apply 1 application topically 2 (two) times daily as needed (aching).   Yes Historical Provider, MD  metoprolol succinate (TOPROL-XL) 100 MG 24 hr tablet TAKE 1 TABLET BY MOUTH EVERY DAY WITH OR IMMEDIATELY FOLLOWING A MEAL Patient taking differently: TAKE 1/2 TABLET BY MOUTH EVERY DAY WITH OR IMMEDIATELY FOLLOWING A MEAL 04/30/15  Yes Pixie Casino, MD  metFORMIN (GLUCOPHAGE) 500 MG tablet Take 1 tablet (500 mg total) by mouth 1 day or 1 dose. Take one pill per day for one week, then increase to two pills per day. Patient not taking: Reported on 08/14/2015 03/22/15   [name], MD     ROS: The patient denies chills, unintentional weight  loss, headache, photophobia, chest pain, palpitations, wheezing, dyspnea on exertion, nausea, vomiting, dysuria, difficulty urinating, hematuria, melena, numbness, weakness, or tingling. + abdominal pain, + fever, + fatigue, + hot flashes  All other systems have been reviewed and were otherwise negative with the exception of those mentioned in the HPI and as above.    PHYSICAL EXAM: Filed Vitals:   08/18/15 0816  BP: 136/60  Pulse: 79  Temp: 98.3 F (36.8 C)  Resp: 18   Body mass index is 34.48 kg/(m^2).   General: Alert, no acute distress. Not ill appearing.  HEENT:  Normocephalic, atraumatic, oropharynx patent. Cerumen noted in both TMs. Eye: Juliette Mangle [facility] Cardiovascular:  Regular rate and rhythm, no rubs murmurs or gallops. No Carotid bruits, radial pulse intact. No pedal edema.  Respiratory: Clear to auscultation bilaterally.  No wheezes, rales, or rhonchi.  No cyanosis, no use of accessory musculature Abdominal: No organomegaly, abdomen is soft, positive bowel sounds. No masses. Minimal tenderness in deep LLQ.  Musculoskeletal: Gait intact. No edema, tenderness Skin: No rashes. Diaphoretic.  Neurologic: Facial musculature symmetric. Psychiatric: Patient acts appropriately throughout our interaction. Lymphatic: No cervical or submandibular lymphadenopathy Genitourinary/Anorectal: No acute findings  LABS: Results for orders placed or performed in visit on 08/18/15  POCT CBC  Result Value Ref Range   WBC 5.9 4.6 - 10.2 K/uL   Lymph, poc 2.2 0.6 - 3.4   POC LYMPH PERCENT 37.7 10 - 50 %L   MID (cbc) 0.7 0 - 0.9   POC MID % 11.3 0 - 12 %M   POC Granulocyte 3.0 2 - 6.9   Granulocyte percent 51.0 37 - 80 %G   RBC 4.90 4.69 - 6.13 M/uL   Hemoglobin 14.5 14.1 - 18.1 g/dL   HCT, POC 43.8 43.5 - 53.7 %   MCV 89.3 80 - 97 fL   MCH, POC 29.5 27 - 31.2 pg   MCHC 33.0 31.8 - 35.4 g/dL   RDW, POC 13.4 %   Platelet Count, POC 326 142 - 424 K/uL   MPV 6.0 0 - 99.8 fL  POCT UA -  Microscopic Only  Result Value Ref Range   WBC, Ur, HPF, POC neg    RBC, urine, microscopic 0-1    Bacteria, U Microscopic neg    Mucus, UA neg    Epithelial cells, urine per micros 0-1    Crystals, Ur, HPF, POC neg    Casts, Ur, LPF, POC neg    Yeast, UA neg   POCT urinalysis dipstick  Result Value Ref Range   Color, UA yellow    Clarity, UA clear    Glucose, UA neg  Bilirubin, UA neg    Ketones, UA neg    Spec Grav, UA 1.015    Blood, UA trace    pH, UA 6.0    Protein, UA neg    Urobilinogen, UA 0.2    Nitrite, UA neg    Leukocytes, UA Negative Negative     EKG/XRAY:   Primary read interpreted by Dr. Everlene Farrier at [facility].   ASSESSMENT/PLAN: No clear source of his fever. All things considered there  is no definite evidence of an infectious origin. I did order titers for Lyme disease and spotted fever. His symptoms are not really consistent with that. Will await these results including a sedimentation rate. This could be something else such as drug fever.  Gross sideeffects, risk and benefits, and alternatives of medications d/w patient. Patient is aware that all medications have potential sideeffects and we are unable to predict every sideeffect or drug-drug interaction that may occur.  [name] MD 08/18/2015 8:49 AM

## 2015-08-20 LAB — HEPATITIS B SURFACE ANTIGEN: Hepatitis B Surface Ag: NEGATIVE

## 2015-08-20 LAB — HEPATITIS C ANTIBODY: HCV Ab: NEGATIVE

## 2015-08-20 LAB — PSA, MEDICARE: PSA: 0.91 ng/mL (ref <=4.00)

## 2015-08-20 NOTE — Telephone Encounter (Signed)
Pt seen yesterday.

## 2015-08-21 ENCOUNTER — Telehealth: Payer: Self-pay | Admitting: Emergency Medicine

## 2015-08-21 ENCOUNTER — Other Ambulatory Visit: Payer: Self-pay | Admitting: Internal Medicine

## 2015-08-21 ENCOUNTER — Ambulatory Visit (INDEPENDENT_AMBULATORY_CARE_PROVIDER_SITE_OTHER): Payer: Medicare HMO | Admitting: Internal Medicine

## 2015-08-21 VITALS — BP 138/70 | HR 102 | Temp 99.7°F | Resp 16 | Ht 73.0 in | Wt 261.0 lb

## 2015-08-21 DIAGNOSIS — R5383 Other fatigue: Secondary | ICD-10-CM

## 2015-08-21 DIAGNOSIS — R102 Pelvic and perineal pain: Secondary | ICD-10-CM

## 2015-08-21 DIAGNOSIS — R945 Abnormal results of liver function studies: Secondary | ICD-10-CM

## 2015-08-21 DIAGNOSIS — R7989 Other specified abnormal findings of blood chemistry: Secondary | ICD-10-CM | POA: Diagnosis not present

## 2015-08-21 DIAGNOSIS — R509 Fever, unspecified: Secondary | ICD-10-CM | POA: Diagnosis not present

## 2015-08-21 LAB — POCT URINALYSIS DIPSTICK
Bilirubin, UA: NEGATIVE
Glucose, UA: NEGATIVE
KETONES UA: NEGATIVE
Leukocytes, UA: NEGATIVE
Nitrite, UA: NEGATIVE
PH UA: 6
PROTEIN UA: NEGATIVE
SPEC GRAV UA: 1.01
Urobilinogen, UA: 0.2

## 2015-08-21 LAB — B. BURGDORFI ANTIBODIES: B burgdorferi Ab IgG+IgM: 0.14 {ISR}

## 2015-08-21 LAB — POCT UA - MICROSCOPIC ONLY
Bacteria, U Microscopic: NEGATIVE
CASTS, UR, LPF, POC: NEGATIVE
CRYSTALS, UR, HPF, POC: NEGATIVE
EPITHELIAL CELLS, URINE PER MICROSCOPY: NEGATIVE
Mucus, UA: NEGATIVE
WBC, Ur, HPF, POC: NEGATIVE
YEAST UA: NEGATIVE

## 2015-08-21 LAB — POCT CBC
GRANULOCYTE PERCENT: 48.3 % (ref 37–80)
HEMATOCRIT: 45.5 % (ref 43.5–53.7)
HEMOGLOBIN: 14.9 g/dL (ref 14.1–18.1)
Lymph, poc: 2.7 (ref 0.6–3.4)
MCH: 29.2 pg (ref 27–31.2)
MCHC: 32.6 g/dL (ref 31.8–35.4)
MCV: 89.3 fL (ref 80–97)
MID (cbc): 0.8 (ref 0–0.9)
MPV: 5.7 fL (ref 0–99.8)
POC GRANULOCYTE: 3.2 (ref 2–6.9)
POC LYMPH PERCENT: 40.2 %L (ref 10–50)
POC MID %: 11.5 % (ref 0–12)
Platelet Count, POC: 316 10*3/uL (ref 142–424)
RBC: 5.09 M/uL (ref 4.69–6.13)
RDW, POC: 13.7 %
WBC: 6.6 10*3/uL (ref 4.6–10.2)

## 2015-08-21 LAB — EBV ANTIBODIES, CHRONIC PROFILE
EBV EA IGG: 65.1 U/mL — AB (ref <9.0)
EBV NA IgG: 135 U/mL — ABNORMAL HIGH (ref <18.0)
EBV VCA IGG: 240 U/mL — AB (ref <18.0)

## 2015-08-21 LAB — POCT SEDIMENTATION RATE: POCT SED RATE: 21 mm/hr (ref 0–22)

## 2015-08-21 LAB — ROCKY MTN SPOTTED FVR AB, IGM-BLOOD: ROCKY MTN SPOTTED FEVER, IGM: 0.13 IV

## 2015-08-21 LAB — IFOBT (OCCULT BLOOD): IFOBT: NEGATIVE

## 2015-08-21 LAB — CMV IGM: CMV IgM: 47.4 AU/mL — ABNORMAL HIGH (ref <30.00)

## 2015-08-21 NOTE — Telephone Encounter (Signed)
Patient wants Dr. Everlene Farrier to know he has a fever of 101.5.  [phone]

## 2015-08-21 NOTE — Telephone Encounter (Signed)
I called and spoke with patient at 4:30. He is going to come in and see Dr. Laney Pastor for reevaluation of his abdominal pain. I spoke with Dr. Laney Pastor and he is aware of the patient coming in.

## 2015-08-21 NOTE — Telephone Encounter (Signed)
Spoke with pt, he states he is having abdominal pain with this fever. He wants to know how long he should wait to come in. I advised him to come in now. Pt wanted me to relay message first.

## 2015-08-21 NOTE — Telephone Encounter (Signed)
I called and spoke with patient he is coming in to see Dr. Laney Pastor.

## 2015-08-21 NOTE — Progress Notes (Signed)
Subjective:    Patient ID: [name], male    DOB: May 07, 1946, 69 y.o.   MRN: [id number] This chart was scribed for Tami Lin, MD by Marti Sleigh, Medical Scribe. This patient was seen in Room 4 and the patient's care was started a 5:50 PM.  Chief Complaint  Patient presents with  . Fever    x 4 days  . Abdominal Pain    x 4 days    HPI HPI Comments: [name] is a 69 y.o. male who presents to [facility] complaining of continued intermittent fever (101.5) and abdominal pain for the last ten days. He was seen at [facility] five days ago for the same sx, and was referred for a CAT scan of his abdomen to check for suspected diverticulitis. The CAT scan was normal. He does not feel that the pain worsens when he is sitting down. He endorses associated nausea and reduced appetite for the last few days. He had intermittent diarrhea and then constipation early in the course of his sx, but none recently. He denies dysuria, urinary frequency or urgency, cough, pain with deep breathing or pain radiating to his back. His most distressing symptom is that he feels totally fatigue and began to sweat with the slightest activity.   Patient Active Problem List   Diagnosis Date Noted  . Low HDL (under 40) 07/12/2013  . Angioedema 11/08/2012  . Atrial flutter with rapid ventricular response--- now stable without a recurrence  02/04/2012  . Essential hypertension 06/15/2009  . BRONCHOSPASM 06/14/2009    -  Hyperglycemia-abnormal A1c diagnosis spring 2016 but he elected to use weight loss rather than starting medicines and has lost 15 pounds so far.    Review of Systems  Constitutional: Positive for fever and appetite change. Negative for chills.  Respiratory: Negative for cough and shortness of breath.   Cardiovascular: Negative for chest pain.  Gastrointestinal: Positive for nausea and abdominal pain. Negative for diarrhea and constipation.      Objective:   Physical Exam    Constitutional: He is oriented to person, place, and time. He appears well-developed and well-nourished. No distress.  HENT:  Head: Normocephalic and atraumatic.  Mouth/Throat: Oropharynx is clear and moist.  Eyes: Conjunctivae and EOM are normal. Pupils are equal, round, and reactive to light.  Neck: Neck supple. No thyromegaly present.  Cardiovascular: Normal rate, regular rhythm, normal heart sounds and intact distal pulses.   No murmur heard. Pulmonary/Chest: Effort normal and breath sounds normal. No respiratory distress.  Abdominal: Soft. Bowel sounds are normal. There is tenderness.  Suprapubic tenderness with positive precussion pain. Negative rebound. No masses or organomegaly and no tenderness of the liver.  Genitourinary: Guaiac negative stool.  No rectal masses or tenderness. Prostate is small, symmetrical and non tender.   Musculoskeletal: Normal range of motion. He exhibits no edema.  Lymphadenopathy:    He has no cervical adenopathy.  Neurological: He is alert and oriented to person, place, and time. No cranial nerve deficit. Coordination normal.  Skin: Skin is warm and dry. No rash noted. He is not diaphoretic.  Psychiatric: He has a normal mood and affect. His behavior is normal.  Nursing note and vitals reviewed.   Filed Vitals:   08/21/15 1732  BP: 138/70  Pulse: 102  Temp: 99.7 F (37.6 C)  TempSrc: Oral  Resp: 16  Height: 6\' 1"  (1.854 m)  Weight: 261 lb (118.389 kg)  SpO2: 98%   Results for orders placed or performed  in visit on 08/21/15  POCT CBC  Result Value Ref Range   WBC 6.6 4.6 - 10.2 K/uL   Lymph, poc 2.7 0.6 - 3.4   POC LYMPH PERCENT 40.2 10 - 50 %L   MID (cbc) 0.8 0 - 0.9   POC MID % 11.5 0 - 12 %M   POC Granulocyte 3.2 2 - 6.9   Granulocyte percent 48.3 37 - 80 %G   RBC 5.09 4.69 - 6.13 M/uL   Hemoglobin 14.9 14.1 - 18.1 g/dL   HCT, POC 45.5 43.5 - 53.7 %   MCV 89.3 80 - 97 fL   MCH, POC 29.2 27 - 31.2 pg   MCHC 32.6 31.8 - 35.4 g/dL    RDW, POC 13.7 %   Platelet Count, POC 316 142 - 424 K/uL   MPV 5.7 0 - 99.8 fL  IFOBT POC (occult bld, rslt in office)  Result Value Ref Range   IFOBT Negative   POCT SEDIMENTATION RATE  Result Value Ref Range   POCT SED RATE 21 0 - 22 mm/hr  POCT UA - Microscopic Only  Result Value Ref Range   WBC, Ur, HPF, POC neg    RBC, urine, microscopic 0-1    Bacteria, U Microscopic neg    Mucus, UA neg    Epithelial cells, urine per micros neg    Crystals, Ur, HPF, POC neg    Casts, Ur, LPF, POC neg    Yeast, UA neg   POCT urinalysis dipstick  Result Value Ref Range   Color, UA yellow    Clarity, UA clear    Glucose, UA neg    Bilirubin, UA neg    Ketones, UA neg    Spec Grav, UA 1.010    Blood, UA small    pH, UA 6.0    Protein, UA neg    Urobilinogen, UA 0.2    Nitrite, UA neg    Leukocytes, UA Negative Negative        Assessment & Plan:  Pelvic pain in male -this is not his most prominent symptom in his estimation/still there is no clear etiology  for this/further scanning not indicated at this point Fever, unspecified fever cause  Other fatigue - Abnormal AST-minimal elevation  Diagnosis continues to be elusive but he is stable and does not have an emerging granulocytosis In retrospect he remembers that a nephew has a diagnosis of mono about 2 months ago so we will test for this and repeat his liver studies. He has a history of hepatitis in college that resolved with rest   Plan: Epstein-Barr virus VCA antibody panel, Acute Hep Panel & Hep B Surface Ab///CMET  We will call him about follow-up   I have completed the patient encounter in its entirety as documented by the scribe, with editing by me where necessary. Ngai Parcell P. Laney Pastor, M.D.

## 2015-08-22 ENCOUNTER — Ambulatory Visit: Payer: Medicare HMO | Admitting: Physician Assistant

## 2015-08-22 LAB — COMPREHENSIVE METABOLIC PANEL
ALT: 50 U/L — ABNORMAL HIGH (ref 9–46)
AST: 33 U/L (ref 10–35)
Albumin: 4 g/dL (ref 3.6–5.1)
Alkaline Phosphatase: 69 U/L (ref 40–115)
BUN: 18 mg/dL (ref 7–25)
CHLORIDE: 96 mmol/L — AB (ref 98–110)
CO2: 32 mmol/L — AB (ref 20–31)
Calcium: 9.1 mg/dL (ref 8.6–10.3)
Creat: 1.07 mg/dL (ref 0.70–1.25)
GLUCOSE: 96 mg/dL (ref 65–99)
POTASSIUM: 4.1 mmol/L (ref 3.5–5.3)
Sodium: 136 mmol/L (ref 135–146)
Total Bilirubin: 0.8 mg/dL (ref 0.2–1.2)
Total Protein: 7.3 g/dL (ref 6.1–8.1)

## 2015-08-23 ENCOUNTER — Telehealth: Payer: Self-pay | Admitting: Emergency Medicine

## 2015-08-23 NOTE — Telephone Encounter (Signed)
I called and spoke with patient. He will follow-up with me tomorrow.

## 2015-08-23 NOTE — Telephone Encounter (Signed)
Patient called to let Dr. Everlene Farrier know that his temperature starts at 100 degrees and progresses to a little over 102 degrees. He would also like to receive a call from Dr. Everlene Farrier to go over lab results.

## 2015-08-24 ENCOUNTER — Ambulatory Visit (INDEPENDENT_AMBULATORY_CARE_PROVIDER_SITE_OTHER): Payer: Medicare HMO | Admitting: Emergency Medicine

## 2015-08-24 ENCOUNTER — Telehealth: Payer: Self-pay

## 2015-08-24 ENCOUNTER — Other Ambulatory Visit: Payer: Self-pay | Admitting: Internal Medicine

## 2015-08-24 ENCOUNTER — Other Ambulatory Visit: Payer: Self-pay | Admitting: Emergency Medicine

## 2015-08-24 VITALS — BP 122/70 | HR 90 | Temp 99.8°F | Resp 16 | Ht 73.0 in | Wt 260.0 lb

## 2015-08-24 DIAGNOSIS — B259 Cytomegaloviral disease, unspecified: Secondary | ICD-10-CM | POA: Diagnosis not present

## 2015-08-24 DIAGNOSIS — R5383 Other fatigue: Secondary | ICD-10-CM | POA: Diagnosis not present

## 2015-08-24 DIAGNOSIS — R7989 Other specified abnormal findings of blood chemistry: Secondary | ICD-10-CM | POA: Diagnosis not present

## 2015-08-24 DIAGNOSIS — R509 Fever, unspecified: Secondary | ICD-10-CM

## 2015-08-24 DIAGNOSIS — R0609 Other forms of dyspnea: Secondary | ICD-10-CM

## 2015-08-24 DIAGNOSIS — R945 Abnormal results of liver function studies: Secondary | ICD-10-CM

## 2015-08-24 DIAGNOSIS — R0600 Dyspnea, unspecified: Secondary | ICD-10-CM

## 2015-08-24 LAB — HIV ANTIBODY (ROUTINE TESTING W REFLEX): HIV 1&2 Ab, 4th Generation: NONREACTIVE

## 2015-08-24 NOTE — Telephone Encounter (Signed)
Per Dr. Everlene Farrier, check on status of labs. Not resulted in lab.  Labs all done Hard copy printed. IT is working on getting them resulted in [employer] today and added on HIV

## 2015-08-24 NOTE — Progress Notes (Addendum)
Patient ID: EDDRICK [name], male   DOB: 1946/09/27, 69 y.o.   MRN: [id number]    This chart was scribed for Jared Russian, MD by Ladene Artist, ED Scribe. The patient was seen in room 3. Patient's care was started at 11:46 AM.   Chief Complaint:  Chief Complaint  Patient presents with  . Abdominal Pain  . Fever    HPI: [name] is a 69 y.o. male, with a h/o HTN, who reports to [facility] today complaining of intermittent fever for the past week. Pt reports readings ranging from 67-102 F for the past few days. He reports 34 F yesterday afternoon and 99.8 F this morning. Pt also reports associated diaphoresis, fatigue, mild HA and low abdominal pain. He reports persistent, non-radiating, dull, aching low abdominal pain. He has tried Tylenol which he recently stopped since it did not provide significant relief. He has also tried consuming more fluids. Pt reports that he was sent to the ED directly from [facility] approximately 6-7 years ago but no diagnosis was made.   Past Medical History  Diagnosis Date  . Hypertension   . Anxiety   . Pneumonia   . RAD (reactive airway disease)   . Allergic angioedema   . Atrial flutter    Past Surgical History  Procedure Laterality Date  . Appendectomy    . Tonsillectomy    . Colonoscopy    . Cholecystectomy    . Stress myoview  12/13/2008    EF 61% LV  normal   Social History   Social History  . Marital Status: Married    Spouse Name: N/A  . Number of Children: N/A  . Years of Education: N/A   Social History Main Topics  . Smoking status: Former Smoker    Types: Cigarettes, Pipe    Quit date: 10/05/1996  . Smokeless tobacco: Former Systems developer    Types: Chew     Comment: Occasionally  . Alcohol Use: No  . Drug Use: No  . Sexual Activity: Yes    Birth Control/ Protection: None   Other Topics Concern  . None   Social History Narrative   Family History  Problem Relation Age of Onset  . Pancreatic cancer Mother   . Hypertension  Mother   . Cancer Mother   . Hypertension Sister   . Asthma Son   . Stroke Father   . Hypertension Father   . Hypertension Brother   . Hypertension Daughter   . Cancer Maternal Grandfather    Allergies  Allergen Reactions  . Ibuprofen Swelling  . Lisinopril Swelling    Swelling of face and lips   . Oxaprozin Swelling    Swelling of face and lips  . Ziac [Bisoprolol-Hydrochlorothiazide] Swelling    Per patient records from Dr. Florina Ou.  Thayer Jew Hcl] Itching  . Hctz [Hydrochlorothiazide]   . Norvasc [Amlodipine Besylate]    Prior to Admission medications   Medication Sig Start Date End Date Taking? Authorizing Provider  cetirizine (ZYRTEC) 10 MG tablet Take 10 mg by mouth daily.   Yes Historical Provider, MD  chlorthalidone (HYGROTON) 25 MG tablet TAKE 1 TABLET BY MOUTH EVERY DAY 04/18/15  Yes Pixie Casino, MD  diltiazem (CARDIZEM CD) 300 MG 24 hr capsule Take 1 capsule (300 mg total) by mouth daily. 02/16/15  Yes Pixie Casino, MD  EPINEPHrine (EPI-PEN) 0.3 mg/0.3 mL DEVI Inject 0.3 mLs (0.3 mg total) into the muscle once. 10/19/12  Yes Eleanore E  Elana Alm, PA-C  Menthol-Camphor (TIGER BALM ARTHRITIS RUB EX) Apply 1 application topically 2 (two) times daily as needed (aching).   Yes Historical Provider, MD  metoprolol succinate (TOPROL-XL) 100 MG 24 hr tablet TAKE 1 TABLET BY MOUTH EVERY DAY WITH OR IMMEDIATELY FOLLOWING A MEAL Patient taking differently: TAKE 1/2 TABLET BY MOUTH EVERY DAY WITH OR IMMEDIATELY FOLLOWING A MEAL 04/30/15  Yes Pixie Casino, MD     ROS: The patient denies chills, night sweats, unintentional weight loss, chest pain, palpitations, wheezing, dyspnea on exertion, nausea, vomiting, dysuria, hematuria, melena, numbness, weakness, or tingling. + fever, + diaphoresis, + HA, + abdominal pain  All other systems have been reviewed and were otherwise negative with the exception of those mentioned in the HPI and as above.    PHYSICAL EXAM: Filed  Vitals:   08/24/15 1128  BP: 122/70  Pulse: 90  Temp: 99.8 F (37.7 C)  Resp: 16   Body mass index is 34.31 kg/(m^2).   General: Alert, no acute distress HEENT:  Normocephalic, atraumatic, oropharynx patent. Eye: [facility]te M[facility] Cardiovascular:  Regular rate and rhythm, no rubs murmurs or gallops.  No Carotid bruits, radial pulse intact. No pedal edema.  Respiratory: Clear to auscultation bilaterally.  No wheezes, rales, or rhonchi.  No cyanosis, no use of accessory musculature Abdominal: No organomegaly, abdomen is soft, positive bowel sounds. No masses. Very minimal suprapubic tenderness.  Musculoskeletal: Gait intact. No edema, tenderness Skin: No rashes. Neurologic: Facial musculature symmetric. Psychiatric: Patient acts appropriately throughout our interaction. Lymphatic: No cervical or submandibular lymphadenopathy Genitourinary/Anorectal: No acute findings  LABS: Results for orders placed or performed in visit on 08/21/15  Comprehensive metabolic panel  Result Value Ref Range   Sodium 136 135 - 146 mmol/L   Potassium 4.1 3.5 - 5.3 mmol/L   Chloride 96 (L) 98 - 110 mmol/L   CO2 32 (H) 20 - 31 mmol/L   Glucose, Bld 96 65 - 99 mg/dL   BUN 18 7 - 25 mg/dL   Creat 1.07 0.70 - 1.25 mg/dL   Total Bilirubin 0.8 0.2 - 1.2 mg/dL   Alkaline Phosphatase 69 40 - 115 U/L   AST 33 10 - 35 U/L   ALT 50 (H) 9 - 46 U/L   Total Protein 7.3 6.1 - 8.1 g/dL   Albumin 4.0 3.6 - 5.1 g/dL   Calcium 9.1 8.6 - 10.3 mg/dL  POCT CBC  Result Value Ref Range   WBC 6.6 4.6 - 10.2 K/uL   Lymph, poc 2.7 0.6 - 3.4   POC LYMPH PERCENT 40.2 10 - 50 %L   MID (cbc) 0.8 0 - 0.9   POC MID % 11.5 0 - 12 %M   POC Granulocyte 3.2 2 - 6.9   Granulocyte percent 48.3 37 - 80 %G   RBC 5.09 4.69 - 6.13 M/uL   Hemoglobin 14.9 14.1 - 18.1 g/dL   HCT, POC 45.5 43.5 - 53.7 %   MCV 89.3 80 - 97 fL   MCH, POC 29.2 27 - 31.2 pg   MCHC 32.6 31.8 - 35.4 g/dL   RDW, POC 13.7 %   Platelet Count, POC 316 142 - 424  K/uL   MPV 5.7 0 - 99.8 fL  IFOBT POC (occult bld, rslt in office)  Result Value Ref Range   IFOBT Negative   POCT SEDIMENTATION RATE  Result Value Ref Range   POCT SED RATE 21 0 - 22 mm/hr  POCT UA - Microscopic Only  Result  Value Ref Range   WBC, Ur, HPF, POC neg    RBC, urine, microscopic 0-1    Bacteria, U Microscopic neg    Mucus, UA neg    Epithelial cells, urine per micros neg    Crystals, Ur, HPF, POC neg    Casts, Ur, LPF, POC neg    Yeast, UA neg   POCT urinalysis dipstick  Result Value Ref Range   Color, UA yellow    Clarity, UA clear    Glucose, UA neg    Bilirubin, UA neg    Ketones, UA neg    Spec Grav, UA 1.010    Blood, UA small    pH, UA 6.0    Protein, UA neg    Urobilinogen, UA 0.2    Nitrite, UA neg    Leukocytes, UA Negative Negative    EKG/XRAY:   Primary read interpreted by Dr. Everlene Farrier at [facility].   ASSESSMENT/PLAN: Blood  work shows mild elevation in LFT's CMV IgM elevated. His chronic titers for EBV are positive. I will check and be sure his hepatitis tests have been done. Tests for Lyme and spotted fever are normal. He will keep a fever log for now. I called Dr. Debara Pickett. He was agreeable to schedule an echocardiogram of the patient to rule out SBE.   Gross sideeffects, risk and benefits, and alternatives of medications d/w patient. Patient is aware that all medications have potential sideeffects and we are unable to predict every sideeffect or drug-drug interaction that may occur.  [name] MD 08/24/2015 11:46 AM

## 2015-08-24 NOTE — Patient Instructions (Signed)
CMV, Cytomegalovirus This test is done if your caregiver suspects you presently have or have had a CMV (cytomegalovirus) infection. It is also done if you are in a clinical situation where it is important to know your CMV status, such as before surgery or organ transplant. It is sometimes done when a young adult, a pregnant male, or an immune-compromised patient has flu-like symptoms that suggest a CMV infection; or if a newborn has multiple congenital anomalies, unexplained jaundice or anemia, and/or if an infant has seizures or developmental problems that may be due to CMV. Testing involves either a measurement of CMV antibodies (specific proteins created by the body's immune system in response to the virus) or the detection of the virus itself, through culturing and growing the virus or by amplifying the genetic material from the virus itself. SAMPLE REQUIRED A blood sample is drawn from a vein in your arm for CMV antibody testing. To detect the virus itself, the sample may be blood, urine, or sputum, amniotic fluid, cerebrospinal fluid, duodenal (from the intestines) fluid, or other body tissue. NORMAL FINDINGS Negative growth, No virus located Ranges for normal findings may vary among different laboratories and hospitals. You should always check with your doctor after having lab work or other tests done to discuss the meaning of your test results and whether your values are considered within normal limits. MEANING OF TEST  Your caregiver will go over the test results with you and discuss the importance and meaning of your results, as well as treatment options and the need for additional tests if necessary. OBTAINING THE TEST RESULTS It is your responsibility to obtain your test results. Ask the lab or department performing the test when and how you will get your results. Document Released: 01/01/2005 Document Revised: 02/23/2012 Document Reviewed: 11/08/2008 [facility] Patient Information 2015  [place], [place]. This information is not intended to replace advice given to you by your health care provider. Make sure you discuss any questions you have with your health care provider.

## 2015-08-25 ENCOUNTER — Other Ambulatory Visit: Payer: Self-pay | Admitting: Emergency Medicine

## 2015-08-25 DIAGNOSIS — R894 Abnormal immunological findings in specimens from other organs, systems and tissues: Secondary | ICD-10-CM

## 2015-08-25 DIAGNOSIS — R768 Other specified abnormal immunological findings in serum: Secondary | ICD-10-CM

## 2015-08-25 LAB — HEPATITIS PANEL, ACUTE
HCV AB: NEGATIVE
HEP A IGM: NONREACTIVE
HEP B S AG: NEGATIVE
Hep B C IgM: NONREACTIVE

## 2015-08-27 ENCOUNTER — Encounter (HOSPITAL_COMMUNITY): Payer: Self-pay | Admitting: Emergency Medicine

## 2015-08-27 ENCOUNTER — Emergency Department (HOSPITAL_COMMUNITY): Payer: Medicare HMO

## 2015-08-27 ENCOUNTER — Emergency Department (HOSPITAL_COMMUNITY)
Admission: EM | Admit: 2015-08-27 | Discharge: 2015-08-28 | Disposition: A | Discharge status: Home / Self Care | Payer: Medicare HMO | Attending: Emergency Medicine | Admitting: Emergency Medicine

## 2015-08-27 ENCOUNTER — Telehealth: Payer: Self-pay | Admitting: Emergency Medicine

## 2015-08-27 ENCOUNTER — Ambulatory Visit (INDEPENDENT_AMBULATORY_CARE_PROVIDER_SITE_OTHER): Payer: Medicare HMO | Admitting: Family Medicine

## 2015-08-27 VITALS — BP 132/65 | HR 90 | Temp 98.5°F | Resp 18 | Ht 74.0 in | Wt 264.0 lb

## 2015-08-27 DIAGNOSIS — Z8701 Personal history of pneumonia (recurrent): Secondary | ICD-10-CM | POA: Diagnosis not present

## 2015-08-27 DIAGNOSIS — R1032 Left lower quadrant pain: Secondary | ICD-10-CM | POA: Diagnosis present

## 2015-08-27 DIAGNOSIS — Z792 Long term (current) use of antibiotics: Secondary | ICD-10-CM | POA: Diagnosis not present

## 2015-08-27 DIAGNOSIS — I10 Essential (primary) hypertension: Secondary | ICD-10-CM | POA: Insufficient documentation

## 2015-08-27 DIAGNOSIS — K859 Acute pancreatitis, unspecified: Secondary | ICD-10-CM | POA: Insufficient documentation

## 2015-08-27 DIAGNOSIS — D539 Nutritional anemia, unspecified: Secondary | ICD-10-CM

## 2015-08-27 DIAGNOSIS — Z79899 Other long term (current) drug therapy: Secondary | ICD-10-CM | POA: Insufficient documentation

## 2015-08-27 DIAGNOSIS — J45909 Unspecified asthma, uncomplicated: Secondary | ICD-10-CM | POA: Insufficient documentation

## 2015-08-27 DIAGNOSIS — F419 Anxiety disorder, unspecified: Secondary | ICD-10-CM | POA: Insufficient documentation

## 2015-08-27 DIAGNOSIS — R509 Fever, unspecified: Secondary | ICD-10-CM

## 2015-08-27 DIAGNOSIS — I4892 Unspecified atrial flutter: Secondary | ICD-10-CM | POA: Diagnosis not present

## 2015-08-27 DIAGNOSIS — Z87891 Personal history of nicotine dependence: Secondary | ICD-10-CM | POA: Diagnosis not present

## 2015-08-27 LAB — COMPREHENSIVE METABOLIC PANEL
ALBUMIN: 3.3 g/dL — AB (ref 3.6–5.1)
ALBUMIN: 3.5 g/dL (ref 3.5–5.0)
ALT: 59 U/L — ABNORMAL HIGH (ref 9–46)
ALT: 62 U/L (ref 17–63)
AST: 39 U/L — ABNORMAL HIGH (ref 10–35)
AST: 47 U/L — AB (ref 15–41)
Alkaline Phosphatase: 62 U/L (ref 40–115)
Alkaline Phosphatase: 60 U/L (ref 38–126)
Anion gap: 9 (ref 5–15)
BUN: 17 mg/dL (ref 7–25)
BUN: 18 mg/dL (ref 6–20)
CHLORIDE: 95 mmol/L — AB (ref 98–110)
CHLORIDE: 98 mmol/L — AB (ref 101–111)
CO2: 28 mmol/L (ref 20–31)
CO2: 25 mmol/L (ref 22–32)
CREATININE: 1.11 mg/dL (ref 0.70–1.25)
CREATININE: 1.08 mg/dL (ref 0.61–1.24)
Calcium: 8.5 mg/dL — ABNORMAL LOW (ref 8.6–10.3)
Calcium: 8.2 mg/dL — ABNORMAL LOW (ref 8.9–10.3)
GFR calc Af Amer: >60 mL/min (ref >60)
GLUCOSE: 173 mg/dL — AB (ref 65–99)
Glucose, Bld: 212 mg/dL — ABNORMAL HIGH (ref 65–99)
POTASSIUM: 4.2 mmol/L (ref 3.5–5.3)
POTASSIUM: 3.7 mmol/L (ref 3.5–5.1)
SODIUM: 133 mmol/L — AB (ref 135–146)
SODIUM: 132 mmol/L — AB (ref 135–145)
TOTAL PROTEIN: 6.7 g/dL (ref 6.1–8.1)
Total Bilirubin: 0.6 mg/dL (ref 0.2–1.2)
Total Bilirubin: 1.1 mg/dL (ref 0.3–1.2)
Total Protein: 7.3 g/dL (ref 6.5–8.1)

## 2015-08-27 LAB — URINALYSIS, ROUTINE W REFLEX MICROSCOPIC
Bilirubin Urine: NEGATIVE
GLUCOSE, UA: NEGATIVE mg/dL
Hgb urine dipstick: NEGATIVE
Ketones, ur: NEGATIVE mg/dL
LEUKOCYTES UA: NEGATIVE
Nitrite: NEGATIVE
PROTEIN: NEGATIVE mg/dL
SPECIFIC GRAVITY, URINE: 1.017 (ref 1.005–1.030)
Urobilinogen, UA: 0.2 mg/dL (ref 0.0–1.0)
pH: 6 (ref 5.0–8.0)

## 2015-08-27 LAB — POCT CBC
GRANULOCYTE PERCENT: 49.2 % (ref 37–80)
HEMATOCRIT: 38.8 % — AB (ref 43.5–53.7)
Hemoglobin: 12.9 g/dL — AB (ref 14.1–18.1)
Lymph, poc: 3.4 (ref 0.6–3.4)
MCH: 29.4 pg (ref 27–31.2)
MCHC: 33.3 g/dL (ref 31.8–35.4)
MCV: 88.2 fL (ref 80–97)
MID (CBC): 0.9 (ref 0–0.9)
MPV: 6 fL (ref 0–99.8)
PLATELET COUNT, POC: 285 10*3/uL (ref 142–424)
POC GRANULOCYTE: 4.1 (ref 2–6.9)
POC LYMPH PERCENT: 40.5 %L (ref 10–50)
POC MID %: 10.3 %M (ref 0–12)
RBC: 4.4 M/uL — AB (ref 4.69–6.13)
RDW, POC: 13.2 %
WBC: 8.3 10*3/uL (ref 4.6–10.2)

## 2015-08-27 LAB — CBC
HEMATOCRIT: 37.2 % — AB (ref 39.0–52.0)
Hemoglobin: 13.4 g/dL (ref 13.0–17.0)
MCH: 31.2 pg (ref 26.0–34.0)
MCHC: 36 g/dL (ref 30.0–36.0)
MCV: 86.7 fL (ref 78.0–100.0)
PLATELETS: 232 10*3/uL (ref 150–400)
RBC: 4.29 MIL/uL (ref 4.22–5.81)
RDW: 13.7 % (ref 11.5–15.5)
WBC: 8.2 10*3/uL (ref 4.0–10.5)

## 2015-08-27 LAB — EPSTEIN-BARR VIRUS VCA, IGM

## 2015-08-27 LAB — LIPASE, BLOOD: LIPASE: 74 U/L — AB (ref 22–51)

## 2015-08-27 LAB — IFOBT (OCCULT BLOOD): IMMUNOLOGICAL FECAL OCCULT BLOOD TEST: NEGATIVE

## 2015-08-27 MED ORDER — ACETAMINOPHEN 325 MG PO TABS
650.0000 mg | ORAL_TABLET | Freq: Once | ORAL | Status: AC | PRN
  Administered 2015-08-27: 650 mg via ORAL
  Filled 2015-08-27: qty 2

## 2015-08-27 MED ORDER — ONDANSETRON 4 MG PO TBDP: ORAL_TABLET | ORAL | Status: DC

## 2015-08-27 MED ORDER — IOHEXOL 300 MG/ML  SOLN
100.0000 mL | Freq: Once | INTRAMUSCULAR | Status: AC | PRN
  Administered 2015-08-27: 100 mL via INTRAVENOUS

## 2015-08-27 MED ORDER — AMOXICILLIN-POT CLAVULANATE 875-125 MG PO TABS: 1.0000 | ORAL_TABLET | Freq: Two times a day (BID) | ORAL | Status: DC

## 2015-08-27 NOTE — Telephone Encounter (Signed)
I spoke with patient. He does have a toothache. He has had no afternoon fevers greater than 101. We are proceeding with scheduling an echocardiogram and infectious disease consult. I advised him to see the dentist today.

## 2015-08-27 NOTE — Patient Instructions (Signed)
Start taking the augmetin for possible diverticulitis unapparent on your CT scan Assuming you are ok, please come in tomorrow for a repeat blood count.   If your are NOT doing ok as the day goes on (fever over 100, vomiting, feeling worse) please go back to the ER.

## 2015-08-27 NOTE — Progress Notes (Signed)
[facility] [address] [place] [address] [phone]  Date:  08/27/2015   Name:  [name]   DOB:  1946/03/01   MRN:  [id number]  PCP:  Jenny Reichmann, MD    Chief Complaint: Follow-up   History of Present Illness:  [name] is a 69 y.o. very pleasant male patient who presents with the following:  Here today for a recheck- he has been seen by my partner Dr. Everlene Farrier over the last 10 days with fevers of unknown origin.  Dr. Everlene Farrier called to check on him today and he reported that he was feeling worse so he was asked to come in for evaluation.    He notes that he is nauseated, which is new.  No vomiting.  He feels tired No diarrehea He is eating less than usual- mostly soup.  However he is holding his weight.   He continues to have lower belly pain has been present all along- negative CT scan on 9/1, did not show diverticulitis He has noted temp to 100 or 102 in the evernings.    This am temp was 100.6- no meds used  His BP was low this am- 111/50.  He takes it twice a day.  Yesterday he got 120/80 and 120/55  He held his BP medication this am (chlorthalidone)  History of atrial flutter with RVR which is now resolved   Wt Readings from Last 3 Encounters:  08/27/15 264 lb (119.75 kg)  08/24/15 260 lb (117.935 kg)  08/21/15 261 lb (118.389 kg)     BP Readings from Last 3 Encounters:  08/27/15 148/70  08/24/15 122/70  08/21/15 138/70   Pulse Readings from Last 3 Encounters:  08/27/15 100  08/24/15 90  08/21/15 102     Patient Active Problem List   Diagnosis Date Noted  . Low HDL (under 40) 07/12/2013  . Angioedema 11/08/2012  . Atrial flutter with rapid ventricular response 02/04/2012  . Essential hypertension 06/15/2009  . BRONCHOSPASM 06/14/2009    Past Medical History  Diagnosis Date  . Hypertension   . Anxiety   . Pneumonia   . RAD (reactive airway disease)   . Allergic angioedema   . Atrial flutter      Past Surgical History  Procedure Laterality Date  . Appendectomy    . Tonsillectomy    . Colonoscopy    . Cholecystectomy    . Stress myoview  12/13/2008    EF 61% LV  normal    Social History  Substance Use Topics  . Smoking status: Former Smoker    Types: Cigarettes, Pipe    Quit date: 10/05/1996  . Smokeless tobacco: Former Systems developer    Types: Chew     Comment: Occasionally  . Alcohol Use: No    Family History  Problem Relation Age of Onset  . Pancreatic cancer Mother   . Hypertension Mother   . Cancer Mother   . Hypertension Sister   . Asthma Son   . Stroke Father   . Hypertension Father   . Hypertension Brother   . Hypertension Daughter   . Cancer Maternal Grandfather     Allergies  Allergen Reactions  . Ibuprofen Swelling  . Lisinopril Swelling    Swelling of face and lips   . Oxaprozin Swelling    Swelling of face and lips  . Ziac [Bisoprolol-Hydrochlorothiazide] Swelling    Per patient records from Dr. Florina Ou.  Thayer Jew Hcl]  Itching  . Hctz [Hydrochlorothiazide]   . Norvasc [Amlodipine Besylate]     Medication list has been reviewed and updated.  Current Outpatient Prescriptions on File Prior to Visit  Medication Sig Dispense Refill  . cetirizine (ZYRTEC) 10 MG tablet Take 10 mg by mouth daily.    . chlorthalidone (HYGROTON) 25 MG tablet TAKE 1 TABLET BY MOUTH EVERY DAY 30 tablet 8  . diltiazem (CARDIZEM CD) 300 MG 24 hr capsule Take 1 capsule (300 mg total) by mouth daily. 30 capsule 11  . EPINEPHrine (EPI-PEN) 0.3 mg/0.3 mL DEVI Inject 0.3 mLs (0.3 mg total) into the muscle once. 1 Device 1  . Menthol-Camphor (TIGER BALM ARTHRITIS RUB EX) Apply 1 application topically 2 (two) times daily as needed (aching).    . metoprolol succinate (TOPROL-XL) 100 MG 24 hr tablet TAKE 1 TABLET BY MOUTH EVERY DAY WITH OR IMMEDIATELY FOLLOWING A MEAL (Patient taking differently: TAKE 1/2 TABLET BY MOUTH EVERY DAY WITH OR IMMEDIATELY FOLLOWING A  MEAL) 30 tablet 8   No current facility-administered medications on file prior to visit.    Review of Systems:  As per HPI- otherwise negative.   Physical Examination: Filed Vitals:   08/27/15 1118  BP: 148/70  Pulse: 100  Temp: 98.5 F (36.9 C)  Resp: 18   Filed Vitals:   08/27/15 1118  Height: 6\' 2"  (1.88 m)  Weight: 264 lb (119.75 kg)   Body mass index is 33.88 kg/(m^2). Ideal Body Weight: Weight in (lb) to have BMI = 25: 194.3  GEN: WDWN, NAD, Non-toxic, A & O x 3, large build, looks well.  Here with his wife today HEENT: Atraumatic, Normocephalic. Neck supple. No masses, No LAD. Ears and Nose: No external deformity. CV: RRR, No M/G/R. No JVD. No thrill. No extra heart sounds. PULM: CTA B, no wheezes, crackles, rhonchi. No retractions. No resp. distress. No accessory muscle use. ABD: S, ND, +BS. No rebound. No HSM.  He has lower belly tenderness, midline EXTR: No c/c/e NEURO Normal gait.  PSYCH: Normally interactive. Conversant. Not depressed or anxious appearing.  Calm demeanor.  DRE: prostate is non- tender, No gross blood in stool  He denies any blood in his stools or dark, tarry stools  Given IV fluids, 1 liter NS and felt somewhatbetter   Results for orders placed or performed in visit on 08/27/15  POCT CBC  Result Value Ref Range   WBC 8.3 4.6 - 10.2 K/uL   Lymph, poc 3.4 0.6 - 3.4   POC LYMPH PERCENT 40.5 10 - 50 %L   MID (cbc) 0.9 0 - 0.9   POC MID % 10.3 0 - 12 %M   POC Granulocyte 4.1 2 - 6.9   Granulocyte percent 49.2 37 - 80 %G   RBC 4.40 (A) 4.69 - 6.13 M/uL   Hemoglobin 12.9 (A) 14.1 - 18.1 g/dL   HCT, POC 38.8 (A) 43.5 - 53.7 %   MCV 88.2 80 - 97 fL   MCH, POC 29.4 27 - 31.2 pg   MCHC 33.3 31.8 - 35.4 g/dL   RDW, POC 13.2 %   Platelet Count, POC 285 142 - 424 K/uL   MPV 6.0 0 - 99.8 fL  IFOBT POC (occult bld, rslt in office)  Result Value Ref Range   IFOBT Negative    Assessment and Plan: Fever of unknown origin - Plan: POCT CBC,  PSA, Comprehensive metabolic panel, amoxicillin-clavulanate (AUGMENTIN) 875-125 MG per tablet  Abdominal pain, left lower quadrant -  Plan: Comprehensive metabolic panel, amoxicillin-clavulanate (AUGMENTIN) 875-125 MG per tablet  Deficiency anemia - Plan: CBC, IFOBT POC (occult bld, rslt in office)  Here today to recheck fevers of about 2 weeks duration without a definite source, and lower abd pain Discussed with pt and his wife.  His drop is hg is non- specific but does need to be follow-up closely.  His hemasure however was negative discussed repeating CT scan of his belly, as the pain there is his only focal sx.  However he would prefer not to repeat scan if possible.  He would like to treat for possible diverticulitis; he did have diverticulosis although no diverticulitis seen on his CT.  This is a reasonable plan- will start augmentin  Recheck CBC tomorrow to look for any further change in Hg.  If not doing ok he will go to the ER See patient instructions for more details.    His PSA did not get run last week- will repeat this today   Signed Lamar Blinks, MD

## 2015-08-27 NOTE — Discharge Instructions (Signed)

## 2015-08-27 NOTE — ED Notes (Signed)
Pt with hx of diverticulosis c/o abdominal pain x 3 weeks, went to PCP today and was sent here. Feverish, BP low for patient. Denies melena or rectal bleeding.

## 2015-08-28 ENCOUNTER — Telehealth: Payer: Self-pay | Admitting: Emergency Medicine

## 2015-08-28 ENCOUNTER — Telehealth: Payer: Self-pay | Admitting: Family Medicine

## 2015-08-28 LAB — PSA: PSA: 0.78 ng/mL (ref <=4.00)

## 2015-08-28 LAB — CYTOMEGALOVIRUS CULTURE

## 2015-08-28 LAB — CMV IGM: CMV IGM: 151 [AU]/ml — AB (ref <30.00)

## 2015-08-28 LAB — CYTOMEGALOVIRUS ANTIBODY, IGG: Cytomegalovirus Ab-IgG: 0.56 U/mL (ref <0.60)

## 2015-08-28 NOTE — Telephone Encounter (Signed)
I called and spoke with Dr. Megan Salon. He was able to review the blood work. He states he feels this would be  consistent with CMV infection. We will await the results of his repeat CMV IgM and if this result is normal or dropping he will work the patient in for evaluation.

## 2015-08-28 NOTE — ED Provider Notes (Signed)
CSN: [id number]     Arrival date & time 08/27/15  1724 History   First MD Initiated Contact with Patient 08/27/15 2040     Chief Complaint  Patient presents with  . Abdominal Pain     (Consider location/radiation/quality/duration/timing/severity/associated sxs/prior Treatment) Patient is a 69 y.o. male presenting with abdominal pain. The history is provided by the patient.  Abdominal Pain Pain location:  LLQ and RLQ Pain quality: sharp and shooting   Pain radiates to:  Does not radiate Pain severity:  Severe Onset quality:  Sudden Duration:  3 weeks Timing:  Constant Progression:  Worsening Chronicity:  New Relieved by:  Nothing Worsened by:  Nothing tried Ineffective treatments:  None tried Associated symptoms: no chest pain, no chills, no diarrhea, no fever, no shortness of breath and no vomiting    69 yo M with a chief complaint of abdominal pain. This been going on for 3 weeks. Pain is lower dull comes and goes. Patient having fevers with this as well. The started on Augmentin for possible diverticulitis. Has had a CT scan at this time.  Has been treated for diverticulitis since then. PCP is concerned for the continuing of symptoms. With the fever was sent for repeat imaging. Patient denies dysuria denies back pain. Denies epigastric pain. Some nausea denies vomiting.  Past Medical History  Diagnosis Date  . Hypertension   . Anxiety   . Pneumonia   . RAD (reactive airway disease)   . Allergic angioedema   . Atrial flutter    Past Surgical History  Procedure Laterality Date  . Appendectomy    . Tonsillectomy    . Colonoscopy    . Cholecystectomy    . Stress myoview  12/13/2008    EF 61% LV  normal   Family History  Problem Relation Age of Onset  . Pancreatic cancer Mother   . Hypertension Mother   . Cancer Mother   . Hypertension Sister   . Asthma Son   . Stroke Father   . Hypertension Father   . Hypertension Brother   . Hypertension Daughter   . Cancer  Maternal Grandfather    Social History  Substance Use Topics  . Smoking status: Former Smoker    Types: Cigarettes, Pipe    Quit date: 10/05/1996  . Smokeless tobacco: Former Systems developer    Types: Chew     Comment: Occasionally  . Alcohol Use: No    Review of Systems  Constitutional: Negative for fever and chills.  HENT: Negative for congestion and facial swelling.   Eyes: Negative for discharge and visual disturbance.  Respiratory: Negative for shortness of breath.   Cardiovascular: Negative for chest pain and palpitations.  Gastrointestinal: Positive for abdominal pain. Negative for vomiting and diarrhea.  Musculoskeletal: Negative for myalgias and arthralgias.  Skin: Negative for color change and rash.  Neurological: Negative for tremors, syncope and headaches.  Psychiatric/Behavioral: Negative for confusion and dysphoric mood.      Allergies  Ibuprofen; Lisinopril; Oxaprozin; Ziac; Bystolic; Hctz; and Norvasc  Home Medications   Prior to Admission medications   Medication Sig Start Date End Date Taking? Authorizing Provider  amoxicillin-clavulanate (AUGMENTIN) 875-125 MG per tablet Take 1 tablet by mouth 2 (two) times daily. 08/27/15  Yes Gay Filler Copland, MD  cetirizine (ZYRTEC) 10 MG tablet Take 10 mg by mouth daily.   Yes Historical Provider, MD  chlorthalidone (HYGROTON) 25 MG tablet TAKE 1 TABLET BY MOUTH EVERY DAY 04/18/15  Yes Pixie Casino, MD  diltiazem (CARDIZEM CD) 300 MG 24 hr capsule Take 1 capsule (300 mg total) by mouth daily. 02/16/15  Yes Pixie Casino, MD  EPINEPHrine (EPI-PEN) 0.3 mg/0.3 mL DEVI Inject 0.3 mLs (0.3 mg total) into the muscle once. 10/19/12  Yes Eleanore Kurtis Bushman, PA-C  Menthol-Camphor (TIGER BALM ARTHRITIS RUB EX) Apply 1 application topically 2 (two) times daily as needed (aching).   Yes Historical Provider, MD  metoprolol succinate (TOPROL-XL) 100 MG 24 hr tablet TAKE 1 TABLET BY MOUTH EVERY DAY WITH OR IMMEDIATELY FOLLOWING A MEAL Patient  taking differently: TAKE 1/2 TABLET BY MOUTH EVERY DAY WITH OR IMMEDIATELY FOLLOWING A MEAL 04/30/15  Yes Pixie Casino, MD  ondansetron (ZOFRAN ODT) 4 MG disintegrating tablet 4mg  ODT q4 hours prn nausea/vomit 08/27/15   Deno Etienne, DO   BP 131/59 mmHg  Pulse 89  Temp(Src) 98.7 F (37.1 C) (Oral)  Resp 20  SpO2 96% Physical Exam  Constitutional: He is oriented to person, place, and time. He appears well-developed and well-nourished.  HENT:  Head: Normocephalic and atraumatic.  Eyes: EOM are normal. Pupils are equal, round, and reactive to light.  Neck: Normal range of motion. Neck supple. No JVD present.  Cardiovascular: Normal rate and regular rhythm.  Exam reveals no gallop and no friction rub.   No murmur heard. Pulmonary/Chest: No respiratory distress. He has no wheezes.  Abdominal: He exhibits no distension. There is tenderness (Mild lower, mild epigastric). There is no rebound and no guarding.  Musculoskeletal: Normal range of motion.  Neurological: He is alert and oriented to person, place, and time.  Skin: No rash noted. No pallor.  Psychiatric: He has a normal mood and affect. His behavior is normal.    ED Course  Procedures (including critical care time) Labs Review Labs Reviewed  LIPASE, BLOOD - Abnormal; Notable for the following:    Lipase 74 (*)    All other components within normal limits  COMPREHENSIVE METABOLIC PANEL - Abnormal; Notable for the following:    Sodium 132 (*)    Chloride 98 (*)    Glucose, Bld 173 (*)    Calcium 8.2 (*)    AST 47 (*)    All other components within normal limits  CBC - Abnormal; Notable for the following:    HCT 37.2 (*)    All other components within normal limits  URINALYSIS, ROUTINE W REFLEX MICROSCOPIC (NOT AT [facility]) - Abnormal; Notable for the following:    Color, Urine AMBER (*)    All other components within normal limits    Imaging Review Ct Abdomen Pelvis W Contrast  08/27/2015   CLINICAL DATA:  Abdominal pain 3  weeks with fever. Previous appendectomy.  EXAM: CT ABDOMEN AND PELVIS WITH CONTRAST  TECHNIQUE: Multidetector CT imaging of the abdomen and pelvis was performed using the standard protocol following bolus administration of intravenous contrast.  CONTRAST:  152mL OMNIPAQUE IOHEXOL 300 MG/ML  SOLN  COMPARISON:  08/16/2015  FINDINGS: Lung bases are normal.  Abdominal images demonstrate a normal liver, pancreas, gallbladder and adrenal glands. There is mild splenomegaly measuring 15.7 cm without significant change. 1.3 cm hypodensity over the posterior aspect of the spleen likely a hemangioma versus flow artifact.  Kidneys are normal in size without hydronephrosis or nephrolithiasis. 1 cm cyst over the lower pole of the right kidney unchanged. 1.8 cm hypodensity over the lower pole cortex of the left kidney unchanged in size with Hounsfield unit measurements 23 which is indeterminate but likely a cyst.  Sub cm left renal cortical hypodensity likely a cyst but too small to characterize. Ureters are within normal.  Evidence of previous appendectomy. Minimal diverticulosis of the colon. No active information. Small bowel is within normal. Mesentery is within normal.  There is mild calcified plaque over the abdominal aorta and iliac arteries.  Pelvic images demonstrate a small left inguinal hernia containing only peritoneal fat. The bladder and rectum are within normal. Minimal stable prominence of the prostate gland.  Mild degenerate change of the spine and hips.  IMPRESSION: No acute findings in the abdomen/pelvis.  Minimal diverticulosis of the colon.  Mild stable splenomegaly. 1.3 cm hypodensity over the spleen likely a hemangioma versus flow artifact.  Bilateral renal cysts. Stable 1.8 cm hypodensity over the lower pole of the left kidney likely a cyst but indeterminate. Recommend followup CT with and without contrast on an elective basis.  Small left inguinal hernia containing only peritoneal fat unchanged.    Electronically Signed   By: Marin Olp M.D.   On: 08/27/2015 22:34   I have personally reviewed and evaluated these images and lab results as part of my medical decision-making.   EKG Interpretation None      MDM   Final diagnoses:  Acute pancreatitis, unspecified pancreatitis type    69 yo M with a chief complaint of lower abdominal pain. This been going on for 3 weeks. Laboratory evaluation significant for elevated lipase level. Concern for possible pancreatitis. Patient doing well able tolerate by mouth without difficulty. Not requiring pain medicine on the ED. CT scan without palpitations or pancreatitis. Diverticulosis without diverticulitis. Will have continue his antibiotics.   I have discussed the diagnosis/risks/treatment options with the patient and family and believe the pt to be eligible for discharge home to follow-up with PCP. We also discussed returning to the ED immediately if new or worsening sx occur. We discussed the sx which are most concerning (e.g., sudden worsening symptoms) that necessitate immediate return. Medications administered to the patient during their visit and any new prescriptions provided to the patient are listed below.  Medications given during this visit Medications  acetaminophen (TYLENOL) tablet 650 mg (650 mg Oral Given 08/27/15 1831)  iohexol (OMNIPAQUE) 300 MG/ML solution 100 mL (100 mLs Intravenous Contrast Given 08/27/15 2200)    Discharge Medication List as of 08/27/2015 11:24 PM    START taking these medications   Details  ondansetron (ZOFRAN ODT) 4 MG disintegrating tablet 4mg  ODT q4 hours prn nausea/vomit, Print         The patient appears reasonably screen and/or stabilized for discharge and I doubt any other medical condition or other [facility] requiring further screening, evaluation, or treatment in the ED at this time prior to discharge.      Deno Etienne, DO 08/28/15 0020

## 2015-08-28 NOTE — Telephone Encounter (Signed)
Dr Everlene Farrier called and spoke with Dr. Megan Salon. He was able to review the blood work. He states he feels this would be consistent with CMV infection. We will await the results of his repeat CMV IgM and if this result is normal or dropping he will work the patient in for evaluation.

## 2015-08-29 ENCOUNTER — Telehealth: Payer: Self-pay | Admitting: Emergency Medicine

## 2015-08-29 ENCOUNTER — Telehealth: Payer: Self-pay | Admitting: Internal Medicine

## 2015-08-29 ENCOUNTER — Ambulatory Visit (HOSPITAL_COMMUNITY): Payer: Medicare HMO

## 2015-08-29 NOTE — Telephone Encounter (Signed)
Pt called in stating that he is still running a 100 degree fever and and his BP is running high. He wanted to know if there is any way he could be checked out while he is in the office having his Echo done today, he doesn't feel 100% comfortable with waiting another few days to get the results from the exam. Please f/u with the pt   Thanks

## 2015-08-29 NOTE — Telephone Encounter (Signed)
Patient in ED at [facility] to be evaluated and most likely admitted.

## 2015-08-29 NOTE — Telephone Encounter (Signed)
Patient has been seeing his PCP the last 3-4 weeks being worked up for fever of 100 during day and 102 during the night.  Has had CT, UA, Lab work.  Cytomegalovirus antibody and has been referred to infectious disease MD with appointment on 9/27  Was started on amoxicillin   On Monday  Stated his blood pressure was running : 110/55 with HR 102-120 . On 9/12 stated BP 111/50 . PCP OV 9/12: 132/65 P 90  Said he was feeling worse everyday.  Told him to call his PCP today and let him know that you are feeling worse today along with BP reading and current HR  Wants to see Dr. Debara Pickett for an additional opinion as to the reason for his fevers.  Has echo today at [place] him to follow up with PCP today since he has been managing his care with these symptoms

## 2015-08-29 NOTE — Telephone Encounter (Signed)
Nothing more to add at this time - agree with having Dr. Everlene Farrier see him again. He may want to prescribe antiviral treatment, but usually CMV infections are self-limiting, but can last for weeks. Would recommend tylenol for fevers. Will look for echo results later today.  Thanks.  Dr. Lemmie Evens

## 2015-08-30 LAB — CULTURE, BLOOD (SINGLE): ORGANISM ID, BACTERIA: NO GROWTH

## 2015-08-30 NOTE — Telephone Encounter (Signed)
Can this encounter be closed?

## 2015-09-06 ENCOUNTER — Encounter: Payer: Self-pay | Admitting: Emergency Medicine

## 2015-09-06 ENCOUNTER — Ambulatory Visit (INDEPENDENT_AMBULATORY_CARE_PROVIDER_SITE_OTHER): Payer: Medicare HMO | Admitting: Emergency Medicine

## 2015-09-06 ENCOUNTER — Other Ambulatory Visit: Payer: Self-pay | Admitting: Emergency Medicine

## 2015-09-06 VITALS — BP 128/66 | HR 89 | Temp 98.7°F | Resp 16 | Ht 74.0 in | Wt 268.4 lb

## 2015-09-06 DIAGNOSIS — R894 Abnormal immunological findings in specimens from other organs, systems and tissues: Secondary | ICD-10-CM | POA: Diagnosis not present

## 2015-09-06 DIAGNOSIS — R509 Fever, unspecified: Secondary | ICD-10-CM | POA: Diagnosis not present

## 2015-09-06 DIAGNOSIS — R768 Other specified abnormal immunological findings in serum: Secondary | ICD-10-CM

## 2015-09-06 DIAGNOSIS — B882 Other arthropod infestations: Secondary | ICD-10-CM | POA: Diagnosis not present

## 2015-09-06 DIAGNOSIS — R740 Nonspecific elevation of levels of transaminase and lactic acid dehydrogenase [LDH]: Secondary | ICD-10-CM

## 2015-09-06 DIAGNOSIS — R7401 Elevation of levels of liver transaminase levels: Secondary | ICD-10-CM

## 2015-09-06 LAB — COMPLETE METABOLIC PANEL WITH GFR
ALBUMIN: 3.1 g/dL — AB (ref 3.6–5.1)
ALT: 70 U/L — ABNORMAL HIGH (ref 9–46)
AST: 52 U/L — AB (ref 10–35)
Alkaline Phosphatase: 64 U/L (ref 40–115)
BUN: 18 mg/dL (ref 7–25)
CHLORIDE: 101 mmol/L (ref 98–110)
CO2: 27 mmol/L (ref 20–31)
Calcium: 8.5 mg/dL — ABNORMAL LOW (ref 8.6–10.3)
Creat: 0.85 mg/dL (ref 0.70–1.25)
GFR, Est African American: >89 mL/min (ref >=60)
GFR, Est Non African American: >89 mL/min (ref >=60)
GLUCOSE: 112 mg/dL — AB (ref 65–99)
POTASSIUM: 4.6 mmol/L (ref 3.5–5.3)
SODIUM: 137 mmol/L (ref 135–146)
Total Bilirubin: 0.5 mg/dL (ref 0.2–1.2)
Total Protein: 6.6 g/dL (ref 6.1–8.1)

## 2015-09-06 LAB — C-REACTIVE PROTEIN: CRP: 1 mg/dL — ABNORMAL HIGH (ref <0.60)

## 2015-09-06 NOTE — Progress Notes (Addendum)
Patient ID: [name], male   DOB: Jan 22, 1946, 69 y.o.   MRN: [id number]    This chart was scribed for Nena Jordan, MD by [facility], medical scribe at [facility].The patient was seen in exam room 22 and the patient's care was started at 11:59 AM.  Chief Complaint:  Chief Complaint  Patient presents with  . Follow-up    hospital follow up   HPI: [name] is a 69 y.o. male who reports to [facility] today for a hospital follow up. Admitted at [place] center because of a suspected tick-borne illness. Placed on doxycyline and markedly improved within hours. Recommended a follow up with his PCP in 1-2 weeks where a follow up CMP should be done to ensure down-trending of LFT's. Was not called for a brucellosis titer, results still pending. Chest x-ray was normal, ECHO was normal, 12 viral testing was negative. US showed a small cyst in the kidney and fatty changes of the liver. 2-3 mm nodules found in both upper lobes. No Hx of lung cancer. Recommended repeat CT in 1 year. Abdominal pain has improved, no rashes present. Will hold off on flu shot today. Does have a follow up with infectious disease. Scheduled. This was a complicated case. Patient had fever off and on for 3 weeks primarily with low abdominal pain. Initially his CMV titers were elevated with a rise in the IgM. He had elevation in his liver function tests. He continued to decline in health and subsequently was admitted to infectious disease at [facility]. Immediately after starting on doxycycline he noted improvement. Other testing done at [facility] was unrevealing. Viral screening was negative. Brucellosis titer was negative. He did not have any further titers done for tick diseases. He had already had Lyme and spotted fever titers done. Almost immediately after starting on doxycycline he felt better. He has been trying TEP better. He continues to be very weak. He does note improvement and no fevers greater  than 100 since discharge from the hospital.   Past Medical History  Diagnosis Date  . Hypertension   . Anxiety   . Pneumonia   . RAD (reactive airway disease)   . Allergic angioedema   . Atrial flutter    Past Surgical History  Procedure Laterality Date  . Appendectomy    . Tonsillectomy    . Colonoscopy    . Cholecystectomy    . Stress myoview  12/13/2008    EF 61% LV  normal   Social History   Social History  . Marital Status: Married    Spouse Name: N/A  . Number of Children: N/A  . Years of Education: N/A   Social History Main Topics  . Smoking status: Former Smoker    Types: Cigarettes, Pipe    Quit date: 10/05/1996  . Smokeless tobacco: Former Systems developer    Types: Chew     Comment: Occasionally  . Alcohol Use: No  . Drug Use: No  . Sexual Activity: Yes    Birth Control/ Protection: None   Other Topics Concern  . None   Social History Narrative   Family History  Problem Relation Age of Onset  . Pancreatic cancer Mother   . Hypertension Mother   . Cancer Mother   . Hypertension Sister   . Asthma Son   . Stroke Father   . Hypertension Father   . Hypertension Brother   . Hypertension Daughter   . Cancer Maternal Grandfather  Allergies  Allergen Reactions  . Ibuprofen Swelling  . Lisinopril Swelling    Swelling of face and lips   . Oxaprozin Swelling    Swelling of face and lips  . Ziac [Bisoprolol-Hydrochlorothiazide] Swelling    Per patient records from Dr. Florina Ou.  Thayer Jew Hcl] Itching  . Hctz [Hydrochlorothiazide]   . Norvasc [Amlodipine Besylate]    Prior to Admission medications   Medication Sig Start Date End Date Taking? Authorizing [name]  amoxicillin-clavulanate (AUGMENTIN) 875-125 MG per tablet Take 1 tablet by mouth 2 (two) times daily. 08/27/15   Gay Filler Copland, MD  cetirizine (ZYRTEC) 10 MG tablet Take 10 mg by mouth daily.    Historical Tymarion Everard, MD  chlorthalidone (HYGROTON) 25 MG tablet TAKE 1 TABLET BY  MOUTH EVERY DAY 04/18/15   Pixie Casino, MD  diltiazem (CARDIZEM CD) 300 MG 24 hr capsule Take 1 capsule (300 mg total) by mouth daily. 02/16/15   Pixie Casino, MD  EPINEPHrine (EPI-PEN) 0.3 mg/0.3 mL DEVI Inject 0.3 mLs (0.3 mg total) into the muscle once. 10/19/12   Eleanore Kurtis Bushman, PA-C  Menthol-Camphor (TIGER BALM ARTHRITIS RUB EX) Apply 1 application topically 2 (two) times daily as needed (aching).    Historical Sruti Ayllon, MD  metoprolol succinate (TOPROL-XL) 100 MG 24 hr tablet TAKE 1 TABLET BY MOUTH EVERY DAY WITH OR IMMEDIATELY FOLLOWING A MEAL Patient taking differently: TAKE 1/2 TABLET BY MOUTH EVERY DAY WITH OR IMMEDIATELY FOLLOWING A MEAL 04/30/15   Pixie Casino, MD  ondansetron (ZOFRAN ODT) 4 MG disintegrating tablet 4mg  ODT q4 hours prn nausea/vomit 08/27/15   Deno Etienne, DO   ROS: The patient denies fevers, chills, night sweats, unintentional weight loss, chest pain, palpitations, wheezing, dyspnea on exertion, nausea, vomiting, abdominal pain, dysuria, hematuria, melena, numbness, weakness, or tingling.  All other systems have been reviewed and were otherwise negative with the exception of those mentioned in the HPI and as above.    PHYSICAL EXAM: Filed Vitals:   09/06/15 1149  BP: 128/66  Pulse: 89  Temp: 98.7 F (37.1 C)  Resp: 16   Body mass index is 34.45 kg/(m^2).  General: Alert, no acute distress HEENT:  Normocephalic, atraumatic, oropharynx patent. Eye: Juliette Mangle [facility] Cardiovascular:  Regular rate and rhythm, no rubs murmurs or gallops.  No Carotid bruits, radial pulse intact. No pedal edema.  Respiratory: Clear to auscultation bilaterally.  No wheezes, rales, or rhonchi.  No cyanosis, no use of accessory musculature Abdominal: No organomegaly, abdomen is soft and non-tender, positive bowel sounds.  No masses. Musculoskeletal: Gait intact. No edema, tenderness Skin: No rashes. Neurologic: Facial musculature symmetric. Psychiatric: Patient acts appropriately  throughout our interaction. Lymphatic: No cervical or submandibular lymphadenopathy  LABS:  EKG/XRAY:   Primary read interpreted by Dr. Everlene Farrier at [facility].  ASSESSMENT/PLAN: Vital signs are stable. Patient markedly better since initiation of doxycycline therapy. He has a suspected tick illness. 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 He still feels very fatigued and weak. Labs were repeated today. I held off on his flu shot since he is still recovering from illness. Gross sideeffects, risk and benefits, and alternatives of medications d/w patient. Patient is aware that all medications have potential sideeffects and we are unable to predict every sideeffect or drug-drug interaction that may occur.  By signing my name below, I, Nadim Abuhashem, attest that this documentation has been prepared under the direction and in the presence of Nena Jordan, MD.  Electronically Signed: Lora Havens, medical  scribe. 09/06/2015, 12:16 PM.  [name] MD 09/06/2015 12:16 PM

## 2015-09-07 ENCOUNTER — Telehealth: Payer: Self-pay | Admitting: Family Medicine

## 2015-09-07 DIAGNOSIS — D751 Secondary polycythemia: Secondary | ICD-10-CM

## 2015-09-07 DIAGNOSIS — R945 Abnormal results of liver function studies: Principal | ICD-10-CM

## 2015-09-07 DIAGNOSIS — R7989 Other specified abnormal findings of blood chemistry: Secondary | ICD-10-CM

## 2015-09-07 LAB — CBC WITH DIFFERENTIAL/PLATELET
BASOS ABS: 0.1 10*3/uL (ref 0.0–0.1)
BASOS PCT: 1 % (ref 0–1)
Eosinophils Absolute: 0.1 10*3/uL (ref 0.0–0.7)
Eosinophils Relative: 1 % (ref 0–5)
HCT: 36.8 % — ABNORMAL LOW (ref 39.0–52.0)
HEMOGLOBIN: 12.8 g/dL — AB (ref 13.0–17.0)
Lymphocytes Relative: 62 % — ABNORMAL HIGH (ref 12–46)
Lymphs Abs: 5.7 10*3/uL — ABNORMAL HIGH (ref 0.7–4.0)
MCH: 31.1 pg (ref 26.0–34.0)
MCHC: 34.8 g/dL (ref 30.0–36.0)
MCV: 89.5 fL (ref 78.0–100.0)
MONO ABS: 1.2 10*3/uL — AB (ref 0.1–1.0)
MPV: 8.4 fL — AB (ref 8.6–12.4)
Monocytes Relative: 13 % — ABNORMAL HIGH (ref 3–12)
NEUTROS ABS: 2.1 10*3/uL (ref 1.7–7.7)
NEUTROS PCT: 23 % — AB (ref 43–77)
Platelets: 218 10*3/uL (ref 150–400)
RBC: 4.11 MIL/uL — AB (ref 4.22–5.81)
RDW: 15 % (ref 11.5–15.5)
WBC: 9.2 10*3/uL (ref 4.0–10.5)

## 2015-09-07 NOTE — Telephone Encounter (Signed)
Patient notified to keep both ID appts, appt at [facility] and the appt at [facility] next week. Advised him to call Dr. Everlene Farrier this evening, per Dr. Everlene Farrier, so he can discuss why he wants him to see both.

## 2015-09-08 NOTE — Telephone Encounter (Signed)
I called and spoke with patient. His recent blood work with his lymphocytosis raises the question of a persistent CMV infection. He does seem to have responded to doxycycline for tick borne disease. I do want him to see the ID specialist here on Tuesday and also to see ID at [facility] in 10 days. I have already called the patient.

## 2015-09-09 NOTE — Telephone Encounter (Signed)
Dr Everlene Farrier has asked me to check on the CMV IGM / IGG I entered some labs in error/ have cancelled  I called [facility] to see if these have been added on for patient, these were done on 08/24/15 verified with Dr Everlene Farrier these do need to be added.

## 2015-09-09 NOTE — Telephone Encounter (Signed)
I was able to add CMV IgG/IgM but unfortunately could not add the path smear/ the lavendar top is discarded after 24 hrs.

## 2015-09-11 ENCOUNTER — Ambulatory Visit: Payer: Medicare HMO | Admitting: Internal Medicine

## 2015-09-11 ENCOUNTER — Ambulatory Visit (INDEPENDENT_AMBULATORY_CARE_PROVIDER_SITE_OTHER): Payer: Medicare HMO | Admitting: Internal Medicine

## 2015-09-11 ENCOUNTER — Encounter: Payer: Self-pay | Admitting: Internal Medicine

## 2015-09-11 VITALS — BP 149/72 | HR 85 | Temp 98.9°F | Ht 73.0 in | Wt 273.0 lb

## 2015-09-11 DIAGNOSIS — B259 Cytomegaloviral disease, unspecified: Secondary | ICD-10-CM

## 2015-09-11 LAB — CMV ABS, IGG+IGM (CYTOMEGALOVIRUS): Cytomegalovirus Ab-IgG: 3.4 U/mL — ABNORMAL HIGH (ref <0.60)

## 2015-09-11 NOTE — Progress Notes (Signed)
[facility]      Reason for Consult: recent infection, tick vs CMV    Referring Physician: Dr. Everlene Farrier    Patient ID: [name], male    DOB: [birth year], 69 y.o.   MRN: [id number]  HPI:   Here for evaluation of recent febrile illness.  He was seen in Early September for a febrile illness and abdominal pain.  Temp up to 102.  Some nausea.  He was seen by his PCP and noted transaminitis and other tests sent and the only significant finding was CMV IgM.  EBV c/w old infection, HIV negative,hepatitis studies negative.  He was seen multiple times by PCP and given Augmentin for possible diverticulitis due to his known diverticulosis.  He then went to [facility] and was admitted from 9/14-9/18.  He was seen there by ID and work up included negative EBV, RMSF, Lyme, UA, HIV, hepatitis panel, CT abdomen and elevated IgM for CMV.  He was febrile to 101 and was put empirically on doxycycline for presumed tick borne illness.  He defervesced and was sent out to complete a 14 day course of doxycycline.  He was then seen by his PCP last week and had significant fatigue since his illness, though no further fever, no chills, no weight loss, no loss of appetite.  Does continue to have abdominal pain and still has 4 days left of doxycycline.  Never developed rash, facial droop, joint swelling, diarrhea, dysuria.  Has not been able to restart his exercise routine.    Past Medical History  Diagnosis Date  . Hypertension   . Anxiety   . Pneumonia   . RAD (reactive airway disease)   . Allergic angioedema   . Atrial flutter     Prior to Admission medications   Medication Sig Start Date End Date Taking? Authorizing Provider  cetirizine (ZYRTEC) 10 MG tablet Take 10 mg by mouth daily.   Yes Historical Provider, MD  diltiazem (CARDIZEM CD) 300 MG 24 hr capsule Take 1 capsule (300 mg total) by mouth daily. 02/16/15  Yes Pixie Casino, MD  doxycycline (VIBRA-TABS) 100 MG tablet Take 100  mg by mouth 2 (two) times daily.   Yes Historical Provider, MD  EPINEPHrine (EPI-PEN) 0.3 mg/0.3 mL DEVI Inject 0.3 mLs (0.3 mg total) into the muscle once. 10/19/12  Yes Theda Sers, PA-C    Allergies  Allergen Reactions  . Ibuprofen Swelling  . Lisinopril Swelling    Swelling of face and lips   . Oxaprozin Swelling    Swelling of face and lips  . Ziac [Bisoprolol-Hydrochlorothiazide] Swelling    Per patient records from Dr. Florina Ou.  Thayer Jew Hcl] Itching  . Hctz [Hydrochlorothiazide]   . Norvasc [Amlodipine Besylate]     Social History  Substance Use Topics  . Smoking status: Former Smoker    Types: Cigarettes, Pipe    Quit date: 10/05/1996  . Smokeless tobacco: Former Systems developer    Types: Chew     Comment: Occasionally  . Alcohol Use: No    Family History  Problem Relation Age of Onset  . Pancreatic cancer Mother   . Hypertension Mother   . Cancer Mother   . Hypertension Sister   . Asthma Son   . Stroke Father   . Hypertension Father   . Hypertension Brother   . Hypertension Daughter   . Cancer Maternal Grandfather      Review of Systems A comprehensive review of  systems was negative except for: Constitutional: positive for malaise   Filed Vitals:   09/11/15 1101  BP: 149/72  Pulse: 85  Temp: 98.9 F (37.2 C)   in no apparent distress and alert HEENT: anicteric Cor RRR and No murmurs clear Bowel sounds are normal, liver is not enlarged, spleen is not enlarged peripheral pulses normal, no pedal edema, no clubbing or cyanosis negative for - jaundice, spider hemangioma, telangiectasia, palmar erythema, ecchymosis and atrophy Musculoskeletal: no joint swelling Lymphadenopathy: no cervical or supraclavicular lad  Labs: Lab Results  Component Value Date   WBC 9.2 09/06/2015   HGB 12.8* 09/06/2015   HCT 36.8* 09/06/2015   MCV 89.5 09/06/2015   PLT 218 09/06/2015    Lab Results  Component Value Date   CREATININE 0.85 09/06/2015    BUN 18 09/06/2015   NA 137 09/06/2015   K 4.6 09/06/2015   CL 101 09/06/2015   CO2 27 09/06/2015    Lab Results  Component Value Date   ALT 70* 09/06/2015   AST 52* 09/06/2015   ALKPHOS 64 09/06/2015   BILITOT 0.5 09/06/2015   INR 1.07 02/04/2012     Assessment: he has a syndrome most consistent with acute CMV infection.  I discussed that his LFT increase, CMV IgM, some viral DNA noted in lab at [facility] and fatigue are consistent with this.  I do not think he had Lyme disease with negative titers (according to D C summary) and no clinical symptoms of Lyme disease and therefore does not need 14 days of doxycycline.  Could have been RMSF and has been adequately treated.   Care Everywhere and hospital summary, reviewed radiology and labs done.     Plan: 1) stop doxycycline 2) increase exercise as tolerated 3) follow up as needed 4) reassurance given

## 2015-09-18 ENCOUNTER — Encounter: Payer: Self-pay | Admitting: Emergency Medicine

## 2015-10-04 ENCOUNTER — Encounter: Payer: Self-pay | Admitting: Emergency Medicine

## 2015-10-04 ENCOUNTER — Ambulatory Visit (INDEPENDENT_AMBULATORY_CARE_PROVIDER_SITE_OTHER): Payer: Medicare HMO | Admitting: Emergency Medicine

## 2015-10-04 VITALS — BP 121/74 | HR 69 | Temp 98.8°F | Resp 16 | Ht 73.0 in | Wt 266.0 lb

## 2015-10-04 DIAGNOSIS — R945 Abnormal results of liver function studies: Secondary | ICD-10-CM

## 2015-10-04 DIAGNOSIS — R7989 Other specified abnormal findings of blood chemistry: Secondary | ICD-10-CM | POA: Diagnosis not present

## 2015-10-04 DIAGNOSIS — B882 Other arthropod infestations: Secondary | ICD-10-CM | POA: Diagnosis not present

## 2015-10-04 DIAGNOSIS — R509 Fever, unspecified: Secondary | ICD-10-CM | POA: Diagnosis not present

## 2015-10-04 DIAGNOSIS — R894 Abnormal immunological findings in specimens from other organs, systems and tissues: Secondary | ICD-10-CM

## 2015-10-04 DIAGNOSIS — R768 Other specified abnormal immunological findings in serum: Secondary | ICD-10-CM

## 2015-10-04 LAB — COMPLETE METABOLIC PANEL WITH GFR
ALBUMIN: 3.7 g/dL (ref 3.6–5.1)
ALK PHOS: 65 U/L (ref 40–115)
ALT: 68 U/L — AB (ref 9–46)
AST: 49 U/L — ABNORMAL HIGH (ref 10–35)
BILIRUBIN TOTAL: 0.6 mg/dL (ref 0.2–1.2)
BUN: 16 mg/dL (ref 7–25)
CALCIUM: 8.7 mg/dL (ref 8.6–10.3)
CHLORIDE: 100 mmol/L (ref 98–110)
CO2: 26 mmol/L (ref 20–31)
CREATININE: 0.94 mg/dL (ref 0.70–1.25)
GFR, Est Non African American: 84 mL/min (ref >=60)
Glucose, Bld: 162 mg/dL — ABNORMAL HIGH (ref 65–99)
Potassium: 4.1 mmol/L (ref 3.5–5.3)
Sodium: 136 mmol/L (ref 135–146)
TOTAL PROTEIN: 7.3 g/dL (ref 6.1–8.1)

## 2015-10-04 LAB — CBC WITH DIFFERENTIAL/PLATELET
BASOS ABS: 0 10*3/uL (ref 0.0–0.1)
Basophils Relative: 0 % (ref 0–1)
EOS PCT: 1 % (ref 0–5)
Eosinophils Absolute: 0.1 10*3/uL (ref 0.0–0.7)
HEMATOCRIT: 41.2 % (ref 39.0–52.0)
Hemoglobin: 14.8 g/dL (ref 13.0–17.0)
LYMPHS ABS: 3.1 10*3/uL (ref 0.7–4.0)
LYMPHS PCT: 47 % — AB (ref 12–46)
MCH: 32 pg (ref 26.0–34.0)
MCHC: 35.9 g/dL (ref 30.0–36.0)
MCV: 89 fL (ref 78.0–100.0)
MPV: 8 fL — ABNORMAL LOW (ref 8.6–12.4)
Monocytes Absolute: 0.7 10*3/uL (ref 0.1–1.0)
Monocytes Relative: 11 % (ref 3–12)
Neutro Abs: 2.7 10*3/uL (ref 1.7–7.7)
Neutrophils Relative %: 41 % — ABNORMAL LOW (ref 43–77)
Platelets: 236 10*3/uL (ref 150–400)
RBC: 4.63 MIL/uL (ref 4.22–5.81)
RDW: 14.3 % (ref 11.5–15.5)
WBC: 6.7 10*3/uL (ref 4.0–10.5)

## 2015-10-04 MED ORDER — DOXYCYCLINE HYCLATE 100 MG PO CAPS: 100.0000 mg | ORAL_CAPSULE | Freq: Two times a day (BID) | ORAL | Status: DC

## 2015-10-04 NOTE — Progress Notes (Addendum)
This chart was scribed for [name], MD by Moises Blood, Medical Scribe. This patient was seen in Room 21 and the patient's care was started 9:46 AM.  Chief Complaint:  Chief Complaint  Patient presents with  . Follow-up  . Hypertension    HPI: [name] is a 69 y.o. male who reports to [facility] today for 1 month follow up.  He feels a lot better. His bloodwork came back for tick illness at [facility] and CMV at [place]. He is still getting some sweats and some lower abd pain (2-3/10 rate). He's taken his 14 day supply of doxycycline with relief. ID told him to make an appointment only if he's still not feeling well.   Past Medical History  Diagnosis Date  . Hypertension   . Anxiety   . Pneumonia   . RAD (reactive airway disease)   . Allergic angioedema   . Atrial flutter [facility])    Past Surgical History  Procedure Laterality Date  . Appendectomy    . Tonsillectomy    . Colonoscopy    . Cholecystectomy    . Stress myoview  12/13/2008    EF 61% LV  normal   Social History   Social History  . Marital Status: Married    Spouse Name: N/A  . Number of Children: N/A  . Years of Education: N/A   Social History Main Topics  . Smoking status: Former Smoker    Types: Cigarettes, Pipe    Quit date: 10/05/1996  . Smokeless tobacco: Former Systems developer    Types: Chew     Comment: Occasionally  . Alcohol Use: No  . Drug Use: No  . Sexual Activity: Yes    Birth Control/ Protection: None   Other Topics Concern  . None   Social History Narrative   Family History  Problem Relation Age of Onset  . Pancreatic cancer Mother   . Hypertension Mother   . Cancer Mother   . Hypertension Sister   . Asthma Son   . Stroke Father   . Hypertension Father   . Hypertension Brother   . Hypertension Daughter   . Cancer Maternal Grandfather    Allergies  Allergen Reactions  . Ibuprofen Swelling  . Lisinopril Swelling    Swelling of face and lips   . Oxaprozin Swelling    Swelling  of face and lips  . Ziac [Bisoprolol-Hydrochlorothiazide] Swelling    Per patient records from Dr. Florina Ou.  Thayer Jew Hcl] Itching  . Hctz [Hydrochlorothiazide]   . Norvasc [Amlodipine Besylate]    Prior to Admission medications   Medication Sig Start Date End Date Taking? Authorizing Provider  cetirizine (ZYRTEC) 10 MG tablet Take 10 mg by mouth daily.    Historical Provider, MD  diltiazem (CARDIZEM CD) 300 MG 24 hr capsule Take 1 capsule (300 mg total) by mouth daily. 02/16/15   Pixie Casino, MD  doxycycline (VIBRA-TABS) 100 MG tablet Take 100 mg by mouth 2 (two) times daily.    Historical Provider, MD  EPINEPHrine (EPI-PEN) 0.3 mg/0.3 mL DEVI Inject 0.3 mLs (0.3 mg total) into the muscle once. 10/19/12   Eleanore Kurtis Bushman, PA-C     ROS:  Constitutional: negative for fever, weight changes, or fatigue; positive for chills, sweats HEENT: negative for vision changes, hearing loss, congestion, rhinorrhea, ST, epistaxis, or sinus pressure Cardiovascular: negative for chest pain or palpitations Respiratory: negative for hemoptysis, wheezing, shortness of breath, or cough Abdominal: negative for nausea,  vomiting, diarrhea, or constipation; positive for abd pain Dermatological: negative for rash Neurologic: negative for headache, dizziness, or syncope All other systems reviewed and are otherwise negative with the exception to those above and in the HPI.  PHYSICAL EXAM: Filed Vitals:   10/04/15 0940  BP: 121/74  Pulse: 69  Temp: 98.8 F (37.1 C)  Resp: 16   Body mass index is 35.1 kg/(m^2).   General: Alert, no acute distress; perspiration  HEENT:  Normocephalic, atraumatic, oropharynx patent. Eye: Juliette Mangle [facility] Cardiovascular:  Regular rate and rhythm, no rubs murmurs or gallops.  No Carotid bruits, radial pulse intact. No pedal edema.  Respiratory: Clear to auscultation bilaterally.  No wheezes, rales, or rhonchi.  No cyanosis, no use of accessory  musculature Abdominal: No organomegaly, positive bowel sounds. No masses; mild suprapubic abd pain Musculoskeletal: Gait intact. No edema, tenderness Skin: No rashes. Neurologic: Facial musculature symmetric. Psychiatric: Patient acts appropriately throughout our interaction.  Lymphatic: No cervical or submandibular lymphadenopathy Genitourinary/Anorectal: No acute findings   LABS:    EKG/XRAY:   Primary read interpreted by Dr. Everlene Farrier at [facility].   ASSESSMENT/PLAN: Blood work done today. Will treat with doxycycline for 2 more weeks. He received 14 days of doxycycline. His Lyme titers appear to be positive at [facility] blot confirmatory test appears positive. The antibody titers that we did previously were negative. We'll recheck here in 3 weeks. He is advised to make an appointment to be seen at [facility] in 6 weeks.  By signing my name below, I, Moises Blood, attest that this documentation has been prepared under the direction and in the presence of [name], MD. Electronically Signed: Moises Blood, [place]. 10/04/2015 , 9:46 AM .    Johney Maine sideeffects, risk and benefits, and alternatives of medications d/w patient. Patient is aware that all medications have potential sideeffects and we are unable to predict every sideeffect or drug-drug interaction that may occur.  [name] MD 10/04/2015 9:46 AM

## 2015-10-05 LAB — LYME AB/WESTERN BLOT REFLEX: B BURGDORFERI AB IGG+ IGM: 0.24 {ISR}

## 2015-10-26 ENCOUNTER — Encounter: Payer: Self-pay | Admitting: Emergency Medicine

## 2015-10-26 ENCOUNTER — Ambulatory Visit (INDEPENDENT_AMBULATORY_CARE_PROVIDER_SITE_OTHER): Payer: Medicare HMO | Admitting: Emergency Medicine

## 2015-10-26 VITALS — BP 130/70 | HR 78 | Temp 98.7°F | Resp 16 | Wt 258.8 lb

## 2015-10-26 DIAGNOSIS — B882 Other arthropod infestations: Secondary | ICD-10-CM | POA: Diagnosis not present

## 2015-10-26 DIAGNOSIS — R894 Abnormal immunological findings in specimens from other organs, systems and tissues: Secondary | ICD-10-CM

## 2015-10-26 DIAGNOSIS — R7309 Other abnormal glucose: Secondary | ICD-10-CM | POA: Diagnosis not present

## 2015-10-26 DIAGNOSIS — R799 Abnormal finding of blood chemistry, unspecified: Secondary | ICD-10-CM | POA: Diagnosis not present

## 2015-10-26 DIAGNOSIS — R739 Hyperglycemia, unspecified: Secondary | ICD-10-CM | POA: Diagnosis not present

## 2015-10-26 DIAGNOSIS — R7989 Other specified abnormal findings of blood chemistry: Secondary | ICD-10-CM

## 2015-10-26 DIAGNOSIS — R945 Abnormal results of liver function studies: Secondary | ICD-10-CM

## 2015-10-26 DIAGNOSIS — R1030 Lower abdominal pain, unspecified: Secondary | ICD-10-CM | POA: Diagnosis not present

## 2015-10-26 DIAGNOSIS — R768 Other specified abnormal immunological findings in serum: Secondary | ICD-10-CM

## 2015-10-26 DIAGNOSIS — R109 Unspecified abdominal pain: Secondary | ICD-10-CM | POA: Diagnosis not present

## 2015-10-26 LAB — HEPATIC FUNCTION PANEL
ALBUMIN: 4.6 g/dL (ref 3.6–5.1)
ALT: 26 U/L (ref 9–46)
AST: 21 U/L (ref 10–35)
Alkaline Phosphatase: 109 U/L (ref 40–115)
BILIRUBIN DIRECT: 0.2 mg/dL (ref <=0.2)
Indirect Bilirubin: 0.5 mg/dL (ref 0.2–1.2)
TOTAL PROTEIN: 7.6 g/dL (ref 6.1–8.1)
Total Bilirubin: 0.7 mg/dL (ref 0.2–1.2)

## 2015-10-26 LAB — HEMOGLOBIN A1C
HEMOGLOBIN A1C: 6.5 % — AB (ref <5.7)
Mean Plasma Glucose: 140 mg/dL — ABNORMAL HIGH (ref <117)

## 2015-10-26 LAB — SEDIMENTATION RATE: SED RATE: 24 mm/h — AB (ref 0–20)

## 2015-10-26 MED ORDER — DOXYCYCLINE HYCLATE 100 MG PO CAPS: 100.0000 mg | ORAL_CAPSULE | Freq: Two times a day (BID) | ORAL | Status: DC

## 2015-10-26 NOTE — Progress Notes (Signed)
This chart was scribed for [name], MD by Moises Blood, Medical Scribe. This patient was seen in Room 21 and the patient's care was started 11:04 AM.  Chief Complaint:  Chief Complaint  Patient presents with  . Hypertension  . bloodwork    HPI: [name] is a 69 y.o. male who reports to [facility] today here for bloodwork.  Abd Pain His stomach still hurts, without knowing reason why. He has not seen GI yet. He's been avoiding greasy foods 10-14 weeks. When he was on the antibiotic, he felt better. And when he was off it, he noticed the pain slowly coming back. He feels the pain being 4/10. He denies taking any other medication for it. He's allergic to ibuprofen. He denies fever, urinary symptoms, bowel symptoms, nausea, appetite loss. His last colonoscopy was 2 years ago.   He had 2 CT scans done on his abdomen without acute findings.   Tick disease He will go to ID in 2 weeks. The tests for tick disease were negative here, but positive at [facility].   Exercise He does light exercising at the gym, 3 times a week.   Past Medical History  Diagnosis Date  . Hypertension   . Anxiety   . Pneumonia   . RAD (reactive airway disease)   . Allergic angioedema   . Atrial flutter [facility])    Past Surgical History  Procedure Laterality Date  . Appendectomy    . Tonsillectomy    . Colonoscopy    . Cholecystectomy    . Stress myoview  12/13/2008    EF 61% LV  normal   Social History   Social History  . Marital Status: Married    Spouse Name: N/A  . Number of Children: N/A  . Years of Education: N/A   Social History Main Topics  . Smoking status: Former Smoker    Types: Cigarettes, Pipe    Quit date: 10/05/1996  . Smokeless tobacco: Former Systems developer    Types: Chew     Comment: Occasionally  . Alcohol Use: No  . Drug Use: No  . Sexual Activity: Yes    Birth Control/ Protection: None   Other Topics Concern  . None   Social History Narrative   Family History    Problem Relation Age of Onset  . Pancreatic cancer Mother   . Hypertension Mother   . Cancer Mother   . Hypertension Sister   . Asthma Son   . Stroke Father   . Hypertension Father   . Hypertension Brother   . Hypertension Daughter   . Cancer Maternal Grandfather    Allergies  Allergen Reactions  . Ibuprofen Swelling  . Lisinopril Swelling    Swelling of face and lips   . Oxaprozin Swelling    Swelling of face and lips  . Ziac [Bisoprolol-Hydrochlorothiazide] Swelling    Per patient records from Dr. Florina Ou.  Thayer Jew Hcl] Itching  . Hctz [Hydrochlorothiazide]   . Norvasc [Amlodipine Besylate]    Prior to Admission medications   Medication Sig Start Date End Date Taking? Authorizing Provider  cetirizine (ZYRTEC) 10 MG tablet Take 10 mg by mouth daily.    Historical Provider, MD  chlorthalidone (HYGROTON) 25 MG tablet Take 25 mg by mouth daily.    Historical Provider, MD  diltiazem (CARDIZEM CD) 300 MG 24 hr capsule Take 1 capsule (300 mg total) by mouth daily. 02/16/15   Pixie Casino, MD  doxycycline (VIBRAMYCIN) 100  MG capsule Take 1 capsule (100 mg total) by mouth 2 (two) times daily. 10/04/15   [name], MD  EPINEPHrine (EPI-PEN) 0.3 mg/0.3 mL DEVI Inject 0.3 mLs (0.3 mg total) into the muscle once. 10/19/12   Eleanore Kurtis Bushman, PA-C  metoprolol succinate (TOPROL-XL) 100 MG 24 hr tablet Take 100 mg by mouth daily. Take with or immediately following a meal.    Historical Provider, MD     ROS:  Constitutional: negative for chills, fever, night sweats, weight changes, or fatigue  HEENT: negative for vision changes, hearing loss, congestion, rhinorrhea, ST, epistaxis, or sinus pressure Cardiovascular: negative for chest pain or palpitations Respiratory: negative for hemoptysis, wheezing, shortness of breath, or cough Abdominal: negative for nausea, vomiting, diarrhea, or constipation; positive for abd pain (lower) Dermatological: negative for  rash Neurologic: negative for headache, dizziness, or syncope All other systems reviewed and are otherwise negative with the exception to those above and in the HPI.  PHYSICAL EXAM: Filed Vitals:   10/26/15 1044  BP: 130/70  Pulse: 78  Temp: 98.7 F (37.1 C)  Resp: 16   Body mass index is 34.15 kg/(m^2).   General: Alert, no acute distress HEENT:  Normocephalic, atraumatic, oropharynx patent. Eye: Juliette Mangle [facility] Cardiovascular:  Regular rate and rhythm, no rubs murmurs or gallops.  No Carotid bruits, radial pulse intact. No pedal edema.  Respiratory: Clear to auscultation bilaterally.  No wheezes, rales, or rhonchi.  No cyanosis, no use of accessory musculature Abdominal: No organomegaly; mild tenderness over suprapubic abd  Musculoskeletal: Gait intact. No edema, tenderness Skin: No rashes. Neurologic: Facial musculature symmetric. Psychiatric: Patient acts appropriately throughout our interaction.  Lymphatic: No cervical or submandibular lymphadenopathy Genitourinary/Anorectal: No acute findings   LABS:    EKG/XRAY:   Primary read interpreted by Dr. Everlene Farrier at [facility].   ASSESSMENT/PLAN:  I really cannot explain his persistent abdominal pain.  regurgitation. I did refill his doxycycline since he says as soon as he starts on this medication he improves. Referral made to GI. as stated he has a follow-up appointment with infectious disease at [facility] in 2 weeks.  By signing my name below, I, Moises Blood, attest that this documentation has been prepared under the direction and in the presence of [name], MD. Electronically Signed: Moises Blood, [place]. 10/26/2015 , 11:04 AM .    Johney Maine sideeffects, risk and benefits, and alternatives of medications d/w patient. Patient is aware that all medications have potential sideeffects and we are unable to predict every sideeffect or drug-drug interaction that may occur.  [name] MD 10/26/2015 11:04 AM

## 2015-10-27 LAB — CBC WITH DIFFERENTIAL/PLATELET
Basophils Absolute: 0 10*3/uL (ref 0.0–0.1)
Basophils Relative: 0 % (ref 0–1)
EOS PCT: 1 % (ref 0–5)
Eosinophils Absolute: 0.1 10*3/uL (ref 0.0–0.7)
HEMATOCRIT: 43.3 % (ref 39.0–52.0)
HEMOGLOBIN: 15.1 g/dL (ref 13.0–17.0)
LYMPHS PCT: 49 % — AB (ref 12–46)
Lymphs Abs: 2.7 10*3/uL (ref 0.7–4.0)
MCH: 31.3 pg (ref 26.0–34.0)
MCHC: 34.9 g/dL (ref 30.0–36.0)
MCV: 89.6 fL (ref 78.0–100.0)
MONO ABS: 0.6 10*3/uL (ref 0.1–1.0)
MONOS PCT: 10 % (ref 3–12)
MPV: 8.6 fL (ref 8.6–12.4)
Neutro Abs: 2.2 10*3/uL (ref 1.7–7.7)
Neutrophils Relative %: 40 % — ABNORMAL LOW (ref 43–77)
Platelets: 254 10*3/uL (ref 150–400)
RBC: 4.83 MIL/uL (ref 4.22–5.81)
RDW: 14.1 % (ref 11.5–15.5)
WBC: 5.6 10*3/uL (ref 4.0–10.5)

## 2015-10-29 ENCOUNTER — Encounter: Payer: Self-pay | Admitting: Gastroenterology

## 2015-10-31 ENCOUNTER — Telehealth: Payer: Self-pay | Admitting: Emergency Medicine

## 2015-10-31 NOTE — Telephone Encounter (Signed)
Patient called to get his lab results. Although the patient was notified through [facility], he was not aware of this and has not received any information about his results. I gave the patient the following message from Dr. Everlene Farrier: "Notes Recorded by [name], MD on 10/27/2015 at 5:45 PM Liver tests are all back to normal. Sugar is stable.Notes Recorded by [name], MD on 10/27/2015 at 7:46 AM Sugar better. Liver tests now normal [place]."    Patient is now aware of his results, he acknowledged that he understood and states that he will continue to work on his sugar level. Patient was pleased with the final results. Please call patient if further follow up is needed. [phone]

## 2015-11-01 ENCOUNTER — Ambulatory Visit (INDEPENDENT_AMBULATORY_CARE_PROVIDER_SITE_OTHER): Payer: Medicare HMO | Admitting: Gastroenterology

## 2015-11-01 ENCOUNTER — Encounter: Payer: Self-pay | Admitting: Gastroenterology

## 2015-11-01 VITALS — BP 138/72 | HR 73 | Ht 73.0 in | Wt 262.0 lb

## 2015-11-01 DIAGNOSIS — R93429 Abnormal radiologic findings on diagnostic imaging of unspecified kidney: Secondary | ICD-10-CM

## 2015-11-01 DIAGNOSIS — R509 Fever, unspecified: Secondary | ICD-10-CM

## 2015-11-01 DIAGNOSIS — R1030 Lower abdominal pain, unspecified: Secondary | ICD-10-CM

## 2015-11-01 MED ORDER — CAPSAICIN 0.025 % EX CREA: TOPICAL_CREAM | CUTANEOUS | Status: DC

## 2015-11-01 NOTE — Progress Notes (Signed)
HPI :  69 y/o male here in consultation for abdominal pain from Dr. Nena Jordan.  The patient reports lower abdominal pain that started about 6 months ago. Pain located inferior to the umbilicus, around the belt line, mid abdomen. He thought he had pulled a muscle at the time. He is very active and works outdoors frequently.   The pain has been persistent over time. He reports he had an acute febrile illness about 8 weeks ago - he was diagnosed with an acute CMV infection vs. Tick borne illness. He had fevers up to 102-103 or so. He reports he was hospitalized for a period of time at [facility]. He reports his pain got worse during that time.  He has had 2 CT scans of his abdomen which was unremarkable for a cause for his pain. He was given some doxycyclin which helped made his fever go away, and made some of his abdominal pain better initially slightly. He had severe fatigue and insomnia during this episode. He denied joint pains or rashes. He is confident he was bitten by a tick prior to this episode.   He has since been on a few courses of antibiotics without any improvement in his pain which persists, although at a mild level. He reports the pain is present 24/7, rated roughly 3/10 most of the time. He reports severity can fluctuate. He reports it is sore and tender to the touch. He can't think of anything that makes it better or worse. He denies any prandial component to his pain. He has a good appetite. He doesn't think food intake is related at all to his pain. No nausea or vomiting. He thinks he may have lost some weight has he is eating better, and is trying to lose weight. He denies any positional changes to his pain. He works out a lot at [employer] and works outside. No dysphagia. No odynophagia. He has some belching at times, but no significant reflux. No blood in the stools. No changes in his bowel habits.   Colonoscopy 10/2012 - diverticulosis, otherwise normal.    Past Medical History    Diagnosis Date  . Hypertension   . Anxiety   . Pneumonia   . RAD (reactive airway disease)   . Allergic angioedema   . Atrial flutter [facility])      Past Surgical History  Procedure Laterality Date  . Appendectomy    . Tonsillectomy    . Colonoscopy    . Cholecystectomy    . Stress myoview  12/13/2008    EF 61% LV  normal   Family History  Problem Relation Age of Onset  . Pancreatic cancer Mother   . Hypertension Mother   . Cancer Mother   . Hypertension Sister   . Asthma Son   . Stroke Father   . Hypertension Father   . Hypertension Brother   . Hypertension Daughter   . Cancer Maternal Grandfather    Social History  Substance Use Topics  . Smoking status: Former Smoker    Types: Cigarettes, Pipe    Quit date: 10/05/1996  . Smokeless tobacco: Former Systems developer    Types: Chew     Comment: Occasionally  . Alcohol Use: No   Current Outpatient Prescriptions  Medication Sig Dispense Refill  . cetirizine (ZYRTEC) 10 MG tablet Take 10 mg by mouth daily.    . chlorthalidone (HYGROTON) 25 MG tablet Take 25 mg by mouth daily.    Marland Kitchen diltiazem (CARDIZEM CD) 300 MG  24 hr capsule Take 1 capsule (300 mg total) by mouth daily. 30 capsule 11  . doxycycline (VIBRAMYCIN) 100 MG capsule Take 1 capsule (100 mg total) by mouth 2 (two) times daily. 28 capsule 0  . EPINEPHrine (EPI-PEN) 0.3 mg/0.3 mL DEVI Inject 0.3 mLs (0.3 mg total) into the muscle once. 1 Device 1  . metoprolol succinate (TOPROL-XL) 100 MG 24 hr tablet Take 100 mg by mouth daily. Take with or immediately following a meal.     No current facility-administered medications for this visit.   Allergies  Allergen Reactions  . Ibuprofen Swelling  . Lisinopril Swelling    Swelling of face and lips   . Oxaprozin Swelling    Swelling of face and lips  . Ziac [Bisoprolol-Hydrochlorothiazide] Swelling    Per patient records from Dr. Florina Ou.  Thayer Jew Hcl] Itching  . Hctz [Hydrochlorothiazide]   . Norvasc  [Amlodipine Besylate]      Review of Systems: All systems reviewed and negative except where noted in HPI.   Lab Results  Component Value Date   WBC 5.6 10/26/2015   HGB 15.1 10/26/2015   HCT 43.3 10/26/2015   MCV 89.6 10/26/2015   PLT 254 10/26/2015   Lab Results  Component Value Date   ALT 26 10/26/2015   AST 21 10/26/2015   ALKPHOS 109 10/26/2015   BILITOT 0.7 10/26/2015   Lab Results  Component Value Date   CREATININE 0.94 10/04/2015   BUN 16 10/04/2015   NA 136 10/04/2015   K 4.1 10/04/2015   CL 100 10/04/2015   CO2 26 10/04/2015   Labs positive for markedly positive CMV IgM CT Abdomen 08/27/15 IMPRESSION: No acute findings in the abdomen/pelvis. Minimal diverticulosis of the colon. Mild stable splenomegaly. 1.3 cm hypodensity over the spleen likely a hemangioma versus flow artifact. Bilateral renal cysts. Stable 1.8 cm hypodensity over the lower pole of the left kidney likely a cyst but indeterminate. Recommend followup CT with and without contrast on an elective basis. Small left inguinal hernia containing only peritoneal fat unchanged  Physical Exam: BP 138/72 mmHg  Pulse 73  Ht 6\' 1"  (1.854 m)  Wt 262 lb (118.842 kg)  BMI 34.57 kg/m2  SpO2 97% Constitutional: Pleasant,well-developed, male in no acute distress. HEENT: Normocephalic and atraumatic. Conjunctivae are normal. No scleral icterus. Neck supple.  Cardiovascular: Normal rate, regular rhythm.  Pulmonary/chest: Effort normal and breath sounds normal. No wheezing, rales or rhonchi. Abdominal: Soft, nondistended, protuberant, tenderness to palpation in suprapubic area / beltline mid abdomen, reproduced pain. Equivocal Carnett sign. Bowel sounds active throughout. There are no masses palpable. No hepatomegaly. Extremities: no edema Lymphadenopathy: No cervical adenopathy noted. Neurological: Alert and oriented to person place and time. Skin: Skin is warm and dry. No rashes noted. Psychiatric:  Normal mood and affect. Behavior is normal.   ASSESSMENT AND PLAN: 69 y/o male with a history of recent febrile illness thought to be acute CMV vs. tickborn illness, who is referred for ongoing chronic abdominal pain ongoing for the past 6 months. History as above, pain is constant 24/7 and reproducible to palpation. I can reproduce his pain on exam. He has had 2 CT scans in September without a clear etiology for his pain. He had a transient ALT elevation during his hospitalization thought to be due to acute febrile illness, and LFTs have since normalized.   Based on his description of his pain and his exam today, I suspect he has musculoskeletal / abdominal wall pain.  Recommend he loosen his belt and avoid tight fitting clothes. Recommend a trial of capsaicin cream 0.025% BID to apply to the affected area and see if this helps. If pain persists over time would consider pain management consultation for a trigger point injection, or a trial of TCA or gabapentin. He wished to try topical capsaicin cream first and see how he does.   He can follow up with me for this issue if pain persists. He otherwise is due for a colonoscopy in 10 yrs from last exam, due in 2013.   Finally, CT shows an indeterminate 1.8cm left kidney cyst and is recommending a CT with and without contrast to ensure benign. I will order this for him.   Creekside Cellar, MD [facility] Pager [phone]  CC: Dr. Nena Jordan

## 2015-11-01 NOTE — Patient Instructions (Signed)
We have sent medications to your pharmacy for you to pick up at your convenience   

## 2015-11-02 ENCOUNTER — Telehealth: Payer: Self-pay | Admitting: *Deleted

## 2015-11-02 DIAGNOSIS — R935 Abnormal findings on diagnostic imaging of other abdominal regions, including retroperitoneum: Secondary | ICD-10-CM

## 2015-11-02 NOTE — Telephone Encounter (Signed)
Spoke with [name] and scheduled CT at [facility] CT with renal protocol on 11/13/15 at 1:00 PM. NPO 4 hours prior. Patient will drink water based contrast at CT.Patient notified of appointment.

## 2015-11-02 NOTE — Telephone Encounter (Signed)
-----   Message from Manus Gunning, MD sent at 11/01/2015  4:22 PM EST ----- Rollene Fare, I noted the patient's CT had an abnormality in the kidney which needs a follow up exam per radiology. I don't think it is causing his pain but radiology wants a CT kidney with and without contrast to ensure normal. Can you help coordinate for him? thanks

## 2015-11-02 NOTE — Telephone Encounter (Signed)
Left a message for Erline Levine to call back.

## 2015-11-05 ENCOUNTER — Telehealth: Payer: Self-pay | Admitting: Gastroenterology

## 2015-11-05 ENCOUNTER — Other Ambulatory Visit: Payer: Self-pay | Admitting: Emergency Medicine

## 2015-11-05 DIAGNOSIS — R935 Abnormal findings on diagnostic imaging of other abdominal regions, including retroperitoneum: Secondary | ICD-10-CM

## 2015-11-05 NOTE — Telephone Encounter (Signed)
Called Dr. Everlene Farrier and discussed his case, agreed with CT evaluation and he is referring the patient to Urology. I answered his questions.

## 2015-11-13 ENCOUNTER — Ambulatory Visit (INDEPENDENT_AMBULATORY_CARE_PROVIDER_SITE_OTHER)
Admission: RE | Admit: 2015-11-13 | Discharge: 2015-11-13 | Disposition: A | Discharge status: Home / Self Care | Payer: Medicare HMO | Source: Ambulatory Visit | Attending: Gastroenterology | Admitting: Gastroenterology

## 2015-11-13 DIAGNOSIS — K760 Fatty (change of) liver, not elsewhere classified: Secondary | ICD-10-CM | POA: Diagnosis not present

## 2015-11-13 DIAGNOSIS — R935 Abnormal findings on diagnostic imaging of other abdominal regions, including retroperitoneum: Secondary | ICD-10-CM

## 2015-11-13 MED ORDER — IOHEXOL 300 MG/ML  SOLN
100.0000 mL | Freq: Once | INTRAMUSCULAR | Status: AC | PRN
  Administered 2015-11-13: 100 mL via INTRAVENOUS

## 2015-11-15 ENCOUNTER — Telehealth: Payer: Self-pay | Admitting: Gastroenterology

## 2015-11-15 ENCOUNTER — Other Ambulatory Visit: Payer: Self-pay | Admitting: *Deleted

## 2015-11-15 DIAGNOSIS — K760 Fatty (change of) liver, not elsewhere classified: Secondary | ICD-10-CM

## 2015-11-15 NOTE — Telephone Encounter (Signed)
See results note. 

## 2015-11-21 ENCOUNTER — Other Ambulatory Visit: Payer: Self-pay | Admitting: Internal Medicine

## 2015-11-21 DIAGNOSIS — Z87891 Personal history of nicotine dependence: Secondary | ICD-10-CM | POA: Diagnosis not present

## 2015-11-21 DIAGNOSIS — R109 Unspecified abdominal pain: Secondary | ICD-10-CM | POA: Diagnosis not present

## 2015-11-21 DIAGNOSIS — B259 Cytomegaloviral disease, unspecified: Secondary | ICD-10-CM | POA: Diagnosis not present

## 2015-11-21 DIAGNOSIS — Z5189 Encounter for other specified aftercare: Secondary | ICD-10-CM | POA: Diagnosis not present

## 2015-11-21 DIAGNOSIS — A6920 Lyme disease, unspecified: Secondary | ICD-10-CM | POA: Diagnosis not present

## 2015-11-21 NOTE — Telephone Encounter (Signed)
REFILL 

## 2015-11-22 DIAGNOSIS — Z23 Encounter for immunization: Secondary | ICD-10-CM | POA: Diagnosis not present

## 2015-12-07 ENCOUNTER — Encounter: Payer: Self-pay | Admitting: Emergency Medicine

## 2015-12-07 ENCOUNTER — Ambulatory Visit (INDEPENDENT_AMBULATORY_CARE_PROVIDER_SITE_OTHER): Payer: Medicare HMO | Admitting: Emergency Medicine

## 2015-12-07 VITALS — BP 120/64 | HR 75 | Temp 98.7°F | Resp 16 | Ht 73.0 in | Wt 261.2 lb

## 2015-12-07 DIAGNOSIS — R894 Abnormal immunological findings in specimens from other organs, systems and tissues: Secondary | ICD-10-CM | POA: Diagnosis not present

## 2015-12-07 DIAGNOSIS — R739 Hyperglycemia, unspecified: Secondary | ICD-10-CM | POA: Diagnosis not present

## 2015-12-07 DIAGNOSIS — R768 Other specified abnormal immunological findings in serum: Secondary | ICD-10-CM

## 2015-12-07 DIAGNOSIS — R7989 Other specified abnormal findings of blood chemistry: Secondary | ICD-10-CM | POA: Diagnosis not present

## 2015-12-07 DIAGNOSIS — B882 Other arthropod infestations: Secondary | ICD-10-CM

## 2015-12-07 DIAGNOSIS — R799 Abnormal finding of blood chemistry, unspecified: Secondary | ICD-10-CM | POA: Diagnosis not present

## 2015-12-07 DIAGNOSIS — R7309 Other abnormal glucose: Secondary | ICD-10-CM | POA: Diagnosis not present

## 2015-12-07 DIAGNOSIS — R945 Abnormal results of liver function studies: Secondary | ICD-10-CM

## 2015-12-07 LAB — CBC WITH DIFFERENTIAL/PLATELET
BASOS ABS: 0 10*3/uL (ref 0.0–0.1)
BASOS PCT: 0 % (ref 0–1)
Eosinophils Absolute: 0.2 10*3/uL (ref 0.0–0.7)
Eosinophils Relative: 3 % (ref 0–5)
HCT: 43.2 % (ref 39.0–52.0)
HEMOGLOBIN: 15.3 g/dL (ref 13.0–17.0)
Lymphocytes Relative: 46 % (ref 12–46)
Lymphs Abs: 2.7 10*3/uL (ref 0.7–4.0)
MCH: 31.2 pg (ref 26.0–34.0)
MCHC: 35.4 g/dL (ref 30.0–36.0)
MCV: 88.2 fL (ref 78.0–100.0)
MONOS PCT: 12 % (ref 3–12)
MPV: 8.1 fL — AB (ref 8.6–12.4)
Monocytes Absolute: 0.7 10*3/uL (ref 0.1–1.0)
NEUTROS ABS: 2.3 10*3/uL (ref 1.7–7.7)
NEUTROS PCT: 39 % — AB (ref 43–77)
PLATELETS: 241 10*3/uL (ref 150–400)
RBC: 4.9 MIL/uL (ref 4.22–5.81)
RDW: 14 % (ref 11.5–15.5)
WBC: 5.8 10*3/uL (ref 4.0–10.5)

## 2015-12-07 LAB — COMPLETE METABOLIC PANEL WITH GFR
ALBUMIN: 4 g/dL (ref 3.6–5.1)
ALK PHOS: 68 U/L (ref 40–115)
ALT: 43 U/L (ref 9–46)
AST: 29 U/L (ref 10–35)
BILIRUBIN TOTAL: 0.5 mg/dL (ref 0.2–1.2)
BUN: 24 mg/dL (ref 7–25)
CO2: 27 mmol/L (ref 20–31)
Calcium: 9.4 mg/dL (ref 8.6–10.3)
Chloride: 99 mmol/L (ref 98–110)
Creat: 1.09 mg/dL (ref 0.70–1.25)
GFR, Est African American: 81 mL/min (ref >=60)
GFR, Est Non African American: 70 mL/min (ref >=60)
GLUCOSE: 214 mg/dL — AB (ref 65–99)
Potassium: 4 mmol/L (ref 3.5–5.3)
SODIUM: 134 mmol/L — AB (ref 135–146)
TOTAL PROTEIN: 7.3 g/dL (ref 6.1–8.1)

## 2015-12-07 NOTE — Progress Notes (Signed)
By signing my name below, I, [name], attest that this documentation has been prepared under the direction and in the presence of [name], MD.  Electronically Signed: Thea Alken, ED Scribe. 12/07/2015. 10:36 AM.  Chief Complaint:  Chief Complaint  Patient presents with  . Follow-up  . Tick borne disease  . abdominal pain    "still there a little bit"   HPI: [name] is a 69 y.o. male who reports to [facility] today follow up. Pt was hospitalized at [facility] over the summer for a suspected tick borne disease after presenting with fever and abdominal pain. 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  He has been on multiple rounds of doxycycline with improvement to symptoms. Today, pt states he feels 90% better. He rates his abdominal pain 3/10. He is gaining his strength back and has started back at the gym. He has a f/u appointment with urology in January.    Lab Results  Component Value Date   HGBA1C 6.5* 10/26/2015     Past Medical History  Diagnosis Date  . Hypertension   . Anxiety   . Pneumonia   . RAD (reactive airway disease)   . Allergic angioedema   . Atrial flutter [facility])    Past Surgical History  Procedure Laterality Date  . Appendectomy    . Tonsillectomy    . Colonoscopy    . Cholecystectomy    . Stress myoview  12/13/2008    EF 61% LV  normal   Social History   Social History  . Marital Status: Married    Spouse Name: N/A  . Number of Children: N/A  . Years of Education: N/A   Social History Main Topics  . Smoking status: Former Smoker    Types: Cigarettes, Pipe    Quit date: 10/05/1996  . Smokeless tobacco: Former Systems developer    Types: Chew     Comment: Occasionally  . Alcohol Use: No  . Drug Use: No  . Sexual Activity: Yes    Birth Control/ Protection: None   Other Topics Concern  . None   Social History Narrative   Family History  Problem Relation Age of Onset  .  Pancreatic cancer Mother   . Hypertension Mother   . Cancer Mother   . Hypertension Sister   . Asthma Son   . Stroke Father   . Hypertension Father   . Hypertension Brother   . Hypertension Daughter   . Cancer Maternal Grandfather    Allergies  Allergen Reactions  . Ibuprofen Swelling  . Lisinopril Swelling    Swelling of face and lips   . Oxaprozin Swelling    Swelling of face and lips  . Ziac [Bisoprolol-Hydrochlorothiazide] Swelling    Per patient records from Dr. Florina Ou.  Thayer Jew Hcl] Itching  . Hctz [Hydrochlorothiazide]   . Norvasc [Amlodipine Besylate]    Prior to Admission medications   Medication Sig Start Date End Date Taking? Authorizing Provider  capsaicin (ZOSTRIX) 0.025 % cream Apply Twice daily. 11/01/15  Yes Manus Gunning, MD  cetirizine (ZYRTEC) 10 MG tablet Take 10 mg by mouth daily.   Yes Historical Provider, MD  chlorthalidone (HYGROTON) 25 MG tablet Take 25 mg by mouth daily.   Yes Historical Provider, MD  chlorthalidone (HYGROTON) 25 MG tablet TAKE 1 TABLET BY MOUTH EVERY DAY 11/21/15  Yes Pixie Casino, MD  diltiazem (CARDIZEM CD) 300 MG  24 hr capsule Take 1 capsule (300 mg total) by mouth daily. 02/16/15  Yes Pixie Casino, MD  EPINEPHrine (EPI-PEN) 0.3 mg/0.3 mL DEVI Inject 0.3 mLs (0.3 mg total) into the muscle once. 10/19/12  Yes Eleanore Kurtis Bushman, PA-C  metoprolol succinate (TOPROL-XL) 100 MG 24 hr tablet Take 100 mg by mouth daily. Take with or immediately following a meal.   Yes Historical Provider, MD     ROS: The patient denies fevers, chills, night sweats, unintentional weight loss, chest pain, palpitations, wheezing, dyspnea on exertion, nausea, vomiting, abdominal pain, dysuria, hematuria, melena, numbness, weakness, or tingling.   All other systems have been reviewed and were otherwise negative with the exception of those mentioned in the HPI and as above.    PHYSICAL EXAM: Filed Vitals:   12/07/15 1028  BP:  120/64  Pulse: 75  Temp: 98.7 F (37.1 C)  Resp: 16   Body mass index is 34.47 kg/(m^2).   General: Alert, no acute distress HEENT:  Normocephalic, atraumatic, oropharynx patent. Eye: Juliette Mangle [facility] Cardiovascular:  Regular rate and rhythm, no rubs murmurs or gallops.  No Carotid bruits, radial pulse intact. No pedal edema.  Respiratory: Clear to auscultation bilaterally.  No wheezes, rales, or rhonchi.  No cyanosis, no use of accessory musculature Abdominal: No organomegaly, abdomen is soft and non-tender, positive bowel sounds.  No masses. Musculoskeletal: Gait intact. No edema, tenderness Skin: No rashes. Neurologic: Facial musculature symmetric. Psychiatric: Patient acts appropriately throughout our interaction. Lymphatic: No cervical or submandibular lymphadenopathy   LABS: Results for orders placed or performed in visit on 10/26/15  CBC with Differential/Platelet  Result Value Ref Range   WBC 5.6 4.0 - 10.5 K/uL   RBC 4.83 4.22 - 5.81 MIL/uL   Hemoglobin 15.1 13.0 - 17.0 g/dL   HCT 43.3 39.0 - 52.0 %   MCV 89.6 78.0 - 100.0 fL   MCH 31.3 26.0 - 34.0 pg   MCHC 34.9 30.0 - 36.0 g/dL   RDW 14.1 11.5 - 15.5 %   Platelets 254 150 - 400 K/uL   MPV 8.6 8.6 - 12.4 fL   Neutrophils Relative % 40 (L) 43 - 77 %   Neutro Abs 2.2 1.7 - 7.7 K/uL   Lymphocytes Relative 49 (H) 12 - 46 %   Lymphs Abs 2.7 0.7 - 4.0 K/uL   Monocytes Relative 10 3 - 12 %   Monocytes Absolute 0.6 0.1 - 1.0 K/uL   Eosinophils Relative 1 0 - 5 %   Eosinophils Absolute 0.1 0.0 - 0.7 K/uL   Basophils Relative 0 0 - 1 %   Basophils Absolute 0.0 0.0 - 0.1 K/uL   Smear Review Criteria for review not met   Hepatic function panel  Result Value Ref Range   Total Bilirubin 0.7 0.2 - 1.2 mg/dL   Bilirubin, Direct 0.2 <=0.2 mg/dL   Indirect Bilirubin 0.5 0.2 - 1.2 mg/dL   Alkaline Phosphatase 109 40 - 115 U/L   AST 21 10 - 35 U/L   ALT 26 9 - 46 U/L   Total Protein 7.6 6.1 - 8.1 g/dL   Albumin 4.6 3.6 -  5.1 g/dL  Sedimentation rate  Result Value Ref Range   Sed Rate 24 (H) 0 - 20 mm/hr  Hemoglobin A1c  Result Value Ref Range   Hgb A1c MFr Bld 6.5 (H) <5.7 %   Mean Plasma Glucose 140 (H) <117 mg/dL     EKG/XRAY:   Primary read interpreted by Dr. Everlene Farrier  at [facility].   ASSESSMENT/PLAN: I did go ahead and repeat his CBC and see Matt. He feels like he is 90% back to normal. It appears he had concomitant illnesses with CMV and a tick borne disease. He continues to have right lower quadrant abdominal pain 3 out of 10. 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 We'll recheck the patient in 3 months to follow-up on his diabetes.I personally performed the services described in this documentation, which was scribed in my presence. The recorded information has been reviewed and is accurate.   Gross sideeffects, risk and benefits, and alternatives of medications d/w patient. Patient is aware that all medications have potential sideeffects and we are unable to predict every sideeffect or drug-drug interaction that may occur.  [name] MD 12/07/2015 10:33 AM

## 2015-12-13 DIAGNOSIS — L821 Other seborrheic keratosis: Secondary | ICD-10-CM | POA: Diagnosis not present

## 2015-12-13 DIAGNOSIS — Z85828 Personal history of other malignant neoplasm of skin: Secondary | ICD-10-CM | POA: Diagnosis not present

## 2015-12-13 DIAGNOSIS — L570 Actinic keratosis: Secondary | ICD-10-CM | POA: Diagnosis not present

## 2015-12-13 DIAGNOSIS — D1801 Hemangioma of skin and subcutaneous tissue: Secondary | ICD-10-CM | POA: Diagnosis not present

## 2015-12-13 DIAGNOSIS — D3617 Benign neoplasm of peripheral nerves and autonomic nervous system of trunk, unspecified: Secondary | ICD-10-CM | POA: Diagnosis not present

## 2015-12-13 DIAGNOSIS — L723 Sebaceous cyst: Secondary | ICD-10-CM | POA: Diagnosis not present

## 2015-12-24 ENCOUNTER — Other Ambulatory Visit: Payer: Self-pay | Admitting: Internal Medicine

## 2015-12-25 ENCOUNTER — Other Ambulatory Visit: Payer: Self-pay

## 2015-12-26 ENCOUNTER — Other Ambulatory Visit: Payer: Self-pay | Admitting: Internal Medicine

## 2015-12-26 NOTE — Telephone Encounter (Signed)
Rx request sent to pharmacy.  

## 2015-12-28 DIAGNOSIS — N281 Cyst of kidney, acquired: Secondary | ICD-10-CM | POA: Diagnosis not present

## 2015-12-28 DIAGNOSIS — Z0000 Encounter for general adult medical examination without abnormal findings: Secondary | ICD-10-CM | POA: Diagnosis not present

## 2016-01-02 ENCOUNTER — Ambulatory Visit: Payer: Medicare HMO

## 2016-01-29 DIAGNOSIS — R69 Illness, unspecified: Secondary | ICD-10-CM | POA: Diagnosis not present

## 2016-02-20 ENCOUNTER — Ambulatory Visit (INDEPENDENT_AMBULATORY_CARE_PROVIDER_SITE_OTHER): Payer: Medicare HMO | Admitting: Family Medicine

## 2016-02-20 ENCOUNTER — Encounter: Payer: Self-pay | Admitting: Family Medicine

## 2016-02-20 VITALS — BP 156/74 | HR 85 | Ht 73.0 in | Wt 250.0 lb

## 2016-02-20 DIAGNOSIS — M25552 Pain in left hip: Secondary | ICD-10-CM

## 2016-02-20 MED ORDER — HYDROCODONE-ACETAMINOPHEN 5-325 MG PO TABS: 1.0000 | ORAL_TABLET | Freq: Four times a day (QID) | ORAL | Status: DC | PRN

## 2016-02-20 MED ORDER — DICLOFENAC SODIUM 75 MG PO TBEC: 75.0000 mg | DELAYED_RELEASE_TABLET | Freq: Two times a day (BID) | ORAL | Status: DC

## 2016-02-20 NOTE — Patient Instructions (Addendum)
Your primary issue is a severe hip flexor strain. Icing 15 minutes at a time 3-4 times a day. Crutches as needed for the first 5-7 days. Voltaren twice a day with food for pain and inflammation. Norco as needed for severe pain - no driving on this medicine. Compression shorts typically help as well - wear as often as possible. Follow up with me in 1-2 weeks (2 weeks at latest - up to 1 week from now if you're doing very well).

## 2016-02-21 ENCOUNTER — Telehealth: Payer: Self-pay | Admitting: Family Medicine

## 2016-02-21 MED ORDER — DICLOFENAC SODIUM 75 MG PO TBEC: 75.0000 mg | DELAYED_RELEASE_TABLET | Freq: Two times a day (BID) | ORAL | Status: DC

## 2016-02-21 NOTE — Telephone Encounter (Signed)
Sent in [facility] to the [facility] [address].

## 2016-02-21 NOTE — Telephone Encounter (Signed)
Spoke to patient and told him that his prescription was [facility] on [address].

## 2016-02-25 DIAGNOSIS — M25552 Pain in left hip: Secondary | ICD-10-CM | POA: Insufficient documentation

## 2016-02-25 NOTE — Assessment & Plan Note (Signed)
no acute injury.  Exam reassuring this is not an intraarticular issue.  Consistent with severe hip flexor strain.  Icing, crutches.  Voltaren with norco as needed.  Compression shorts.  F/u in 1-2 weeks for reevaluation.  Prior CT that included hip from last year without abnormalities.

## 2016-02-25 NOTE — Progress Notes (Signed)
PCP: Jared Reichmann, MD  Subjective:   HPI: Patient is a 70 y.o. male here for left hip pain.  Patient reports about 2 days ago he started to get left groin pain. Started following a workout in which he did high weight (450 lb) squats. Had some soreness prior to this but dramatically worsened since then. Took tylenol, iced this. Pain radiates to knee, buttocks, sharp. Difficulty bearing weight. Pain level 10/10. No acute injury however. No numbness or tingling. No skin changes, fever, other complaints.  Past Medical History  Diagnosis Date  . Hypertension   . Anxiety   . Pneumonia   . RAD (reactive airway disease)   . Allergic angioedema   . Atrial flutter [facility])     Current Outpatient Prescriptions on File Prior to Visit  Medication Sig Dispense Refill  . capsaicin (ZOSTRIX) 0.025 % cream Apply Twice daily. 60 g 3  . cetirizine (ZYRTEC) 10 MG tablet Take 10 mg by mouth daily.    . chlorthalidone (HYGROTON) 25 MG tablet TAKE 1 TABLET BY MOUTH EVERY DAY 30 tablet 2  . diltiazem (CARDIZEM CD) 300 MG 24 hr capsule Take 1 capsule (300 mg total) by mouth daily. 30 capsule 11  . EPINEPHrine (EPI-PEN) 0.3 mg/0.3 mL DEVI Inject 0.3 mLs (0.3 mg total) into the muscle once. 1 Device 1  . metoprolol succinate (TOPROL-XL) 100 MG 24 hr tablet Take 100 mg by mouth daily. Take with or immediately following a meal.     No current facility-administered medications on file prior to visit.    Past Surgical History  Procedure Laterality Date  . Appendectomy    . Tonsillectomy    . Colonoscopy    . Cholecystectomy    . Stress myoview  12/13/2008    EF 61% LV  normal    Allergies  Allergen Reactions  . Ibuprofen Swelling  . Lisinopril Swelling    Swelling of face and lips   . Oxaprozin Swelling    Swelling of face and lips  . Ziac [Bisoprolol-Hydrochlorothiazide] Swelling    Per patient records from Dr. Florina [name].  [name]] Itching  . Hctz  [Hydrochlorothiazide]   . Norvasc [Amlodipine Besylate]     Social History   Social History  . Marital Status: Married    Spouse Name: N/A  . Number of Children: N/A  . Years of Education: N/A   Occupational History  . Not on file.   Social History Main Topics  . Smoking status: Former Smoker    Types: Cigarettes, Pipe    Quit date: 10/05/1996  . Smokeless tobacco: Former Systems developer    Types: Chew     Comment: Occasionally  . Alcohol Use: No  . Drug Use: No  . Sexual Activity: Yes    Birth Control/ Protection: None   Other Topics Concern  . Not on file   Social History Narrative    Family History  Problem Relation Age of Onset  . Pancreatic cancer Mother   . Hypertension Mother   . Cancer Mother   . Hypertension Sister   . Asthma Son   . Stroke Father   . Hypertension Father   . Hypertension Brother   . Hypertension Daughter   . Cancer Maternal Grandfather     BP 156/74 mmHg  Pulse 85  Ht 6\' 1"  (1.854 m)  Wt 250 lb (113.399 kg)  BMI 32.99 kg/m2  Review of Systems: See HPI above.    Objective:  Physical Exam:  Gen: NAD, comfortable in exam room  Back: No gross deformity, scoliosis. No TTP .  No midline or bony TTP. FROM. Strength LEs 5/5 all muscle groups except those noted below.   2+ MSRs in patellar and achilles tendons, equal bilaterally. Negative SLRs. Sensation intact to light touch bilaterally.  Left hip: No gross deformity, swelling, bruising. TTP anteriorly over hip joint. Pain with straight leg raise and hip flexion - both 3/5 strength Negative logroll bilateral hips Negative fabers and piriformis stretches.    Assessment & Plan:  1. Left hip pain - no acute injury.  Exam reassuring this is not an intraarticular issue.  Consistent with severe hip flexor strain.  Icing, crutches.  Voltaren with norco as needed.  Compression shorts.  F/u in 1-2 weeks for reevaluation.  Prior CT that included hip from last year without abnormalities. '

## 2016-03-06 ENCOUNTER — Ambulatory Visit: Payer: Medicare HMO | Admitting: Family Medicine

## 2016-03-06 ENCOUNTER — Other Ambulatory Visit: Payer: Self-pay | Admitting: Internal Medicine

## 2016-03-06 NOTE — Telephone Encounter (Signed)
Rx(s) sent to pharmacy electronically. OV 03/07/16

## 2016-03-07 ENCOUNTER — Encounter: Payer: Self-pay | Admitting: Internal Medicine

## 2016-03-07 ENCOUNTER — Encounter: Payer: Self-pay | Admitting: Gastroenterology

## 2016-03-07 ENCOUNTER — Ambulatory Visit (INDEPENDENT_AMBULATORY_CARE_PROVIDER_SITE_OTHER): Payer: Medicare HMO | Admitting: Internal Medicine

## 2016-03-07 VITALS — BP 160/70 | HR 78 | Ht 73.0 in | Wt 265.2 lb

## 2016-03-07 DIAGNOSIS — I10 Essential (primary) hypertension: Secondary | ICD-10-CM | POA: Diagnosis not present

## 2016-03-07 DIAGNOSIS — I4892 Unspecified atrial flutter: Secondary | ICD-10-CM | POA: Diagnosis not present

## 2016-03-07 DIAGNOSIS — E786 Lipoprotein deficiency: Secondary | ICD-10-CM | POA: Diagnosis not present

## 2016-03-07 NOTE — Progress Notes (Signed)
OFFICE NOTE  Chief Complaint:  Follow-up atrial flutter  Primary Care Physician: Jenny Reichmann, MD  HPI:  [name] is a 70 year old gentleman with a history of atrial flutter which is paroxysmal. He was discharged on Xarelto and had an outpatient cardioversion, was found to be in sinus spontaneously. He did feel much better after he converted and was very aware of his atrial flutter. He was, however, on diltiazem despite the fact that he went into his abnormal rhythm and so I added a low-dose beta blocker which seems to have controlled his rate and blood pressure better. He does feel a little more tired easily which he contributes possibly to the medicine, however, his wife noted that he was a little more depressed. I am not clear if this is related to the beta blocker or other factors. He has had no recurrence of atrial flutter or fibrillation that I can tell and was taken off Xarelto. His CHADS2 score is 1, and I feel like he is at low risk given his age of 58. With the low burden of atrial fibrillation it is very reasonable.  He reports no further reoccurrence of atrial fibrillation or flutter. Recently he's been having problems with high blood pressure and notes that he has several intolerances and/or serious allergies to medications, including angioedema to ACE inhibitors and ARB medications.  He apparently has an allergy to hydrochlorothiazide, but does not recall whether that was rash or not.  [name] returns today for follow-up. He is without complaint. He continues to do both aerobic and resistance exercise without any limitations. In fact she's put on a lot of muscle mass. He denies any chest pain or shortness of breath. He's had no recurrent palpitations or atrial fibrillation or atrial flutter. He has well-controlled blood pressure. He denies any edema on the chlorthalidone which was started at his last office visit.  [name] returns today for follow-up of atrial  flutter. Unfortunately over the past year he suffered with what sounds like a CMV infection and possibly had another tickborne illness which was treated with doxycycline at [facility]. He also was seen by Dr. Linus Salmons with infectious diseases and Dr. Havery Moros with gastroenterology for elevated LFTs and right upper quadrant pain. He reports that is significantly improved and he is about "90% better". He denies any recurrent atrial flutter. He has occasional palpitations. He is not regularly taking aspirin.  PMHx:  Past Medical History  Diagnosis Date  . Hypertension   . Anxiety   . Pneumonia   . RAD (reactive airway disease)   . Allergic angioedema   . Atrial flutter [facility])     Past Surgical History  Procedure Laterality Date  . Appendectomy    . Tonsillectomy    . Colonoscopy    . Cholecystectomy    . Stress myoview  12/13/2008    EF 61% LV  normal    FAMHx:  Family History  Problem Relation Age of Onset  . Pancreatic cancer Mother   . Hypertension Mother   . Cancer Mother   . Hypertension Sister   . Asthma Son   . Stroke Father   . Hypertension Father   . Hypertension Brother   . Hypertension Daughter   . Cancer Maternal Grandfather     SOCHx:   reports that he quit smoking about 19 years ago. His smoking use included Cigarettes and Pipe. He has quit using smokeless tobacco. His smokeless tobacco use included Chew.  He reports that he does not drink alcohol or use illicit drugs.  ALLERGIES:  Allergies  Allergen Reactions  . Ibuprofen Swelling  . Lisinopril Swelling    Swelling of face and lips   . Oxaprozin Swelling    Swelling of face and lips  . Ziac [Bisoprolol-Hydrochlorothiazide] Swelling    Per patient records from Dr. Florina Ou.  Thayer Jew Hcl] Itching  . Hctz [Hydrochlorothiazide]   . Norvasc [Amlodipine Besylate]     ROS: Pertinent items noted in HPI and remainder of comprehensive ROS otherwise negative.  HOME  MEDS: Current Outpatient Prescriptions  Medication Sig Dispense Refill  . cetirizine (ZYRTEC) 10 MG tablet Take 10 mg by mouth daily.    . chlorthalidone (HYGROTON) 25 MG tablet TAKE 1 TABLET BY MOUTH EVERY DAY 30 tablet 2  . diltiazem (CARDIZEM CD) 300 MG 24 hr capsule TAKE 1 CAPSULE (300 MG TOTAL) BY MOUTH DAILY. 30 capsule 0  . EPINEPHrine (EPI-PEN) 0.3 mg/0.3 mL DEVI Inject 0.3 mLs (0.3 mg total) into the muscle once. 1 Device 1  . metoprolol succinate (TOPROL-XL) 100 MG 24 hr tablet Take 100 mg by mouth daily. Take with or immediately following a meal.     No current facility-administered medications for this visit.    LABS/IMAGING: No results found for this or any previous visit (from the past 48 hour(s)). No results found.  VITALS: BP 160/70 mmHg  Ht 6\' 1"  (1.854 m)  Wt 265 lb 3.2 oz (120.294 kg)  BMI 35.00 kg/m2  EXAM: General appearance: alert and no distress Neck: no adenopathy, no carotid bruit, no JVD, supple, symmetrical, trachea midline and thyroid not enlarged, symmetric, no tenderness/mass/nodules Lungs: clear to auscultation bilaterally Heart: regular rate and rhythm, S1, S2 normal, no murmur, click, rub or gallop Abdomen: soft, non-tender; bowel sounds normal; no masses,  no organomegaly Extremities: edema trace edema Pulses: 2+ and symmetric Skin: Skin color, texture, turgor normal. No rashes or lesions Neurologic: Grossly normal  EKG: Normal sinus rhythm at 78  ASSESSMENT: 1. Hypertension - controlled 2. Paroxysmal atrial flutter without recurrence, low CHADSVASC score of 1  PLAN: 1.   [name] has had no real recurrence of atrial flutter in the past 3 years. He was very symptomatic with it and therefore could identify whether he is having it or not. He has since discontinued aspirin and I don't see a clear indication for that at this time. Should he has some reoccurrence it would be reasonable to retreat him with aspirin. He does have hypertension  however that is now well controlled. A recheck of blood pressure today was 124/70. He can follow-up with me as needed.  Pixie Casino, MD, [facility] Attending Cardiologist [place] [facility] 03/07/2016, 8:29 AM

## 2016-03-07 NOTE — Patient Instructions (Signed)
No change in current medications  Your physician recommends that you schedule a follow-up appointment as needed basis   Have medication refill by your primary

## 2016-03-10 ENCOUNTER — Telehealth: Payer: Self-pay

## 2016-03-10 ENCOUNTER — Encounter: Payer: Self-pay | Admitting: Family Medicine

## 2016-03-10 ENCOUNTER — Ambulatory Visit (INDEPENDENT_AMBULATORY_CARE_PROVIDER_SITE_OTHER): Payer: Medicare HMO | Admitting: Family Medicine

## 2016-03-10 VITALS — BP 126/77 | HR 72 | Ht 73.0 in | Wt 255.0 lb

## 2016-03-10 DIAGNOSIS — M25552 Pain in left hip: Secondary | ICD-10-CM

## 2016-03-10 NOTE — Telephone Encounter (Signed)
Pt is scheduled for Medicare-CPE 4/06/017 would like it moved into May (in the am please)//plesase call to consult..  [phone]

## 2016-03-10 NOTE — Progress Notes (Signed)
PCP: Jenny Reichmann, MD  Subjective:   HPI: Patient is a 70 y.o. male here for left hip pain.  3/8: Patient reports about 2 days ago he started to get left groin pain. Started following a workout in which he did high weight (450 lb) squats. Had some soreness prior to this but dramatically worsened since then. Took tylenol, iced this. Pain radiates to knee, buttocks, sharp. Difficulty bearing weight. Pain level 10/10. No acute injury however. No numbness or tingling. No skin changes, fever, other complaints.  3/27: Patient reports he is doing better. Pain level up to 3/10 at worst now. Bothers him when getting in and out of the car. Used crutches only first day. Has been icing. Norco only for 1 1/2 days. Doing some stretching. No skin changes, fever.  Past Medical History  Diagnosis Date  . Hypertension   . Anxiety   . Pneumonia   . RAD (reactive airway disease)   . Allergic angioedema   . Atrial flutter [facility])     Current Outpatient Prescriptions on File Prior to Visit  Medication Sig Dispense Refill  . cetirizine (ZYRTEC) 10 MG tablet Take 10 mg by mouth daily.    . chlorthalidone (HYGROTON) 25 MG tablet TAKE 1 TABLET BY MOUTH EVERY DAY 30 tablet 2  . diltiazem (CARDIZEM CD) 300 MG 24 hr capsule TAKE 1 CAPSULE (300 MG TOTAL) BY MOUTH DAILY. 30 capsule 0  . EPINEPHrine (EPI-PEN) 0.3 mg/0.3 mL DEVI Inject 0.3 mLs (0.3 mg total) into the muscle once. 1 Device 1  . metoprolol succinate (TOPROL-XL) 100 MG 24 hr tablet Take 100 mg by mouth daily. Take with or immediately following a meal.     No current facility-administered medications on file prior to visit.    Past Surgical History  Procedure Laterality Date  . Appendectomy    . Tonsillectomy    . Colonoscopy    . Cholecystectomy    . Stress myoview  12/13/2008    EF 61% LV  normal    Allergies  Allergen Reactions  . Ibuprofen Swelling  . Lisinopril Swelling    Swelling of face and lips   . Oxaprozin  Swelling    Swelling of face and lips  . Ziac [Bisoprolol-Hydrochlorothiazide] Swelling    Per patient records from Dr. Florina Ou.  Thayer Jew Hcl] Itching  . Hctz [Hydrochlorothiazide]   . Norvasc [Amlodipine Besylate]     Social History   Social History  . Marital Status: Married    Spouse Name: N/A  . Number of Children: N/A  . Years of Education: N/A   Occupational History  . Not on file.   Social History Main Topics  . Smoking status: Former Smoker    Types: Cigarettes, Pipe    Quit date: 10/05/1996  . Smokeless tobacco: Former Systems developer    Types: Chew     Comment: Occasionally  . Alcohol Use: No  . Drug Use: No  . Sexual Activity: Yes    Birth Control/ Protection: None   Other Topics Concern  . Not on file   Social History Narrative    Family History  Problem Relation Age of Onset  . Pancreatic cancer Mother   . Hypertension Mother   . Cancer Mother   . Hypertension Sister   . Asthma Son   . Stroke Father   . Hypertension Father   . Hypertension Brother   . Hypertension Daughter   . Cancer Maternal Grandfather     BP  126/77 mmHg  Pulse 72  Ht 6\' 1"  (1.854 m)  Wt 255 lb (115.667 kg)  BMI 33.65 kg/m2  Review of Systems: See HPI above.    Objective:  Physical Exam:  Gen: NAD, comfortable in exam room  Left hip: No gross deformity, swelling, bruising. No TTP anteriorly over hip joint. 5/5 strength straight leg raise and hip flexion with minimal pain. Negative logroll bilateral hips.    Assessment & Plan:  1. Left hip pain - no acute injury.  Consistent with severe hip flexor strain.  Much improved.  Shown home exercises to do daily and discussed slow improvement back to full squats and lunged.  Consider physical therapy if he struggles.  Follow up as needed.  Tylenol, nsaids, icing only if needed now.

## 2016-03-10 NOTE — Patient Instructions (Signed)
Straight leg raises, standing hip rotations 3 sets of 10 once a day.  Add ankle weights if these are too easy. Wait about 1-2 weeks then can do half-squats and half lunges. Wait 1-2 weeks after this to add full squats and full lunges. When you return to the gym to do squats and lunges do about 50% what you normally would do and increase by 10% per week to your goal. Call me if you have any problems otherwise follow up as needed.

## 2016-03-10 NOTE — Assessment & Plan Note (Signed)
no acute injury.  Consistent with severe hip flexor strain.  Much improved.  Shown home exercises to do daily and discussed slow improvement back to full squats and lunged.  Consider physical therapy if he struggles.  Follow up as needed.  Tylenol, nsaids, icing only if needed now.

## 2016-03-19 NOTE — Telephone Encounter (Signed)
There are no available Medicare physical slots open for Dr. Everlene Farrier.  I will talk to Dr. Everlene Farrier to determine a path forward and call patient.

## 2016-03-20 ENCOUNTER — Ambulatory Visit (INDEPENDENT_AMBULATORY_CARE_PROVIDER_SITE_OTHER): Payer: Medicare HMO | Admitting: Emergency Medicine

## 2016-03-20 ENCOUNTER — Encounter: Payer: Self-pay | Admitting: Emergency Medicine

## 2016-03-20 VITALS — BP 118/70 | HR 72 | Temp 98.0°F | Resp 16 | Ht 73.5 in | Wt 261.6 lb

## 2016-03-20 DIAGNOSIS — R739 Hyperglycemia, unspecified: Secondary | ICD-10-CM

## 2016-03-20 DIAGNOSIS — Z125 Encounter for screening for malignant neoplasm of prostate: Secondary | ICD-10-CM | POA: Diagnosis not present

## 2016-03-20 DIAGNOSIS — Z13220 Encounter for screening for lipoid disorders: Secondary | ICD-10-CM

## 2016-03-20 DIAGNOSIS — D751 Secondary polycythemia: Secondary | ICD-10-CM | POA: Diagnosis not present

## 2016-03-20 DIAGNOSIS — Z0000 Encounter for general adult medical examination without abnormal findings: Secondary | ICD-10-CM | POA: Diagnosis not present

## 2016-03-20 DIAGNOSIS — Z23 Encounter for immunization: Secondary | ICD-10-CM

## 2016-03-20 DIAGNOSIS — R7989 Other specified abnormal findings of blood chemistry: Secondary | ICD-10-CM | POA: Diagnosis not present

## 2016-03-20 DIAGNOSIS — R945 Abnormal results of liver function studies: Secondary | ICD-10-CM

## 2016-03-20 DIAGNOSIS — R799 Abnormal finding of blood chemistry, unspecified: Secondary | ICD-10-CM | POA: Diagnosis not present

## 2016-03-20 LAB — POCT URINALYSIS DIP (MANUAL ENTRY)
BILIRUBIN UA: NEGATIVE
BILIRUBIN UA: NEGATIVE
Glucose, UA: NEGATIVE
Leukocytes, UA: NEGATIVE
Nitrite, UA: NEGATIVE
PH UA: 6
PROTEIN UA: NEGATIVE
SPEC GRAV UA: 1.015
Urobilinogen, UA: 0.2

## 2016-03-20 LAB — CBC WITH DIFFERENTIAL/PLATELET
BASOS PCT: 0 %
Basophils Absolute: 0 cells/uL (ref 0–200)
EOS ABS: 55 {cells}/uL (ref 15–500)
Eosinophils Relative: 1 %
HEMATOCRIT: 41.2 % (ref 38.5–50.0)
Hemoglobin: 14.6 g/dL (ref 13.2–17.1)
LYMPHS ABS: 2090 {cells}/uL (ref 850–3900)
LYMPHS PCT: 38 %
MCH: 31.1 pg (ref 27.0–33.0)
MCHC: 35.4 g/dL (ref 32.0–36.0)
MCV: 87.7 fL (ref 80.0–100.0)
MONO ABS: 605 {cells}/uL (ref 200–950)
MPV: 8 fL (ref 7.5–12.5)
Monocytes Relative: 11 %
NEUTROS ABS: 2750 {cells}/uL (ref 1500–7800)
Neutrophils Relative %: 50 %
Platelets: 278 10*3/uL (ref 140–400)
RBC: 4.7 MIL/uL (ref 4.20–5.80)
RDW: 14.3 % (ref 11.0–15.0)
WBC: 5.5 10*3/uL (ref 3.8–10.8)

## 2016-03-20 LAB — COMPLETE METABOLIC PANEL WITH GFR
ALBUMIN: 3.8 g/dL (ref 3.6–5.1)
ALK PHOS: 55 U/L (ref 40–115)
ALT: 31 U/L (ref 9–46)
AST: 23 U/L (ref 10–35)
BILIRUBIN TOTAL: 0.5 mg/dL (ref 0.2–1.2)
BUN: 24 mg/dL (ref 7–25)
CALCIUM: 9.2 mg/dL (ref 8.6–10.3)
CO2: 26 mmol/L (ref 20–31)
CREATININE: 0.98 mg/dL (ref 0.70–1.25)
Chloride: 102 mmol/L (ref 98–110)
GFR, Est African American: >89 mL/min (ref >=60)
GFR, Est Non African American: 79 mL/min (ref >=60)
GLUCOSE: 148 mg/dL — AB (ref 65–99)
POTASSIUM: 4 mmol/L (ref 3.5–5.3)
SODIUM: 137 mmol/L (ref 135–146)
TOTAL PROTEIN: 7.4 g/dL (ref 6.1–8.1)

## 2016-03-20 LAB — LIPID PANEL
CHOLESTEROL: 154 mg/dL (ref 125–200)
HDL: 31 mg/dL — ABNORMAL LOW (ref >=40)
LDL Cholesterol: 101 mg/dL (ref <130)
Total CHOL/HDL Ratio: 5 Ratio (ref <=5.0)
Triglycerides: 112 mg/dL (ref <150)
VLDL: 22 mg/dL (ref <30)

## 2016-03-20 LAB — POC MICROSCOPIC URINALYSIS (UMFC): Mucus: ABSENT

## 2016-03-20 LAB — GLUCOSE, POCT (MANUAL RESULT ENTRY): POC GLUCOSE: 177 mg/dL — AB (ref 70–99)

## 2016-03-20 LAB — POCT GLYCOSYLATED HEMOGLOBIN (HGB A1C): HEMOGLOBIN A1C: 6.7

## 2016-03-20 MED ORDER — METFORMIN HCL ER 500 MG PO TB24: 500.0000 mg | ORAL_TABLET | Freq: Every day | ORAL | Status: DC

## 2016-03-20 NOTE — Addendum Note (Signed)
Addended by: Alfredia Ferguson A on: 03/20/2016 04:11 PM   Modules accepted: Orders, SmartSet

## 2016-03-20 NOTE — Addendum Note (Signed)
Addended by: Alfredia Ferguson A on: 03/20/2016 01:57 PM   Modules accepted: Miquel Dunn

## 2016-03-20 NOTE — Progress Notes (Signed)
Patient ID: [name], male   DOB: Feb 14, 1946, 70 y.o.   MRN: [id number]    By signing my name below, I, Essence Howell, attest that this documentation has been prepared under the direction and in the presence of [name], MD Electronically Signed: Ladene Artist, ED Scribe 03/20/2016 at 9:12 AM  Chief Complaint:  Chief Complaint  Patient presents with  . Annual Exam  . Medication Refill    chlorthalidone   HPI: [name] is a 70 y.o. male who reports to [facility] today for an annual exam. Pt states that he is doing well overall.   Medication Refill Pt requests a medication refill of chlorthalidone at this visit.   Cardiology Pt has a h/o atrial flutter. He was recently released from his cardiologist.   Preventative Maintenance Pt's colonoscopy is UTD.   Past Medical History  Diagnosis Date  . Hypertension   . Anxiety   . Pneumonia   . RAD (reactive airway disease)   . Allergic angioedema   . Atrial flutter [facility])    Past Surgical History  Procedure Laterality Date  . Appendectomy    . Tonsillectomy    . Colonoscopy    . Cholecystectomy    . Stress myoview  12/13/2008    EF 61% LV  normal   Social History   Social History  . Marital Status: Married    Spouse Name: N/A  . Number of Children: N/A  . Years of Education: N/A   Social History Main Topics  . Smoking status: Former Smoker    Types: Cigarettes, Pipe    Quit date: 10/05/1996  . Smokeless tobacco: Former Systems developer    Types: Chew     Comment: Occasionally  . Alcohol Use: No  . Drug Use: No  . Sexual Activity: Yes    Birth Control/ Protection: None   Other Topics Concern  . Not on file   Social History Narrative   Family History  Problem Relation Age of Onset  . Pancreatic cancer Mother   . Hypertension Mother   . Cancer Mother   . Hypertension Sister   . Asthma Son   . Stroke Father   . Hypertension Father   . Hypertension Brother   . Hypertension Daughter   . Cancer  Maternal Grandfather    Allergies  Allergen Reactions  . Ibuprofen Swelling  . Lisinopril Swelling    Swelling of face and lips   . Oxaprozin Swelling    Swelling of face and lips  . Ziac [Bisoprolol-Hydrochlorothiazide] Swelling    Per patient records from Dr. Florina Ou.  Thayer Jew Hcl] Itching  . Hctz [Hydrochlorothiazide]   . Norvasc [Amlodipine Besylate]    Prior to Admission medications   Medication Sig Start Date End Date Taking? Authorizing Provider  cetirizine (ZYRTEC) 10 MG tablet Take 10 mg by mouth daily.    Historical Provider, MD  chlorthalidone (HYGROTON) 25 MG tablet TAKE 1 TABLET BY MOUTH EVERY DAY 12/26/15   Pixie Casino, MD  diltiazem (CARDIZEM CD) 300 MG 24 hr capsule TAKE 1 CAPSULE (300 MG TOTAL) BY MOUTH DAILY. 03/06/16   Pixie Casino, MD  EPINEPHrine (EPI-PEN) 0.3 mg/0.3 mL DEVI Inject 0.3 mLs (0.3 mg total) into the muscle once. 10/19/12   Eleanore Kurtis Bushman, PA-C  metoprolol succinate (TOPROL-XL) 100 MG 24 hr tablet Take 100 mg by mouth daily. Take with or immediately following a meal.    Historical Provider, MD   ROS: The  patient denies fevers, chills, night sweats, unintentional weight loss, chest pain, palpitations, wheezing, dyspnea on exertion, nausea, vomiting, abdominal pain, dysuria, hematuria, melena, numbness, weakness, or tingling.   All other systems have been reviewed and were otherwise negative with the exception of those mentioned in the HPI and as above.    PHYSICAL EXAM: Filed Vitals:   03/20/16 0835  BP: 118/70  Pulse: 72  Temp: 98 F (36.7 C)  Resp: 16   Body mass index is 34.04 kg/(m^2).  General: Alert, no acute distress HEENT:  Normocephalic, atraumatic, oropharynx patent. Eye: Juliette Mangle [facility] Cardiovascular: Regular rate and rhythm, no rubs murmurs or gallops. No Carotid bruits, radial pulse intact. No pedal edema.  Respiratory: Clear to auscultation bilaterally. No wheezes, rales, or rhonchi. No cyanosis, no use of  accessory musculature Abdominal: Diastasis recti. No organomegaly, abdomen is soft and non-tender, positive bowel sounds. No masses.  Musculoskeletal: Gait intact. No edema, tenderness GU: Prostate is normal size.  Skin: No rashes. Neurologic: Facial musculature symmetric. Psychiatric: Patient acts appropriately throughout our interaction. Lymphatic: No cervical or submandibular lymphadenopathy  LABS: Results for orders placed or performed in visit on 03/20/16  POCT urinalysis dipstick  Result Value Ref Range   Color, UA yellow yellow   Clarity, UA clear clear   Glucose, UA negative negative   Bilirubin, UA negative negative   Ketones, POC UA negative negative   Spec Grav, UA 1.015    Blood, UA trace-lysed (A) negative   pH, UA 6.0    Protein Ur, POC negative negative   Urobilinogen, UA 0.2    Nitrite, UA Negative Negative   Leukocytes, UA Negative Negative  POCT Microscopic Urinalysis ([facility])  Result Value Ref Range   WBC,UR,HPF,POC None None WBC/hpf   RBC,UR,HPF,POC None None RBC/hpf   Bacteria None None, Too numerous to count   Mucus Absent Absent   Epithelial Cells, UR Per Microscopy None None, Too numerous to count cells/hpf  POCT glucose (manual entry)  Result Value Ref Range   POC Glucose 177 (A) 70 - 99 mg/dl  POCT glycosylated hemoglobin (Hb A1C)  Result Value Ref Range   Hemoglobin A1C 6.7     EKG/XRAY:   Primary read interpreted by Dr. Everlene Farrier at [facility].  ASSESSMENT/PLAN: We'll start Glucophage XR 500 one a day recheck hemoglobin A1c 3 months. His exam is otherwise normal. He will let us know if he starts to develop more problems with atrial fibrillation. He will recheck up the street in 3 months for follow-up A1c.I personally performed the services described in this documentation, which was scribed in my presence. The recorded information has been reviewed and is accurate.  Gross sideeffects, risk and benefits, and alternatives of medications d/w patient. Patient  is aware that all medications have potential sideeffects and we are unable to predict every sideeffect or drug-drug interaction that may occur.  [name] MD 03/20/2016 8:39 AM

## 2016-03-20 NOTE — Patient Instructions (Addendum)
IF you received an x-ray today, you will receive an invoice from [facility]. Please contact [facility] at [phone] with questions or concerns regarding your invoice.   IF you received labwork today, you will receive an invoice from [employer]. Please contact [facility] at [phone] with questions or concerns regarding your invoice.   Our billing staff will not be able to assist you with questions regarding bills from these companies.  You will be contacted with the lab results as soon as they are available. The fastest way to get your results is to activate your My Chart account. Instructions are located on the last page of this paperwork. If you have not heard from [place] regarding the results in 2 weeks, please contact this office.     diabetesDiabetes Mellitus and Food It is important for you to manage your blood sugar (glucose) level. Your blood glucose level can be greatly affected by what you eat. Eating healthier foods in the appropriate amounts throughout the day at about the same time each day will help you control your blood glucose level. It can also help slow or prevent worsening of your diabetes mellitus. Healthy eating may even help you improve the level of your blood pressure and reach or maintain a healthy weight.  General recommendations for healthful eating and cooking habits include:  Eating meals and snacks regularly. Avoid going long periods of time without eating to lose weight.  Eating a diet that consists mainly of plant-based foods, such as fruits, vegetables, nuts, legumes, and whole grains.  Using low-heat cooking methods, such as baking, instead of high-heat cooking methods, such as deep frying. Work with your dietitian to make sure you understand how to use the Nutrition Facts information on food labels. HOW CAN FOOD AFFECT ME? Carbohydrates Carbohydrates affect your blood glucose level more than any other type  of food. Your dietitian will help you determine how many carbohydrates to eat at each meal and teach you how to count carbohydrates. Counting carbohydrates is important to keep your blood glucose at a healthy level, especially if you are using insulin or taking certain medicines for diabetes mellitus. Alcohol Alcohol can cause sudden decreases in blood glucose (hypoglycemia), especially if you use insulin or take certain medicines for diabetes mellitus. Hypoglycemia can be a life-threatening condition. Symptoms of hypoglycemia (sleepiness, dizziness, and disorientation) are similar to symptoms of having too much alcohol.  If your health care provider has given you approval to drink alcohol, do so in moderation and use the following guidelines:  Women should not have more than one drink per day, and men should not have more than two drinks per day. One drink is equal to:  12 oz of beer.  5 oz of wine.  1 oz of hard liquor.  Do not drink on an empty stomach.  Keep yourself hydrated. Have water, diet soda, or unsweetened iced tea.  Regular soda, juice, and other mixers might contain a lot of carbohydrates and should be counted. WHAT FOODS ARE NOT RECOMMENDED? As you make food choices, it is important to remember that all foods are not the same. Some foods have fewer nutrients per serving than other foods, even though they might have the same number of calories or carbohydrates. It is difficult to get your body what it needs when you eat foods with fewer nutrients. Examples of foods that you should avoid that are high in calories and carbohydrates but low in nutrients include:  Trans  fats (most processed foods list trans fats on the Nutrition Facts label).  Regular soda.  Juice.  Candy.  Sweets, such as cake, pie, doughnuts, and cookies.  Fried foods. WHAT FOODS CAN I EAT? Eat nutrient-rich foods, which will nourish your body and keep you healthy. The food you should eat also will  depend on several factors, including:  The calories you need.  The medicines you take.  Your weight.  Your blood glucose level.  Your blood pressure level.  Your cholesterol level. You should eat a variety of foods, including:  Protein.  Lean cuts of meat.  Proteins low in saturated fats, such as fish, egg whites, and beans. Avoid processed meats.  Fruits and vegetables.  Fruits and vegetables that may help control blood glucose levels, such as apples, mangoes, and yams.  Dairy products.  Choose fat-free or low-fat dairy products, such as milk, yogurt, and cheese.  Grains, bread, pasta, and rice.  Choose whole grain products, such as multigrain bread, whole oats, and brown rice. These foods may help control blood pressure.  Fats.  Foods containing healthful fats, such as nuts, avocado, olive oil, canola oil, and fish. DOES EVERYONE WITH DIABETES MELLITUS HAVE THE SAME MEAL PLAN? Because every person with diabetes mellitus is different, there is not one meal plan that works for everyone. It is very important that you meet with a dietitian who will help you create a meal plan that is just right for you.   This information is not intended to replace advice given to you by your health care provider. Make sure you discuss any questions you have with your health care provider.   Document Released: 08/28/2005 Document Revised: 12/22/2014 Document Reviewed: 10/28/2013 Elsevier Interactive Patient Education [employer].

## 2016-03-20 NOTE — Addendum Note (Signed)
Addended by: Alfredia Ferguson A on: 03/20/2016 03:11 PM   Modules accepted: Miquel Dunn

## 2016-03-21 LAB — PSA, MEDICARE: PSA: 0.79 ng/mL (ref <=4.00)

## 2016-04-03 ENCOUNTER — Ambulatory Visit: Payer: Medicare HMO | Admitting: Emergency Medicine

## 2016-04-03 ENCOUNTER — Other Ambulatory Visit: Payer: Self-pay | Admitting: Internal Medicine

## 2016-04-04 NOTE — Telephone Encounter (Signed)
REFILL 

## 2016-04-14 ENCOUNTER — Telehealth: Payer: Self-pay | Admitting: *Deleted

## 2016-04-14 NOTE — Telephone Encounter (Signed)
Spoke with patient and he will come for lab. 

## 2016-04-14 NOTE — Telephone Encounter (Signed)
-----   Message from Hulan Saas, RN sent at 11/15/2015  1:32 PM EST ----- Needs 6 month LFT for SA. Lab in EPIC.

## 2016-04-30 ENCOUNTER — Other Ambulatory Visit: Payer: Self-pay | Admitting: Internal Medicine

## 2016-04-30 MED ORDER — CHLORTHALIDONE 25 MG PO TABS: 25.0000 mg | ORAL_TABLET | Freq: Every day | ORAL | Status: DC

## 2016-04-30 NOTE — Telephone Encounter (Signed)
Rx request sent to pharmacy.  

## 2016-05-21 DIAGNOSIS — R69 Illness, unspecified: Secondary | ICD-10-CM | POA: Diagnosis not present

## 2016-05-27 DIAGNOSIS — R69 Illness, unspecified: Secondary | ICD-10-CM | POA: Diagnosis not present

## 2016-06-18 ENCOUNTER — Ambulatory Visit (INDEPENDENT_AMBULATORY_CARE_PROVIDER_SITE_OTHER): Payer: Medicare HMO | Admitting: Emergency Medicine

## 2016-06-18 VITALS — BP 142/72 | HR 74 | Temp 98.5°F | Resp 16 | Ht 73.5 in | Wt 265.4 lb

## 2016-06-18 DIAGNOSIS — J029 Acute pharyngitis, unspecified: Secondary | ICD-10-CM

## 2016-06-18 DIAGNOSIS — J301 Allergic rhinitis due to pollen: Secondary | ICD-10-CM

## 2016-06-18 DIAGNOSIS — R739 Hyperglycemia, unspecified: Secondary | ICD-10-CM

## 2016-06-18 LAB — POCT GLYCOSYLATED HEMOGLOBIN (HGB A1C): Hemoglobin A1C: 6.3

## 2016-06-18 LAB — POCT RAPID STREP A (OFFICE): RAPID STREP A SCREEN: NEGATIVE

## 2016-06-18 MED ORDER — DOXYCYCLINE HYCLATE 100 MG PO TABS: 100.0000 mg | ORAL_TABLET | Freq: Two times a day (BID) | ORAL | Status: DC

## 2016-06-18 MED ORDER — FLUTICASONE PROPIONATE 50 MCG/ACT NA SUSP: 2.0000 | Freq: Every day | NASAL | Status: DC

## 2016-06-18 NOTE — Patient Instructions (Addendum)
Take the Flonase with your Zyrtec to help with allergy symptoms. You hadvea prescription for doxycycline to fill if necessary.    IF you received an x-ray today, you will receive an invoice from [facility]. Please contact [facility] at [phone] with questions or concerns regarding your invoice.   IF you received labwork today, you will receive an invoice from [employer]. Please contact [facility] at [phone] with questions or concerns regarding your invoice.   Our billing staff will not be able to assist you with questions regarding bills from these companies.  You will be contacted with the lab results as soon as they are available. The fastest way to get your results is to activate your My Chart account. Instructions are located on the last page of this paperwork. If you have not heard from [place] regarding the results in 2 weeks, please contact this office.

## 2016-06-18 NOTE — Progress Notes (Signed)
By signing my name below, I, [name], attest that this documentation has been prepared under the direction and in the presence of [name], MD.  Electronically Signed: Thea Alken, ED Scribe. 06/18/2016. 10:55 AM.  Chief Complaint:  Chief Complaint  Patient presents with  . Nasal Congestion    x 5 days     HPI: [name] is a 70 y.o. male who reports to [facility] today complaining of sore throat and cough. Pt states symptoms started with a sore throat 5-6 days ago but symptoms have moved down into his chest with cough along with nasal congestion. Pt has felt feverish but no measured fever.   Pt has not found a PCP to take over his care.   Past Medical History  Diagnosis Date  . Hypertension   . Pneumonia   . RAD (reactive airway disease)   . Allergic angioedema   . Atrial flutter ([place])   . Allergy    Past Surgical History  Procedure Laterality Date  . Appendectomy    . Tonsillectomy    . Colonoscopy    . Cholecystectomy    . Stress myoview  12/13/2008    EF 61% LV  normal   Social History   Social History  . Marital Status: Married    Spouse Name: N/A  . Number of Children: N/A  . Years of Education: N/A   Social History Main Topics  . Smoking status: Former Smoker    Types: Cigarettes, Pipe    Quit date: 10/05/1996  . Smokeless tobacco: Former Systems developer    Types: Chew     Comment: Occasionally  . Alcohol Use: No  . Drug Use: No  . Sexual Activity: Yes    Birth Control/ Protection: None   Other Topics Concern  . None   Social History Narrative   Family History  Problem Relation Age of Onset  . Pancreatic cancer Mother   . Hypertension Mother   . Cancer Mother   . Hypertension Sister   . Asthma Son   . Stroke Father   . Hypertension Father   . Hypertension Brother   . Hypertension Daughter   . Cancer Maternal Grandfather    Allergies  Allergen Reactions  . Ibuprofen Swelling  . Lisinopril Swelling    Swelling of face and lips   .  Oxaprozin Swelling    Swelling of face and lips  . Ziac [Bisoprolol-Hydrochlorothiazide] Swelling    Per patient records from Dr. Florina Ou.  Thayer Jew Hcl] Itching  . Hctz [Hydrochlorothiazide]   . Norvasc [Amlodipine Besylate]    Prior to Admission medications   Medication Sig Start Date End Date Taking? Authorizing Provider  cetirizine (ZYRTEC) 10 MG tablet Take 10 mg by mouth daily.   Yes Historical Provider, MD  chlorthalidone (HYGROTON) 25 MG tablet Take 1 tablet (25 mg total) by mouth daily. 04/30/16  Yes Pixie Casino, MD  diltiazem (CARDIZEM CD) 300 MG 24 hr capsule TAKE 1 CAPSULE (300 MG TOTAL) BY MOUTH DAILY. 04/04/16  Yes Pixie Casino, MD  EPINEPHrine (EPI-PEN) 0.3 mg/0.3 mL DEVI Inject 0.3 mLs (0.3 mg total) into the muscle once. 10/19/12  Yes Theda Sers, PA-C  metFORMIN (GLUCOPHAGE XR) 500 MG 24 hr tablet Take 1 tablet (500 mg total) by mouth daily with breakfast. 03/20/16  Yes [name], MD  metoprolol succinate (TOPROL-XL) 100 MG 24 hr tablet Take 100 mg by mouth daily. Take with or immediately following a  meal.   Yes Historical Provider, MD     ROS: The patient denies fevers, chills, night sweats, unintentional weight loss, chest pain, palpitations, wheezing, dyspnea on exertion, nausea, vomiting, abdominal pain, dysuria, hematuria, melena, numbness, weakness, or tingling.   All other systems have been reviewed and were otherwise negative with the exception of those mentioned in the HPI and as above.    PHYSICAL EXAM: Filed Vitals:   06/18/16 0953  BP: 142/72  Pulse: 74  Temp: 98.5 F (36.9 C)  Resp: 16   Body mass index is 34.54 kg/(m^2).   General: Alert, no acute distress HEENT:  Normocephalic, atraumatic, oropharynx patent. Nasal congestion otherwise clear.  Eye: Juliette Mangle [facility] Cardiovascular:  Regular rate and rhythm, no rubs murmurs or gallops.  No Carotid bruits, radial pulse intact. No pedal edema.  Respiratory: Clear to  auscultation bilaterally.  No wheezes, rales, or rhonchi.  No cyanosis, no use of accessory musculature Abdominal: No organomegaly, abdomen is soft and non-tender, positive bowel sounds.  No masses. Musculoskeletal: Gait intact. No edema, tenderness Skin: No rashes. Neurologic: Facial musculature symmetric. Psychiatric: Patient acts appropriately throughout our interaction. Lymphatic: No cervical or submandibular lymphadenopathy    LABS: Results for orders placed or performed in visit on 06/18/16  POCT glycosylated hemoglobin (Hb A1C)  Result Value Ref Range   Hemoglobin A1C 6.3   POCT rapid strep A  Result Value Ref Range   Rapid Strep A Screen Negative Negative   ASSESSMENT/PLAN: Diabetes is better. Hemoglobin A1c down to 6.3 from 6.7. I gave him Flonase to use. I gave him a prescription for doxycycline to have in case he develops more of a productive cough and to keep around for tick related illnesses.I personally performed the services described in this documentation, which was scribed in my presence. The recorded information has been reviewed and is accurate.   Gross sideeffects, risk and benefits, and alternatives of medications d/w patient. Patient is aware that all medications have potential sideeffects and we are unable to predict every sideeffect or drug-drug interaction that may occur.  [name] MD 06/18/2016 10:55 AM

## 2016-08-28 DIAGNOSIS — R69 Illness, unspecified: Secondary | ICD-10-CM | POA: Diagnosis not present

## 2016-11-20 DIAGNOSIS — R69 Illness, unspecified: Secondary | ICD-10-CM | POA: Diagnosis not present

## 2016-11-29 ENCOUNTER — Other Ambulatory Visit: Payer: Self-pay | Admitting: Internal Medicine

## 2016-12-10 DIAGNOSIS — L738 Other specified follicular disorders: Secondary | ICD-10-CM | POA: Diagnosis not present

## 2016-12-10 DIAGNOSIS — L723 Sebaceous cyst: Secondary | ICD-10-CM | POA: Diagnosis not present

## 2016-12-10 DIAGNOSIS — D1801 Hemangioma of skin and subcutaneous tissue: Secondary | ICD-10-CM | POA: Diagnosis not present

## 2016-12-10 DIAGNOSIS — L821 Other seborrheic keratosis: Secondary | ICD-10-CM | POA: Diagnosis not present

## 2016-12-10 DIAGNOSIS — Z85828 Personal history of other malignant neoplasm of skin: Secondary | ICD-10-CM | POA: Diagnosis not present

## 2016-12-10 DIAGNOSIS — L570 Actinic keratosis: Secondary | ICD-10-CM | POA: Diagnosis not present

## 2016-12-29 DIAGNOSIS — R69 Illness, unspecified: Secondary | ICD-10-CM | POA: Diagnosis not present

## 2017-03-13 ENCOUNTER — Other Ambulatory Visit: Payer: Self-pay | Admitting: Emergency Medicine

## 2017-03-13 DIAGNOSIS — R739 Hyperglycemia, unspecified: Secondary | ICD-10-CM

## 2017-03-31 ENCOUNTER — Telehealth: Payer: Self-pay | Admitting: *Deleted

## 2017-03-31 NOTE — Telephone Encounter (Signed)
Left message with wife

## 2017-03-31 NOTE — Telephone Encounter (Addendum)
Patient would like to speak with you 1st before scheduling best # [phone] states he can come in early.

## 2017-04-01 ENCOUNTER — Telehealth: Payer: Self-pay

## 2017-04-01 NOTE — Telephone Encounter (Signed)
Patient returned call with pre visit information.

## 2017-04-01 NOTE — Telephone Encounter (Signed)
Called patient at home number. Wife states patient wants me to call him on his cell phone. Waited a few minute as ordered and called cell phone to do pre visit call. No answer. Unable to leave message.

## 2017-04-01 NOTE — Telephone Encounter (Signed)
Mr.Doswell declines AWV at this time.

## 2017-04-02 ENCOUNTER — Ambulatory Visit (INDEPENDENT_AMBULATORY_CARE_PROVIDER_SITE_OTHER): Payer: Medicare HMO | Admitting: Family Medicine

## 2017-04-02 VITALS — BP 140/62 | HR 74 | Temp 98.4°F | Ht 74.0 in | Wt 261.8 lb

## 2017-04-02 DIAGNOSIS — E785 Hyperlipidemia, unspecified: Secondary | ICD-10-CM

## 2017-04-02 DIAGNOSIS — Z23 Encounter for immunization: Secondary | ICD-10-CM | POA: Diagnosis not present

## 2017-04-02 DIAGNOSIS — Z0000 Encounter for general adult medical examination without abnormal findings: Secondary | ICD-10-CM

## 2017-04-02 DIAGNOSIS — E119 Type 2 diabetes mellitus without complications: Secondary | ICD-10-CM

## 2017-04-02 DIAGNOSIS — Z125 Encounter for screening for malignant neoplasm of prostate: Secondary | ICD-10-CM

## 2017-04-02 DIAGNOSIS — Z130 Encounter for screening for diseases of the blood and blood-forming organs and certain disorders involving the immune mechanism: Secondary | ICD-10-CM

## 2017-04-02 DIAGNOSIS — R739 Hyperglycemia, unspecified: Secondary | ICD-10-CM | POA: Diagnosis not present

## 2017-04-02 DIAGNOSIS — I10 Essential (primary) hypertension: Secondary | ICD-10-CM

## 2017-04-02 MED ORDER — CHLORTHALIDONE 25 MG PO TABS: 25.0000 mg | ORAL_TABLET | Freq: Every day | ORAL | 3 refills | Status: DC

## 2017-04-02 MED ORDER — METFORMIN HCL ER 500 MG PO TB24: 500.0000 mg | ORAL_TABLET | Freq: Every day | ORAL | 3 refills | Status: DC

## 2017-04-02 MED ORDER — DILTIAZEM HCL ER COATED BEADS 300 MG PO CP24: ORAL_CAPSULE | ORAL | 3 refills | Status: DC

## 2017-04-02 NOTE — Progress Notes (Addendum)
[facility] [address], [place], [place] [address] [phone]  Date:  04/02/2017   Name:  [name]   DOB:  11-12-1946   MRN:  [id number]  PCP:  Lamar Blinks, MD    Chief Complaint: Annual Exam (Pt here for CPE. )   History of Present Illness:  [name] is a 71 y.o. very pleasant male patient who presents with the following:  Here today as a new patient- he has been a pt of Dr. Everlene Farrier who is now retired, last CPE about a year ago.   History of controlled DM2 on metformin,  dyslipidemia, HTN, atrial flutter Last labs a year ago- will repeat today He does get PSA testing- will do for him today  Due for pneumovax 23.   Singles vaccine- he has not had yet colonoscopy is UTD  Lab Results  Component Value Date   HGBA1C 6.3 06/18/2016   He is on metformin He had eggs, toast and coffee this am for breakfast  No recent atrial flutter- has been released from cardiology care at this time   He does have a ventral hernia which does not bother him  He does exercise in the gym- cardio, weights. He also does some sports like  No CP or SOB  He is married, he has 4 children.  They all live around here.  He does not smoker, does not chew tobacco.  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 Left hip pain 02/25/2016  . Hyperglycemia 12/07/2015  . CMV infection, acute ([place]) 09/11/2015  . Low HDL (under 40) 07/12/2013  . Angioedema 11/08/2012  . Paroxysmal atrial flutter ([place]) 02/04/2012  . Essential hypertension 06/15/2009  . BRONCHOSPASM 06/14/2009    Past Medical History:  Diagnosis Date  . Allergic angioedema   . Allergy   . Atrial flutter ([place])   . Hypertension   . Pneumonia   . RAD (reactive airway disease)     Past Surgical History:  Procedure  Laterality Date  . APPENDECTOMY    . CHOLECYSTECTOMY    . COLONOSCOPY    . stress myoview  12/13/2008   EF 61% LV  normal  . TONSILLECTOMY      Social History  Substance Use Topics  . Smoking status: Former Smoker    Types: Cigarettes, Pipe    Quit date: 10/05/1996  . Smokeless tobacco: Former Systems developer    Types: Chew     Comment: Occasionally  . Alcohol use No    Family History  Problem Relation Age of Onset  . Pancreatic cancer Mother   . Hypertension Mother   . Cancer Mother   . Hypertension Sister   . Asthma Son   . Stroke Father   . Hypertension Father   . Hypertension Brother   . Hypertension Daughter   . Cancer Maternal Grandfather     Allergies  Allergen Reactions  . Ibuprofen Swelling  . Lisinopril Swelling    Swelling of face and lips   . Oxaprozin Swelling    Swelling of face and lips  . Ziac [Bisoprolol-Hydrochlorothiazide] Swelling    Per patient records from Dr. Florina Ou.  Thayer Jew Hcl] Itching  . Hctz [Hydrochlorothiazide]   . Norvasc [Amlodipine Besylate]  Medication list has been reviewed and updated.  Current Outpatient Prescriptions on File Prior to Visit  Medication Sig Dispense Refill  . cetirizine (ZYRTEC) 10 MG tablet Take 10 mg by mouth daily.    . chlorthalidone (HYGROTON) 25 MG tablet TAKE 1 TABLET (25 MG TOTAL) BY MOUTH DAILY. 30 tablet 6  . diltiazem (CARDIZEM CD) 300 MG 24 hr capsule TAKE 1 CAPSULE (300 MG TOTAL) BY MOUTH DAILY. 30 capsule 11  . EPINEPHrine (EPI-PEN) 0.3 mg/0.3 mL DEVI Inject 0.3 mLs (0.3 mg total) into the muscle once. 1 Device 1  . metFORMIN (GLUCOPHAGE-XR) 500 MG 24 hr tablet TAKE 1 TABLET (500 MG TOTAL) BY MOUTH DAILY WITH BREAKFAST. 30 tablet 0   No current facility-administered medications on file prior to visit.     Review of Systems:  As per HPI- otherwise negative.   Physical Examination: Vitals:   04/02/17 1434  BP: 140/62  Pulse: 74  Temp: 98.4 F (36.9 C)   Vitals:    04/02/17 1434  Weight: 261 lb 12.8 oz (118.8 kg)  Height: 6\' 2"  (1.88 m)   Body mass index is 33.61 kg/m. Ideal Body Weight: Weight in (lb) to have BMI = 25: 194.3  GEN: WDWN, NAD, Non-toxic, A & O x 3, looks well, overweight HEENT: Atraumatic, Normocephalic. Neck supple. No masses, No LAD.  Bilateral TM wnl, oropharynx normal.  PEERL,EOMI.   Ears and Nose: No external deformity. CV: RRR, No M/G/R. No JVD. No thrill. No extra heart sounds. PULM: CTA B, no wheezes, crackles, rhonchi. No retractions. No resp. distress. No accessory muscle use. ABD: S, NT, ND, +BS. No rebound. No HSM.  vental hernia- stable EXTR: No c/c/e NEURO Normal gait.  PSYCH: Normally interactive. Conversant. Not depressed or anxious appearing.  Calm demeanor.    Assessment and Plan: Screening for deficiency anemia - Plan: CBC  Dyslipidemia - Plan: Lipid panel  Screening for prostate cancer - Plan: PSA  Controlled type 2 diabetes mellitus without complication, without long-term current use of insulin (HCC) - Plan: Hemoglobin A1c, Comprehensive metabolic panel  Essential hypertension - Plan: chlorthalidone (HYGROTON) 25 MG tablet, diltiazem (CARDIZEM CD) 300 MG 24 hr capsule  Hyperglycemia - Plan: metFORMIN (GLUCOPHAGE-XR) 500 MG 24 hr tablet  Immunization due - Plan: Pneumococcal polysaccharide vaccine 23-valent greater than or equal to 2yo subcutaneous/IM  Here today for a CPE Continue current medications for HTN and DM Pneumovax today Will plan further follow- up pending labs.   Signed Lamar Blinks, MD  Received his labs 4/20 PSA is quite stable.  A1c is fine.  I would recommend a cholesterol med- message to pt  Results for orders placed or performed in visit on 04/02/17  CBC  Result Value Ref Range   WBC 8.8 4.0 - 10.5 K/uL   RBC 4.93 4.22 - 5.81 Mil/uL   Platelets 341.0 150.0 - 400.0 K/uL   Hemoglobin 15.4 13.0 - 17.0 g/dL   HCT 43.7 39.0 - 52.0 %   MCV 88.7 78.0 - 100.0 fl   MCHC 35.2  30.0 - 36.0 g/dL   RDW 13.5 11.5 - 15.5 %  Hemoglobin A1c  Result Value Ref Range   Hgb A1c MFr Bld 6.9 (H) 4.6 - 6.5 %  Comprehensive metabolic panel  Result Value Ref Range   Sodium 137 135 - 145 mEq/L   Potassium 3.5 3.5 - 5.1 mEq/L   Chloride 100 96 - 112 mEq/L   CO2 28 19 - 32 mEq/L   Glucose, Bld 119 (  H) 70 - 99 mg/dL   BUN 18 6 - 23 mg/dL   Creatinine, Ser 1.08 0.40 - 1.50 mg/dL   Total Bilirubin 0.6 0.2 - 1.2 mg/dL   Alkaline Phosphatase 58 39 - 117 U/L   AST 22 0 - 37 U/L   ALT 22 0 - 53 U/L   Total Protein 7.5 6.0 - 8.3 g/dL   Albumin 4.2 3.5 - 5.2 g/dL   Calcium 9.4 8.4 - 10.5 mg/dL   GFR 72.18 >60.00 mL/min  Lipid panel  Result Value Ref Range   Cholesterol 165 0 - 200 mg/dL   Triglycerides 185.0 (H) 0.0 - 149.0 mg/dL   HDL 34.40 (L) >39.00 mg/dL   VLDL 37.0 0.0 - 40.0 mg/dL   LDL Cholesterol 93 0 - 99 mg/dL   Total CHOL/HDL Ratio 5    NonHDL 130.43   PSA  Result Value Ref Range   PSA 0.77 0.10 - 4.00 ng/mL

## 2017-04-02 NOTE — Patient Instructions (Signed)
It was great to see you today- take care and I will be in touch with your labs asap For he time being we will continue your current medications Please ask [employer] about coverage for your Shingrix vaccine

## 2017-04-03 ENCOUNTER — Encounter: Payer: Self-pay | Admitting: Family Medicine

## 2017-04-03 LAB — CBC
HCT: 43.7 % (ref 39.0–52.0)
HEMOGLOBIN: 15.4 g/dL (ref 13.0–17.0)
MCHC: 35.2 g/dL (ref 30.0–36.0)
MCV: 88.7 fl (ref 78.0–100.0)
Platelets: 341 10*3/uL (ref 150.0–400.0)
RBC: 4.93 Mil/uL (ref 4.22–5.81)
RDW: 13.5 % (ref 11.5–15.5)
WBC: 8.8 10*3/uL (ref 4.0–10.5)

## 2017-04-03 LAB — LIPID PANEL
CHOL/HDL RATIO: 5
Cholesterol: 165 mg/dL (ref 0–200)
HDL: 34.4 mg/dL — AB (ref >39.00)
LDL CALC: 93 mg/dL (ref 0–99)
NonHDL: 130.43
Triglycerides: 185 mg/dL — ABNORMAL HIGH (ref 0.0–149.0)
VLDL: 37 mg/dL (ref 0.0–40.0)

## 2017-04-03 LAB — PSA: PSA: 0.77 ng/mL (ref 0.10–4.00)

## 2017-04-03 LAB — COMPREHENSIVE METABOLIC PANEL
ALT: 22 U/L (ref 0–53)
AST: 22 U/L (ref 0–37)
Albumin: 4.2 g/dL (ref 3.5–5.2)
Alkaline Phosphatase: 58 U/L (ref 39–117)
BUN: 18 mg/dL (ref 6–23)
CALCIUM: 9.4 mg/dL (ref 8.4–10.5)
CHLORIDE: 100 meq/L (ref 96–112)
CO2: 28 meq/L (ref 19–32)
CREATININE: 1.08 mg/dL (ref 0.40–1.50)
GFR: 72.18 mL/min (ref >60.00)
Glucose, Bld: 119 mg/dL — ABNORMAL HIGH (ref 70–99)
POTASSIUM: 3.5 meq/L (ref 3.5–5.1)
Sodium: 137 mEq/L (ref 135–145)
Total Bilirubin: 0.6 mg/dL (ref 0.2–1.2)
Total Protein: 7.5 g/dL (ref 6.0–8.3)

## 2017-04-03 LAB — HEMOGLOBIN A1C: Hgb A1c MFr Bld: 6.9 % — ABNORMAL HIGH (ref 4.6–6.5)

## 2017-04-04 ENCOUNTER — Other Ambulatory Visit: Payer: Self-pay | Admitting: Internal Medicine

## 2017-04-30 DIAGNOSIS — H10413 Chronic giant papillary conjunctivitis, bilateral: Secondary | ICD-10-CM | POA: Diagnosis not present

## 2017-04-30 DIAGNOSIS — H0289 Other specified disorders of eyelid: Secondary | ICD-10-CM | POA: Diagnosis not present

## 2017-04-30 DIAGNOSIS — E119 Type 2 diabetes mellitus without complications: Secondary | ICD-10-CM | POA: Diagnosis not present

## 2017-04-30 DIAGNOSIS — H40013 Open angle with borderline findings, low risk, bilateral: Secondary | ICD-10-CM | POA: Diagnosis not present

## 2017-04-30 DIAGNOSIS — H25813 Combined forms of age-related cataract, bilateral: Secondary | ICD-10-CM | POA: Diagnosis not present

## 2017-05-19 ENCOUNTER — Encounter: Payer: Self-pay | Admitting: Family Medicine

## 2017-06-02 ENCOUNTER — Other Ambulatory Visit: Payer: Self-pay | Admitting: Family Medicine

## 2017-06-09 ENCOUNTER — Telehealth: Payer: Self-pay | Admitting: Family Medicine

## 2017-06-09 DIAGNOSIS — Z7689 Persons encountering health services in other specified circumstances: Secondary | ICD-10-CM

## 2017-06-09 NOTE — Telephone Encounter (Signed)
Pt called states to wants to go ahead and have a sleep study. Pt states please call him with appt info or send mychart msg.

## 2017-06-10 ENCOUNTER — Encounter: Payer: Self-pay | Admitting: Family Medicine

## 2017-06-10 NOTE — Addendum Note (Signed)
Addended by: Lamar Blinks C on: 06/10/2017 12:28 PM   Modules accepted: Orders

## 2017-06-15 ENCOUNTER — Ambulatory Visit (INDEPENDENT_AMBULATORY_CARE_PROVIDER_SITE_OTHER): Payer: Medicare HMO | Admitting: Neurology

## 2017-06-15 ENCOUNTER — Encounter: Payer: Self-pay | Admitting: Neurology

## 2017-06-15 VITALS — BP 129/68 | HR 78 | Ht 74.0 in | Wt 257.0 lb

## 2017-06-15 DIAGNOSIS — R351 Nocturia: Secondary | ICD-10-CM

## 2017-06-15 DIAGNOSIS — E669 Obesity, unspecified: Secondary | ICD-10-CM

## 2017-06-15 DIAGNOSIS — R0683 Snoring: Secondary | ICD-10-CM | POA: Diagnosis not present

## 2017-06-15 DIAGNOSIS — Z8679 Personal history of other diseases of the circulatory system: Secondary | ICD-10-CM | POA: Diagnosis not present

## 2017-06-15 DIAGNOSIS — R0681 Apnea, not elsewhere classified: Secondary | ICD-10-CM | POA: Diagnosis not present

## 2017-06-15 NOTE — Patient Instructions (Signed)

## 2017-06-15 NOTE — Progress Notes (Signed)
Subjective:    [name] ID: [name] is a 71 y.o. male.  HPI     Jared Age, MD, PhD [facility] [address], [address] [place], [place] [address]  Dear Dr. Lorelei [name],  I saw your [name], [name], upon your kind request in my neurologic clinic today for initial consultation of his sleep disorder, in particular, concern for underlying obstructive sleep apnea. [name] is unaccompanied today. As you know, [name] is a 71 year old right-handed gentleman with an underlying medical history of allergies, atrial flutter, hypertension, reactive airways disease, history of angioedema, hyperlipidemia, type 2 diabetes, and obesity, who reports snoring and excessive daytime somnolence. I reviewed your office note from 04/02/2017. His ophthalmologist, Dr. Katy [name], had recommended a sleep study as well. Snoring can be loud, and he may have had witnessed apneas. He has maintained his weight in Jared past few years. His Epworth sleepiness score is 6 out of 24 today, fatigue score is 20 out of 63. 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 He lives with his wife, he has 2 sons and 2 daughters, 5 grandchildren. He quit smoking in 1990, has not had alcohol on a regular basis, maybe 2 or 3 times Jared year at this time, caffeine in Jared form of coffee, usually 2 cups Jared day. He has woken up with dry mouth. He has nocturia about once or twice Jared average night, denies morning headaches or restless leg symptoms. His bedtime is around 10:30 or 11 and wake up time without alarm clock is typically around 5:30 or 6 AM. He had a tonsillectomy as an adult, appendectomy as an adult.  His Past Medical History Is Significant For: Past Medical History:  Diagnosis Date  . Allergic angioedema   . Allergy   . Atrial flutter ([place])   . Hypertension   . Pneumonia   . RAD (reactive airway disease)     His Past Surgical History Is Significant For: Past Surgical  History:  Procedure Laterality Date  . APPENDECTOMY    . CHOLECYSTECTOMY    . COLONOSCOPY    . stress myoview  12/13/2008   EF 61% LV  normal  . TONSILLECTOMY      His Family History Is Significant For: Family History  Problem Relation [name] of Onset  . Pancreatic cancer Mother   . Hypertension Mother   . Cancer Mother   . Hypertension Sister   . Asthma Son   . Stroke Father   . Hypertension Father   . Hypertension Brother   . Hypertension Daughter   . Cancer Maternal Grandfather     His Social History Is Significant For: Social History   Social History  . Marital status: Married    Spouse name: N/A  . Number of children: N/A  . Years of education: N/A   Social History Main Topics  . Smoking status: Former Smoker    Types: Cigarettes, Pipe    Quit date: 10/05/1996  . Smokeless tobacco: Former Systems developer    Types: Chew     Comment: Occasionally  . Alcohol use No  . Drug use: No  . Sexual activity: Yes    Birth control/ protection: None   Other Topics Concern  . None   Social History Narrative  . None    His Allergies Are:  Allergies  Allergen Reactions  . Ibuprofen Swelling  . Lisinopril Swelling    Swelling of face and lips   . Oxaprozin Swelling  Swelling of face and lips  . Ziac [Bisoprolol-Hydrochlorothiazide] Swelling    [name] records from [name].  [name]] Itching  . Hctz [Hydrochlorothiazide]   . Norvasc [Amlodipine Besylate]   :   His Current Medications Are:  Outpatient Encounter Prescriptions as of 06/15/2017  Medication Sig  . cetirizine (ZYRTEC) 10 MG tablet Take 10 mg by mouth daily.  . chlorthalidone (HYGROTON) 25 MG tablet Take 1 tablet (25 mg total) by mouth daily.  Marland Kitchen diltiazem (CARDIZEM CD) 300 MG 24 hr capsule TAKE 1 CAPSULE (300 MG TOTAL) BY MOUTH DAILY.  Marland Kitchen diltiazem (CARDIZEM CD) 300 MG 24 hr capsule Take 1 capsule (300 mg total) by mouth daily. Needs appointment for refills.  Marland Kitchen EPINEPHrine  (EPI-PEN) 0.3 mg/0.3 mL DEVI Inject 0.3 mLs (0.3 mg total) into Jared muscle once.  . metFORMIN (GLUCOPHAGE-XR) 500 MG 24 hr tablet Take 1 tablet (500 mg total) by mouth daily with breakfast.   No facility-administered encounter medications on file as of 06/15/2017.   :  Review of Systems:  Out of a complete 14 point review of systems, all are reviewed and negative with Jared exception of these symptoms as listed below: Review of Systems  Neurological:       Pt presents today to discuss his snoring. Pt has never had a sleep study.  Epworth Sleepiness Scale 0= would never doze 1= slight chance of dozing 2= moderate chance of dozing 3= high chance of dozing  Sitting and reading: 1 Watching TV: 1 Sitting inactive in a public place (ex. Theater or meeting): 1 As a passenger in a car for an hour without a break: 1 Lying down to rest in Jared afternoon: 2 Sitting and talking to someone: 0 Sitting quietly after lunch (no alcohol): 0 In a car, while stopped in traffic: 0 Total: 6     Objective:  Neurological Exam  Physical Exam Physical Examination:   Vitals:   06/15/17 1452  BP: 129/68  Pulse: 78    General Examination: [name] is a very pleasant 71 y.o. male in no acute distress. He appears well-developed and well-nourished and well groomed.   HEENT: Normocephalic, atraumatic, pupils are equal, round and reactive to light and accommodation. Extraocular tracking is good without limitation to gaze excursion or nystagmus noted. Normal smooth pursuit is noted. Hearing is grossly intact. Face is symmetric with normal facial animation and normal facial sensation. Speech is clear with no dysarthria noted. There is no hypophonia. There is no lip, neck/head, jaw or voice tremor. Neck is supple with full range of passive and active motion. There are no carotid bruits on auscultation. Oropharynx exam reveals: mild mouth dryness, good dental hygiene and moderate airway crowding, due to larger  uvula. Mallampati is class II. Tongue protrudes centrally and palate elevates symmetrically. Tonsils are 2+ in size. Neck size is 18 inches. He has a mild or nearly overbite.  Chest: Clear to auscultation without wheezing, rhonchi or crackles noted.  Heart: S1+S2+0, regular and normal without murmurs, rubs or gallops noted.   Abdomen: Soft, non-tender and non-distended with normal bowel sounds appreciated on auscultation.  Extremities: There is no pitting edema in Jared distal lower extremities bilaterally. Pedal pulses are intact.  Skin: Warm and dry without trophic changes noted. There are no varicose veins.  Musculoskeletal: exam reveals no obvious joint deformities, tenderness or joint swelling or erythema.   Neurologically:  Mental status: [name] is awake, alert and oriented in all 4 spheres.  His immediate and remote memory, attention, language skills and fund of knowledge are appropriate. There is no evidence of aphasia, agnosia, apraxia or anomia. Speech is clear with normal prosody and enunciation. Thought process is linear. Mood is normal and affect is normal.  Cranial nerves II - XII are as described above under HEENT exam. In addition: shoulder shrug is normal with equal shoulder height noted. Motor exam: Normal bulk, strength and tone is noted. There is no drift, tremor or rebound. Romberg is negative. Reflexes are 2+ throughout. Fine motor skills and coordination: intact with normal finger taps, normal hand movements, normal rapid alternating patting, normal foot taps and normal foot agility.  Cerebellar testing: No dysmetria or intention tremor on finger to nose testing. Heel to shin is unremarkable bilaterally. There is no truncal or gait ataxia.  Sensory exam: intact to light touch in Jared upper and lower extremities.  Gait, station and balance: He stands easily. No veering to one side is noted. No leaning to one side is noted. Posture is [name]-appropriate and stance is narrow  based. Gait shows normal stride length and normal pace. No problems turning are noted. Tandem walk is unremarkable. Intact toe and heel stance is noted.               Assessment and Plan:   In summary, [name] is a very pleasant 71 y.o.-year old male  with an underlying medical history of allergies, atrial flutter, hypertension, reactive airways disease, history of angioedema, hyperlipidemia, type 2 diabetes, and obesity, whose history and physical exam are concerning for obstructive sleep apnea (OSA). I had a long chat with [name] about my findings and Jared diagnosis of OSA, its prognosis and treatment options. We talked about medical treatments, surgical interventions and non-pharmacological approaches. I explained in particular Jared risks and ramifications of untreated moderate to severe OSA, especially with respect to developing cardiovascular disease down Jared Road, including congestive heart failure, difficult to treat hypertension, cardiac arrhythmias, or stroke. Even type 2 diabetes has, in part, been linked to untreated OSA. Symptoms of untreated OSA include daytime sleepiness, memory problems, mood irritability and mood disorder such as depression and anxiety, lack of energy, as well as recurrent headaches, especially morning headaches. We talked about trying to maintain a healthy lifestyle in general, as well as Jared importance of weight control. I encouraged [name] to eat healthy, exercise daily and keep well hydrated, to keep a scheduled bedtime and wake time routine, to not skip any meals and eat healthy snacks in between meals. I advised [name] not to drive when feeling sleepy.   I recommended Jared following at this time: sleep study with potential positive airway pressure titration. (We will score hypopneas at 4%).   I explained Jared sleep test procedure to [name] and also outlined possible surgical and non-surgical treatment options of OSA, including Jared use of a  custom-made dental device (which would require a referral to a specialist dentist or oral surgeon), upper airway surgical options, such as pillar implants, radiofrequency surgery, tongue base surgery, and UPPP (which would involve a referral to an ENT surgeon). Rarely, jaw surgery such as mandibular advancement may be considered.  I also explained Jared CPAP treatment option to [name], who indicated that he would be willing to try CPAP if Jared need arises. I explained Jared importance of being compliant with PAP treatment, not only for insurance purposes but primarily to improve His symptoms, and for [name]'s long term health  benefit, including to reduce His cardiovascular risks. I answered all his questions today and [name] was in agreement. I would like to see him back after Jared sleep study is completed and encouraged him to call with any interim questions, concerns, problems or updates.   Thank you very much for allowing me to participate in Jared care of this nice [name]. If I can be of any further assistance to you please do not hesitate to call me at [phone].  Sincerely,   Jared Age, MD, PhD

## 2017-08-09 ENCOUNTER — Ambulatory Visit (INDEPENDENT_AMBULATORY_CARE_PROVIDER_SITE_OTHER): Payer: Medicare HMO | Admitting: Neurology

## 2017-08-09 DIAGNOSIS — G4720 Circadian rhythm sleep disorder, unspecified type: Secondary | ICD-10-CM

## 2017-08-09 DIAGNOSIS — G4733 Obstructive sleep apnea (adult) (pediatric): Secondary | ICD-10-CM

## 2017-08-12 NOTE — Procedures (Signed)
PATIENT'S NAME:  [name], [name] DOB:      09-12-1946      MR#:    [id number]     DATE OF RECORDING: 08/09/2017 REFERRING M.D.:  Lamar Blinks MD Study Performed:   Baseline Polysomnogram HISTORY: 71 year old man with a history of allergies, atrial flutter, hypertension, reactive airways disease, history of angioedema, hyperlipidemia, type 2 diabetes, and obesity, who reports snoring and excessive daytime somnolence. The patient endorsed the Epworth Sleepiness Scale at 6/24 points. The patient's weight 257 pounds with a height of 74 (inches), resulting in a BMI of 33.1 kg/m2. The patient's neck circumference measured 18 inches.  CURRENT MEDICATIONS: Cetirizine, Chlorthalidone, Diltiazem, Epinephrine, Metformin,   PROCEDURE:  This is a multichannel digital polysomnogram utilizing the Somnostar 11.2 system.  Electrodes and sensors were applied and monitored per AASM Specifications.   EEG, EOG, Chin and Limb EMG, were sampled at 200 Hz.  ECG, Snore and Nasal Pressure, Thermal Airflow, Respiratory Effort, CPAP Flow and Pressure, Oximetry was sampled at 50 Hz. Digital video and audio were recorded.      BASELINE STUDY  Lights Out was at 22:37 and Lights On at 05:16.  Total recording time (TRT) was 399 minutes, with a total sleep time (TST) of  295.5 minutes.   The patient's sleep latency was 13.5 minutes, which is normal. REM latency was 86.5 minutes, which is normal. The sleep efficiency was 74.1%, which is reduced.     SLEEP ARCHITECTURE: WASO (Wake after sleep onset) was 93 minutes with moderate sleep fragmentation noted. There were 45 minutes in Stage N1, 196.5 minutes Stage N2, 0 minutes Stage N3 and 54 minutes in Stage REM.  The percentage of Stage N1 was 15.2%, which is increased, Stage N2 was 66.5%, which is increased, Stage N3 was absent and Stage R (REM sleep) was 18.3%, which is near normal. The arousals were noted as: 40 were spontaneous, 0 were associated with PLMs, 37 were associated  with respiratory events.    Audio and video analysis did not show any abnormal or unusual movements, behaviors, phonations or vocalizations.  The patient took 1 bathroom break. Interminttent mild to moderate snoring was noted.  EKG was in keeping with normal sinus rhythm (NSR).  RESPIRATORY ANALYSIS: There were a total of 37 respiratory events:  13 obstructive apneas, 0 central apneas and 0 mixed apneas with a total of 13 apneas and an apnea index (AI) of 2.6 /hour. There were 24 hypopneas with a hypopnea index of 4.9 /hour. The patient also had 0 respiratory event related arousals (RERAs).      The total APNEA/HYPOPNEA INDEX (AHI) was 7.5/hour and the total RESPIRATORY DISTURBANCE INDEX was 7.5 /hour.  16 events occurred in REM sleep and 32 events in NREM. The REM AHI was 17.8 /hour, versus a non-REM AHI of 5.2. The patient spent 277.5 minutes of total sleep time in the supine position and 18 minutes in non-supine.. The supine AHI was 7.8 versus a non-supine AHI of 3.3.  OXYGEN SATURATION & C02:  The Wake baseline 02 saturation was 97%, with the lowest being 83%. Time spent below 89% saturation equaled 9 minutes.  PERIODIC LIMB MOVEMENTS: The patient had a total of 0 Periodic Limb Movements.  The Periodic Limb Movement (PLM) index was 0 and the PLM Arousal index was 0/hour.  Post-study, the patient indicated that sleep was worse than usual.   IMPRESSION:  1. Obstructive Sleep Apnea (OSA) 2. Dysfunctions associated with sleep stages or arousal from sleep  RECOMMENDATIONS:  1. This study demonstrates overall mild to moderate obstructive sleep apnea with a total AHI of 7.5/hour, REM AHI of 17.8/hour, supine AHI of 7.8/hour and O2 nadir of 83%. Given the patient's medical history and sleep related complaints, a full-night CPAP titration study is recommended to optimize therapy. Other treatment options, generally speaking, may include avoidance of supine sleep position along with weight loss, upper  airway or jaw surgery in selected patients or the use of an oral appliance in certain patients. ENT evaluation and/or consultation with a maxillofacial surgeon or dentist may be feasible in some instances.    2. Please note that untreated obstructive sleep apnea carries additional perioperative morbidity. Patients with significant obstructive sleep apnea should receive perioperative PAP therapy and the surgeons and particularly the anesthesiologist should be informed of the diagnosis and the severity of the sleep disordered breathing. 3. This study shows sleep fragmentation and abnormal sleep stage percentages; these are nonspecific findings and per se do not signify an intrinsic sleep disorder or a cause for the patient's sleep-related symptoms. Causes include (but are not limited to) the first night effect of the sleep study, circadian rhythm disturbances, medication effect or an underlying mood disorder or medical problem.  4. The patient should be cautioned not to drive, work at heights, or operate dangerous or heavy equipment when tired or sleepy. Review and reiteration of good sleep hygiene measures should be pursued with any patient. 5. The patient will be seen in follow-up by Dr. Rexene Alberts at [facility] for discussion of the test results and further management strategies. The referring provider will be notified of the test results.  I certify that I have reviewed the entire raw data recording prior to the issuance of this report in accordance with the Standards of Accreditation of the American Academy of Sleep Medicine (AASM)   Star Age, MD, PhD Diplomat, American Board of Psychiatry and Neurology (Neurology and Sleep Medicine)

## 2017-08-12 NOTE — Addendum Note (Signed)
Addended by: Star Age on: 08/12/2017 07:53 AM   Modules accepted: Orders

## 2017-08-12 NOTE — Progress Notes (Signed)
Patient referred by Dr. Lorelei Pont, seen by me on 06/15/17, diagnostic PSG on 08/09/17.    Please call and notify the patient that the recent sleep study did confirm the diagnosis of mild to moderate obstructive sleep apnea with a total AHI of 7.5/hour, REM AHI of 17.8/hour, supine AHI of 7.8/hour and O2 nadir of 83%. Given the patient's medical history and sleep related complaints, I recommend treatment for this in the form of CPAP. This will require a repeat sleep study for proper titration and mask fitting. Please explain to patient and arrange for a CPAP titration study. I have placed an order in the chart. Thanks, and please route to [facility] for scheduling next sleep study.  Star Age, MD, PhD [facility])

## 2017-08-13 ENCOUNTER — Telehealth: Payer: Self-pay

## 2017-08-13 NOTE — Telephone Encounter (Signed)
-----   Message from Star Age, MD sent at 08/12/2017  7:53 AM EDT ----- Patient referred by Dr. Lorelei Pont, seen by me on 06/15/17, diagnostic PSG on 08/09/17.    Please call and notify the patient that the recent sleep study did confirm the diagnosis of mild to moderate obstructive sleep apnea with a total AHI of 7.5/hour, REM AHI of 17.8/hour, supine AHI of 7.8/hour and O2 nadir of 83%. Given the patient's medical history and sleep related complaints, I recommend treatment for this in the form of CPAP. This will require a repeat sleep study for proper titration and mask fitting. Please explain to patient and arrange for a CPAP titration study. I have placed an order in the chart. Thanks, and please route to [facility] for scheduling next sleep study.  Star Age, MD, PhD [facility])

## 2017-08-13 NOTE — Telephone Encounter (Signed)
I called pt. I advised pt that Dr. Rexene Alberts reviewed their sleep study results and found that does have mild to moderate osa and Dr. Rexene Alberts recommends treatment for this in the form of a cpap. Dr. Rexene Alberts recommends that pt return for a repeat sleep study in order to properly titrate the cpap and ensure a good mask fit. Pt is agreeable to returning for a titration study. I advised pt that our sleep lab will file with pt's insurance and call pt to schedule the sleep study when we hear back from the pt's insurance regarding coverage of this sleep study. Pt verbalized understanding of results. Pt had no questions at this time but was encouraged to call back if questions arise.

## 2017-08-13 NOTE — Telephone Encounter (Signed)
I called pt to discuss his sleep study results. No answer, left a message asking him to call me back. 

## 2017-09-13 ENCOUNTER — Ambulatory Visit (INDEPENDENT_AMBULATORY_CARE_PROVIDER_SITE_OTHER): Payer: Medicare HMO | Admitting: Neurology

## 2017-09-13 DIAGNOSIS — G4733 Obstructive sleep apnea (adult) (pediatric): Secondary | ICD-10-CM | POA: Diagnosis not present

## 2017-09-13 DIAGNOSIS — G4720 Circadian rhythm sleep disorder, unspecified type: Secondary | ICD-10-CM

## 2017-09-15 NOTE — Procedures (Signed)
PATIENT'S NAME:  [name], [name] DOB:      [birth year]      MR#:    [id number]     DATE OF RECORDING: 09/13/2017 REFERRING M.D.:  Lamar Blinks MD Study Performed:   CPAP  Titration HISTORY:  71 year old right-handed gentleman with an underlying medical history of allergies, atrial flutter, hypertension, reactive airways disease, history of angioedema, hyperlipidemia, type 2 diabetes, and obesity, who presents for a CPAP titration. His baseline sleep study on 08/09/17 showed an AHI of 7.5/hour, REM AHI of 17.8/hour, supine AHI of 7.8/hour and O2 nadir of 83%. The patient endorsed the Epworth Sleepiness Scale at 6/24 points. The patient's weight 258 pounds with a height of 74 (inches), resulting in a BMI of 33.1 kg/m2. The patient's neck circumference measured 18 inches.  CURRENT MEDICATIONS: Cetirizine, Chlorthalidone, Diltiazem, Epinephrine, Metformin,    PROCEDURE:  This is a multichannel digital polysomnogram utilizing the SomnoStar 11.2 system.  Electrodes and sensors were applied and monitored per AASM Specifications.   EEG, EOG, Chin and Limb EMG, were sampled at 200 Hz.  ECG, Snore and Nasal Pressure, Thermal Airflow, Respiratory Effort, CPAP Flow and Pressure, Oximetry was sampled at 50 Hz. Digital video and audio were recorded.      The patient was fitted with a large Eson nasal mask. CPAP was initiated at 5 cmH20 with heated humidity per AASM standards and pressure was advanced to 9 cmH20 because of hypopneas, apneas and desaturations.  At a PAP pressure of 9 cmH20, there was a reduction of the AHI to 0 with supine REM sleep achieved and O2 nadir of 91%.   Lights Out was at 22:45 and Lights On at 04:55. Total recording time (TRT) was 370.5 minutes, with a total sleep time (TST) of 262 minutes. The patient's sleep latency was 14.5 minutes with 1 minutes of wake time after sleep onset. REM latency was 95.5 minutes, which is normal. The sleep efficiency was 70.7 %.    SLEEP ARCHITECTURE:  WASO (Wake after sleep onset) was 103.5 minutes with several longer periods of wakefulness. There were 25.5 minutes in Stage N1, 183.5 minutes Stage N2, 0 minutes Stage N3 and 53 minutes in Stage REM. The percentage of Stage N1 was 9.7%, which is increased, Stage N2 was 70.%, which is increased, Stage N3 was absent and Stage R (REM sleep) was 20.2%, which is normal.  The arousals were noted as: 27 were spontaneous, 0 were associated with PLMs, 4 were associated with respiratory events.  Audio and video analysis did not show any abnormal or unusual movements, behaviors, phonations or vocalizations. The patient took 1 bathroom break. The EKG was in keeping with normal sinus rhythm (NSR).  RESPIRATORY ANALYSIS:  There was a total of 4 respiratory events: 0 obstructive apneas, 0 central apneas and 0 mixed apneas with a total of 0 apneas and an apnea index (AI) of 0 /hour. There were 4 hypopneas with a hypopnea index of .9/hour. The patient also had 0 respiratory event related arousals (RERAs).      The total APNEA/HYPOPNEA INDEX  (AHI) was .9 /hour and the total RESPIRATORY DISTURBANCE INDEX was .9 .hour  1 events occurred in REM sleep and 3 events in NREM. The REM AHI was 1.1 /hour versus a non-REM AHI of .9 /hour.  The patient spent 262 minutes of total sleep time in the supine position and 0 minutes in non-supine. The supine AHI was 0.9, versus a non-supine AHI of 0.0.  OXYGEN SATURATION & C02:  The baseline 02 saturation was 95%, with the lowest being 88%. Time spent below 89% saturation equaled 1 minutes.  PERIODIC LIMB MOVEMENTS: The patient had a total of 0 Periodic Limb Movements. The Periodic Limb Movement (PLM) index was 0 and the PLM Arousal index was 0 /hour.  Post-study, the patient indicated that sleep was worse than usual.   IMPRESSION:   1. Obstructive Sleep Apnea (OSA) 2. Dysfunctions associated with sleep stages or arousal from sleep   RECOMMENDATIONS:   1. This study demonstrates  resolution of the patient's obstructive sleep apnea with CPAP therapy. I will, therefore, start the patient on home CPAP treatment at a pressure of 9 cm via large nasal mask with heated humidity. The patient should be reminded to be fully compliant with PAP therapy to improve sleep related symptoms and decrease long term cardiovascular risks. The patient should be reminded, that it may take up to 3 months to get fully used to using PAP with all planned sleep. The earlier full compliance is achieved, the better long term compliance tends to be. Please note that untreated obstructive sleep apnea carries additional perioperative morbidity. Patients with significant obstructive sleep apnea should receive perioperative PAP therapy and the surgeons and particularly the anesthesiologist should be informed of the diagnosis and the severity of the sleep disordered breathing. 2. This study shows sleep fragmentation and abnormal sleep stage percentages; these are nonspecific findings and per se do not signify an intrinsic sleep disorder or a cause for the patient's sleep-related symptoms. Causes include (but are not limited to) the first night effect of the sleep study, circadian rhythm disturbances, medication effect or an underlying mood disorder or medical problem.  3. The patient should be cautioned not to drive, work at heights, or operate dangerous or heavy equipment when tired or sleepy. Review and reiteration of good sleep hygiene measures should be pursued with any patient. 4. The patient will be seen in follow-up by Dr. Rexene Alberts at [facility] for discussion of the test results and further management strategies. The referring provider will be notified of the test results.   I certify that I have reviewed the entire raw data recording prior to the issuance of this report in accordance with the Standards of Accreditation of the American Academy of Sleep Medicine (AASM)     Star Age, MD, PhD Diplomat, American Board of  Psychiatry and Neurology (Neurology and Sleep Medicine)

## 2017-09-15 NOTE — Addendum Note (Signed)
Addended by: Star Age on: 09/15/2017 07:45 AM   Modules accepted: Orders

## 2017-09-15 NOTE — Progress Notes (Signed)
Patient referred by Dr. Lorelei Pont, seen by me on 06/15/17, diagnostic PSG on 08/09/17, CPAP study on 09/13/17.   Please call and inform patient that I have entered an order for treatment with positive airway pressure (PAP) treatment of obstructive sleep apnea (OSA). He did well during the latest sleep study with CPAP. We will, therefore, arrange for a machine for home use through a DME (durable medical equipment) company of His choice; and I will see the patient back in follow-up in about 10 weeks. Please also explain to the patient that I will be looking out for compliance data, which can be downloaded from the machine (stored on an SD card, that is inserted in the machine) or via remote access through a modem, that is built into the machine. At the time of the followup appointment we will discuss sleep study results and how it is going with PAP treatment at home. Please advise patient to bring His machine at the time of the first FU visit, even though this is cumbersome. Bringing the machine for every visit after that will likely not be needed, but often helps for the first visit to troubleshoot if needed. Please re-enforce the importance of compliance with treatment and the need for [place] to monitor compliance data - often an insurance requirement and actually good feedback for the patient as far as how they are doing.  Also remind patient, that any interim PAP machine or mask issues should be first addressed with the DME company, as they can often help better with technical and mask fit issues. Please ask if patient has a preference regarding DME company.  Please also make sure, the patient has a follow-up appointment with me in about 10 weeks from the setup date, thanks.  Once you have spoken to the patient - and faxed/routed report to PCP and referring MD (if other than PCP), you can close this encounter, thanks,   Star Age, MD, PhD [facility] ([place])

## 2017-09-16 ENCOUNTER — Telehealth: Payer: Self-pay

## 2017-09-16 NOTE — Telephone Encounter (Signed)
I called pt to discuss his sleep study results. No answer, left a message asking him to call me back. 

## 2017-09-16 NOTE — Telephone Encounter (Signed)
-----   Message from Jared Age, MD sent at 09/15/2017  7:45 AM EDT ----- Patient referred by Dr. Lorelei Pont, seen by me on 06/15/17, diagnostic PSG on 08/09/17, CPAP study on 09/13/17.   Please call and inform patient that I have entered an order for treatment with positive airway pressure (PAP) treatment of obstructive sleep apnea (OSA). He did well during the latest sleep study with CPAP. We will, therefore, arrange for a machine for home use through a DME (durable medical equipment) company of His choice; and I will see the patient back in follow-up in about 10 weeks. Please also explain to the patient that I will be looking out for compliance data, which can be downloaded from the machine (stored on an SD card, that is inserted in the machine) or via remote access through a modem, that is built into the machine. At the time of the followup appointment we will discuss sleep study results and how it is going with PAP treatment at home. Please advise patient to bring His machine at the time of the first FU visit, even though this is cumbersome. Bringing the machine for every visit after that will likely not be needed, but often helps for the first visit to troubleshoot if needed. Please re-enforce the importance of compliance with treatment and the need for [place] to monitor compliance data - often an insurance requirement and actually good feedback for the patient as far as how they are doing.  Also remind patient, that any interim PAP machine or mask issues should be first addressed with the DME company, as they can often help better with technical and mask fit issues. Please ask if patient has a preference regarding DME company.  Please also make sure, the patient has a follow-up appointment with me in about 10 weeks from the setup date, thanks.  Once you have spoken to the patient - and faxed/routed report to PCP and referring MD (if other than PCP), you can close this encounter, thanks,   Jared Age, MD,  PhD [facility] ([place])

## 2017-09-18 NOTE — Telephone Encounter (Signed)
Pt returned RN's call. He request to please call him on his cell at [phone]

## 2017-09-21 NOTE — Telephone Encounter (Signed)
I called [name]. I advised [name] that Dr. Rexene Alberts reviewed their sleep study results and found that [name] did well during the latest sleep study. Dr. Rexene Alberts recommends that [name] start a cpap at home. I reviewed PAP compliance expectations with the [name]. [name] is agreeable to starting a CPAP. I advised [name] that an order will be sent to a DME, [facility], and [employer] will call the [name] within about one week after they file with the [name]'s insurance. [facility] will show the [name] how to use the machine, fit for masks, and troubleshoot the CPAP if needed. A follow up appt was made for insurance purposes with Dr. Rexene Alberts on 11/24/2017 at 8:30am. [name] verbalized understanding to arrive 15 minutes early and bring their CPAP. A letter with all of this information in it will be mailed to the [name] as a reminder. I verified with the [name] that the address we have on file is correct. [name] verbalized understanding of results. [name] had no questions at this time but was encouraged to call back if questions arise.

## 2017-10-20 DIAGNOSIS — G4733 Obstructive sleep apnea (adult) (pediatric): Secondary | ICD-10-CM | POA: Diagnosis not present

## 2017-11-02 DIAGNOSIS — R69 Illness, unspecified: Secondary | ICD-10-CM | POA: Diagnosis not present

## 2017-11-19 DIAGNOSIS — G4733 Obstructive sleep apnea (adult) (pediatric): Secondary | ICD-10-CM | POA: Diagnosis not present

## 2017-11-23 ENCOUNTER — Telehealth: Payer: Self-pay

## 2017-11-23 NOTE — Telephone Encounter (Signed)
I called pt. I advised him that his appt on 11/24/17 will need to be rescheduled due to the weather. Pt is agreeable to an apt on 11/25/17 at 1:00pm. Pt verbalized understanding of new appt date and time.

## 2017-11-24 ENCOUNTER — Ambulatory Visit: Payer: Self-pay | Admitting: Neurology

## 2017-11-24 ENCOUNTER — Encounter: Payer: Self-pay | Admitting: Neurology

## 2017-11-25 ENCOUNTER — Ambulatory Visit: Payer: Medicare HMO | Admitting: Neurology

## 2017-11-25 ENCOUNTER — Encounter: Payer: Self-pay | Admitting: Neurology

## 2017-11-25 VITALS — BP 157/75 | HR 87 | Ht 74.0 in | Wt 263.0 lb

## 2017-11-25 DIAGNOSIS — Z9989 Dependence on other enabling machines and devices: Secondary | ICD-10-CM

## 2017-11-25 DIAGNOSIS — G4733 Obstructive sleep apnea (adult) (pediatric): Secondary | ICD-10-CM

## 2017-11-25 NOTE — Progress Notes (Signed)
Subjective:    Patient ID: [name] is a 71 y.o. male.  HPI     Interim history:   [name] is a 71 year old right-handed gentleman with an underlying medical history of allergies, atrial flutter, hypertension, reactive airways disease, history of angioedema, hyperlipidemia, type 2 diabetes, and obesity, who presents for follow-up consultation of his obstructive sleep apnea, after sleep study testing. The patient is unaccompanied today. I first met him on 06/15/2017 at the request of his primary care physician, at which time he reported snoring and daytime somnolence. He was advised to proceed with sleep study testing. He had a baseline sleep study, followed by a CPAP titration study. I went over his test results with him in detail today. Baseline sleep study from 08/09/2017 showed a sleep efficiency of 74.1%, sleep latency of 13.5 minutes and REM latency of 86.5 minutes. He had absence of slow-wave sleep, an increased percentage of stage II and stage I sleep, and near normal percentage of REM sleep. Overall AHI was 7.5 per hour, REM AHI 17.8 per hour, supine AHI was 7.8 per hour. Average oxygen saturation was 97%, nadir was 83%. He had no significant PLMS. I advised him to proceed with a CPAP titration study. He had this on 09/13/2017. Sleep efficiency was 70.7%, sleep latency 14.5 minutes, REM latency was 95.5 minutes. He had absence of slow-wave sleep and REM sleep was 20.2%. He was fitted with a nasal mask and CPAP was titrated from 5 cm to 9 cm. On the final pressure his AHI was 0 per hour with supine REM sleep achieved, O2 nadir of 91%. He had no significant PLMS. Based on his test results I prescribed CPAP therapy for home use.  Today, 11/25/2017: I reviewed his CPAP compliance data from 10/26/2017 through 11/24/2017 which is a total of 30 days, during which time he used his machine 24 days with percent used days greater than 4 hours at 57%, indicating suboptimal compliance with an  average usage of 4 hours and 33 minutes for days on treatment. Residual AHI borderline at 4.4 per hour, leak on the high side with the 95th percentile at 34.3 L/m on a pressure of 9 cm with EPR of 3. He reports having had some issues with her machine in the first few days of usage. He had not connected a part of the hose correctly. It may not have registered his usage at the time. He was still trying it every night, he reports that he has not skipped any night since starting treatment. He has not quite felt a telltale improvement in his sleep but he is willing to continue treatment and continue to try. He does not care for his nasal mask and has been in touch with his DME company regarding mask issues.  The patient's allergies, current medications, family history, past medical history, past social history, past surgical history and problem list were reviewed and updated as appropriate.   Previously (copied from previous notes for reference):   06/15/2017: (He) reports snoring and excessive daytime somnolence. I reviewed your office note from 04/02/2017. His ophthalmologist, Dr. Katy Fitch, had recommended a sleep study as well. Snoring can be loud, and he may have had witnessed apneas. He has maintained his weight in the past few years. His Epworth sleepiness score is 6 out of 24 today, fatigue score is 20 out of 63. 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 He lives with his wife, he has 2 sons and 2 daughters, 5 grandchildren.  He quit smoking in 1990, has not had alcohol on a regular basis, maybe 2 or 3 times per year at this time, caffeine in the form of coffee, usually 2 cups per day. He has woken up with dry mouth. He has nocturia about once or twice per average night, denies morning headaches or restless leg symptoms. His bedtime is around 10:30 or 11 and wake up time without alarm clock is typically around 5:30 or 6 AM. He had a tonsillectomy as an adult, appendectomy as an adult.   His Past  Medical History Is Significant For: Past Medical History:  Diagnosis Date  . Allergic angioedema   . Allergy   . Atrial flutter ([place])   . Hypertension   . Pneumonia   . RAD (reactive airway disease)     His Past Surgical History Is Significant For: Past Surgical History:  Procedure Laterality Date  . APPENDECTOMY    . CHOLECYSTECTOMY    . COLONOSCOPY    . stress myoview  12/13/2008   EF 61% LV  normal  . TONSILLECTOMY      His Family History Is Significant For: Family History  Problem Relation Age of Onset  . Pancreatic cancer Mother   . Hypertension Mother   . Cancer Mother   . Hypertension Sister   . Asthma Son   . Stroke Father   . Hypertension Father   . Hypertension Brother   . Hypertension Daughter   . Cancer Maternal Grandfather     His Social History Is Significant For: Social History   Socioeconomic History  . Marital status: Married    Spouse name: None  . Number of children: None  . Years of education: None  . Highest education level: None  Social Needs  . Financial resource strain: None  . Food insecurity - worry: None  . Food insecurity - inability: None  . Transportation needs - medical: None  . Transportation needs - non-medical: None  Occupational History  . None  Tobacco Use  . Smoking status: Former Smoker    Types: Cigarettes, Pipe    Last attempt to quit: 10/05/1996    Years since quitting: 21.1  . Smokeless tobacco: Former Systems developer    Types: Chew  . Tobacco comment: Occasionally  Substance and Sexual Activity  . Alcohol use: No    Alcohol/week: 0.0 oz  . Drug use: No  . Sexual activity: Yes    Birth control/protection: None  Other Topics Concern  . None  Social History Narrative  . None    His Allergies Are:  Allergies  Allergen Reactions  . Ibuprofen Swelling  . Lisinopril Swelling    Swelling of face and lips   . Oxaprozin Swelling    Swelling of face and lips  . Ziac [Bisoprolol-Hydrochlorothiazide] Swelling     Per patient records from Dr. Florina Ou.  Thayer Jew Hcl] Itching  . Hctz [Hydrochlorothiazide]   . Norvasc [Amlodipine Besylate]   :   His Current Medications Are:  Outpatient Encounter Medications as of 11/25/2017  Medication Sig  . cetirizine (ZYRTEC) 10 MG tablet Take 10 mg by mouth daily.  . chlorthalidone (HYGROTON) 25 MG tablet Take 1 tablet (25 mg total) by mouth daily.  Marland Kitchen diltiazem (CARDIZEM CD) 300 MG 24 hr capsule TAKE 1 CAPSULE (300 MG TOTAL) BY MOUTH DAILY.  Marland Kitchen diltiazem (CARDIZEM CD) 300 MG 24 hr capsule Take 1 capsule (300 mg total) by mouth daily. Needs appointment for refills.  Marland Kitchen EPINEPHrine (EPI-PEN)  0.3 mg/0.3 mL DEVI Inject 0.3 mLs (0.3 mg total) into the muscle once.  . metFORMIN (GLUCOPHAGE-XR) 500 MG 24 hr tablet Take 1 tablet (500 mg total) by mouth daily with breakfast.   No facility-administered encounter medications on file as of 11/25/2017.   :  Review of Systems:  Out of a complete 14 point review of systems, all are reviewed and negative with the exception of these symptoms as listed below: Review of Systems  Neurological:       Pt presents today to discuss his cpap. Pt does not like his current mask.    Objective:  Neurological Exam  Physical Exam Physical Examination:   Vitals:   11/25/17 1306  BP: (!) 157/75  Pulse: 87    General Examination: The patient is a very pleasant 71 y.o. male in no acute distress. He appears well-developed and well-nourished and well groomed. Good spirits.  HEENT: Normocephalic, atraumatic, pupils are equal, round and reactive to light and accommodation. Extraocular tracking is good without limitation to gaze excursion or nystagmus noted. Normal smooth pursuit is noted. Hearing is grossly intact. Face is symmetric with normal facial animation and normal facial sensation. Speech is clear with no dysarthria noted. There is no hypophonia. There is no lip, neck/head, jaw or voice tremor. Neck is supple with  full range of passive and active motion. Oropharynx exam reveals: mild mouth dryness, good dental hygiene and moderate airway crowding. Mallampati is class II. Tongue protrudes centrally and palate elevates symmetrically. Tonsils are 2+ in size. Neck size is 18 inches. He has mild redness along the sides of his nose.  Chest: Clear to auscultation without wheezing, rhonchi or crackles noted.  Heart: S1+S2+0, regular and normal without murmurs, rubs or gallops noted.   Abdomen: Soft, non-tender and non-distended with normal bowel sounds appreciated on auscultation.  Extremities: There is no pitting edema in the distal lower extremities bilaterally. Pedal pulses are intact.  Skin: Warm and dry without trophic changes noted. There are no varicose veins.  Musculoskeletal: exam reveals no obvious joint deformities, tenderness or joint swelling or erythema.   Neurologically:  Mental status: The patient is awake, alert and oriented in all 4 spheres. His immediate and remote memory, attention, language skills and fund of knowledge are appropriate. There is no evidence of aphasia, agnosia, apraxia or anomia. Speech is clear with normal prosody and enunciation. Thought process is linear. Mood is normal and affect is normal.  Cranial nerves II - XII are as described above under HEENT exam. In addition: shoulder shrug is normal with equal shoulder height noted. Motor exam: Normal bulk, strength and tone is noted. There is no drift, tremor or rebound. Fine motor skills and coordination: grossly intact.  Cerebellar testing: No dysmetria or intention tremor. There is no truncal or gait ataxia.  Sensory exam: intact to light touch in the upper and lower extremities.  Gait, station and balance: He stands easily. No veering to one side is noted. No leaning to one side is noted. Posture is age-appropriate and stance is narrow based. Gait shows normal stride length and normal pace. No problems turning are  noted.                Assessment and Plan:   In summary, [name] is a very pleasant 71 year old male with an underlying medical history of allergies, atrial flutter, hypertension, reactive airways disease, history of angioedema, hyperlipidemia, type 2 diabetes, and obesity, who presents for follow-up consultation of his  obstructive sleep apnea after sleep study testing. We talked about his baseline sleep study results from 08/09/2017 as well as his CPAP study results from 09/13/2017. We mutually reviewed his compliance data. He has had some issues with the machine not registering initial usage and he was struggling with the mask as well. He is commended for trying CPAP as well as he has so far. Nevertheless, we will need one more brief appointment for compliance recheck in about 4-5 weeks. He is willing to do that. He is also advised to stop in the sleep lab to see if we can help him with his mask issues. His physical exam is stable. I will see him back in January. I answered all his questions today and he was in agreement.  I spent 25 minutes in total face-to-face time with the patient, more than 50% of which was spent in counseling and coordination of care, reviewing test results, reviewing medication and discussing or reviewing the diagnosis of OSA, its prognosis and treatment options. Pertinent laboratory and imaging test results that were available during this visit with the patient were reviewed by me and considered in my medical decision making (see chart for details).

## 2017-11-25 NOTE — Patient Instructions (Signed)
Please continue using your CPAP regularly. While your insurance requires that you use CPAP at least 4 hours each night on 70% of the nights, I recommend, that you not skip any nights and use it throughout the night if you can. Getting used to CPAP and staying with the treatment long term does take time and patience and discipline. Untreated obstructive sleep apnea when it is moderate to severe can have an adverse impact on cardiovascular health and raise her risk for heart disease, arrhythmias, hypertension, congestive heart failure, stroke and diabetes. Untreated obstructive sleep apnea causes sleep disruption, nonrestorative sleep, and sleep deprivation. This can have an impact on your day to day functioning and cause daytime sleepiness and impairment of cognitive function, memory loss, mood disturbance, and problems focussing. Using CPAP regularly can improve these symptoms.  Please come back for your appt in Jan for a "compliance recheck'. 01/05/18 at 8 AM.  Also, please meet with Robin on your way out for a mask refit/troubleshooting.

## 2017-12-10 DIAGNOSIS — L821 Other seborrheic keratosis: Secondary | ICD-10-CM | POA: Diagnosis not present

## 2017-12-10 DIAGNOSIS — Z85828 Personal history of other malignant neoplasm of skin: Secondary | ICD-10-CM | POA: Diagnosis not present

## 2017-12-10 DIAGNOSIS — L570 Actinic keratosis: Secondary | ICD-10-CM | POA: Diagnosis not present

## 2017-12-20 DIAGNOSIS — G4733 Obstructive sleep apnea (adult) (pediatric): Secondary | ICD-10-CM | POA: Diagnosis not present

## 2018-01-03 ENCOUNTER — Encounter: Payer: Self-pay | Admitting: Adult Health

## 2018-01-05 ENCOUNTER — Ambulatory Visit: Payer: Medicare HMO | Admitting: Neurology

## 2018-01-05 ENCOUNTER — Encounter: Payer: Self-pay | Admitting: Neurology

## 2018-01-05 VITALS — BP 160/87 | HR 91 | Ht 74.0 in | Wt 261.0 lb

## 2018-01-05 DIAGNOSIS — Z9989 Dependence on other enabling machines and devices: Secondary | ICD-10-CM | POA: Diagnosis not present

## 2018-01-05 DIAGNOSIS — G4733 Obstructive sleep apnea (adult) (pediatric): Secondary | ICD-10-CM

## 2018-01-05 NOTE — Progress Notes (Signed)
Subjective:    Patient ID: EUCLID CASSETTA is a 72 y.o. male.  HPI     Interim history:   Mr. Vickerman is a 72 year old right-handed gentleman with an underlying medical history of allergies, atrial flutter, hypertension, reactive airways disease, history of angioedema, hyperlipidemia, type 2 diabetes, and obesity, who presents for follow-up consultation of his obstructive sleep apnea, repeat compliance check after about a month. The patient is unaccompanied today. I last saw him on 11/25/2017, at which time we talked about his sleep study results from his baseline sleep study from 08/09/2017 as well as his CPAP titration results from 09/13/2017. He was not quite compliant with CPAP therapy. He also had some issues with the machine in the first few days of treatment. I suggested he meet with our sleep lab manager for a mask refit and also for a 6 week follow-up to recheck on his compliance.  Today, 01/05/2018: I reviewed his CPAP compliance data from 12/05/2017 through 01/03/2018 which is a total of 30 days, during which time he used his CPAP every night with percent used days greater than 4 hours at 93%, indicating excellent compliance with an average usage of 5 hours and 12 minutes, residual AHI 3 per hour, leaked high with the 95th percentile of 45 L/m on a pressure of 9 cm with EPR of 3. He reports feeling better. He is feeling more rested, he is pleased with the nasal pillows but does have some discomfort by the end of the night or early morning and sometimes takes the pillows off briefly or takes the mask off completely. He is not a long sleeper to begin with. He has not been sleeping 7 or 8 hours typically. He still works. He is not aware of any air escaping, wife has not complained about any whistling or hissing sounds but he does have a dry mouth. This is not necessarily a new problem for him and has not necessarily become worse either.   The patient's allergies, current medications, family  history, past medical history, past social history, past surgical history and problem list were reviewed and updated as appropriate.    Previously (copied from previous notes for reference):   I first met him on 06/15/2017 at the request of his primary care physician, at which time he reported snoring and daytime somnolence. He was advised to proceed with sleep study testing. He had a baseline sleep study, followed by a CPAP titration study. I went over his test results with him in detail today. Baseline sleep study from 08/09/2017 showed a sleep efficiency of 74.1%, sleep latency of 13.5 minutes and REM latency of 86.5 minutes. He had absence of slow-wave sleep, an increased percentage of stage II and stage I sleep, and near normal percentage of REM sleep. Overall AHI was 7.5 per hour, REM AHI 17.8 per hour, supine AHI was 7.8 per hour. Average oxygen saturation was 97%, nadir was 83%. He had no significant PLMS. I advised him to proceed with a CPAP titration study. He had this on 09/13/2017. Sleep efficiency was 70.7%, sleep latency 14.5 minutes, REM latency was 95.5 minutes. He had absence of slow-wave sleep and REM sleep was 20.2%. He was fitted with a nasal mask and CPAP was titrated from 5 cm to 9 cm. On the final pressure his AHI was 0 per hour with supine REM sleep achieved, O2 nadir of 91%. He had no significant PLMS. Based on his test results I prescribed CPAP therapy for home use.  I reviewed his CPAP compliance data from 10/26/2017 through 11/24/2017 which is a total of 30 days, during which time he used his machine 24 days with percent used days greater than 4 hours at 57%, indicating suboptimal compliance with an average usage of 4 hours and 33 minutes for days on treatment. Residual AHI borderline at 4.4 per hour, leak on the high side with the 95th percentile at 34.3 L/m on a pressure of 9 cm with EPR of 3.    06/15/2017: (He) reports snoring and excessive daytime somnolence. I reviewed your  office note from 04/02/2017. His ophthalmologist, Dr. Katy Fitch, had recommended a sleep study as well. Snoring can be loud, and he may have had witnessed apneas. He has maintained his weight in the past few years. His Epworth sleepiness score is 6 out of 24 today, fatigue score is 20 out of 63. 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 He lives with his wife, he has 2 sons and 2 daughters, 5 grandchildren. He quit smoking in 1990, has not had alcohol on a regular basis, maybe 2 or 3 times per year at this time, caffeine in the form of coffee, usually 2 cups per day. He has woken up with dry mouth. He has nocturia about once or twice per average night, denies morning headaches or restless leg symptoms. His bedtime is around 10:30 or 11 and wake up time without alarm clock is typically around 5:30 or 6 AM. He had a tonsillectomy as an adult, appendectomy as an adult.  His Past Medical History Is Significant For: Past Medical History:  Diagnosis Date  . Allergic angioedema   . Allergy   . Atrial flutter ([place])   . Hypertension   . Pneumonia   . RAD (reactive airway disease)     His Past Surgical History Is Significant For: Past Surgical History:  Procedure Laterality Date  . APPENDECTOMY    . CHOLECYSTECTOMY    . COLONOSCOPY    . stress myoview  12/13/2008   EF 61% LV  normal  . TONSILLECTOMY      His Family History Is Significant For: Family History  Problem Relation Age of Onset  . Pancreatic cancer Mother   . Hypertension Mother   . Cancer Mother   . Hypertension Sister   . Asthma Son   . Stroke Father   . Hypertension Father   . Hypertension Brother   . Hypertension Daughter   . Cancer Maternal Grandfather     His Social History Is Significant For: Social History   Socioeconomic History  . Marital status: Married    Spouse name: None  . Number of children: None  . Years of education: None  . Highest education level: None  Social Needs  . Financial resource  strain: None  . Food insecurity - worry: None  . Food insecurity - inability: None  . Transportation needs - medical: None  . Transportation needs - non-medical: None  Occupational History  . None  Tobacco Use  . Smoking status: Former Smoker    Types: Cigarettes, Pipe    Last attempt to quit: 10/05/1996    Years since quitting: 21.2  . Smokeless tobacco: Former Systems developer    Types: Chew  . Tobacco comment: Occasionally  Substance and Sexual Activity  . Alcohol use: No    Alcohol/week: 0.0 oz  . Drug use: No  . Sexual activity: Yes    Birth control/protection: None  Other Topics Concern  . None  Social  History Narrative  . None    His Allergies Are:  Allergies  Allergen Reactions  . Ibuprofen Swelling  . Lisinopril Swelling    Swelling of face and lips   . Oxaprozin Swelling    Swelling of face and lips  . Ziac [Bisoprolol-Hydrochlorothiazide] Swelling    Per patient records from Dr. Florina Ou.  Thayer Jew Hcl] Itching  . Hctz [Hydrochlorothiazide]   . Norvasc [Amlodipine Besylate]   :   His Current Medications Are:  Outpatient Encounter Medications as of 01/05/2018  Medication Sig  . cetirizine (ZYRTEC) 10 MG tablet Take 10 mg by mouth daily.  . chlorthalidone (HYGROTON) 25 MG tablet Take 1 tablet (25 mg total) by mouth daily.  Marland Kitchen diltiazem (CARDIZEM CD) 300 MG 24 hr capsule TAKE 1 CAPSULE (300 MG TOTAL) BY MOUTH DAILY.  Marland Kitchen diltiazem (CARDIZEM CD) 300 MG 24 hr capsule Take 1 capsule (300 mg total) by mouth daily. Needs appointment for refills.  Marland Kitchen EPINEPHrine (EPI-PEN) 0.3 mg/0.3 mL DEVI Inject 0.3 mLs (0.3 mg total) into the muscle once.  . metFORMIN (GLUCOPHAGE-XR) 500 MG 24 hr tablet Take 1 tablet (500 mg total) by mouth daily with breakfast.   No facility-administered encounter medications on file as of 01/05/2018.   :  Review of Systems:  Out of a complete 14 point review of systems, all are reviewed and negative with the exception of these symptoms as  listed below:  Review of Systems  Neurological:       Pt presents today to discuss his cpap. Pt feels that his cpap is going much better now that he has the nasal pillow mask.    Objective:  Neurological Exam  Physical Exam Physical Examination:   Vitals:   01/05/18 0743  BP: (!) 160/87  Pulse: 91   General Examination: The patient is a very pleasant 72 y.o. male in no acute distress. He appears well-developed and well-nourished and well groomed.   HEENT:Normocephalic, atraumatic, pupils are equal, round and reactive to light and accommodation. Extraocular tracking is good without limitation to gaze excursion or nystagmus noted. Normal smooth pursuit is noted. Hearing is grossly intact. Face is symmetric with normal facial animation and normal facial sensation. Speech is clear with no dysarthria noted. There is no hypophonia. There is no lip, neck/head, jaw or voice tremor.Oropharynx exam reveals: mildmouth dryness, gooddental hygiene and moderateairway crowding. Mallampati is classII. Tongue protrudes centrally and palate elevates symmetrically. Tonsils are 2+ in size. He has minimal redness along the medial aspect of the right nostril.  Chest:Clear to auscultation without wheezing, rhonchi or crackles noted.  Heart:S1+S2+0, regular and normal without murmurs, rubs or gallops noted.   Abdomen:Soft, non-tender and non-distended with normal bowel sounds appreciated on auscultation.  Extremities:There isnopitting edema in the distal lower extremities bilaterally. Pedal pulses are intact.  Skin: Warm and dry without trophic changes noted. There are novaricose veins.  Musculoskeletal: exam reveals no obvious joint deformities, tenderness or joint swelling or erythema.   Neurologically:  Mental status: The patient is awake, alert and oriented in all 4 spheres.Hisimmediate and remote memory, attention, language skills and fund of knowledge are appropriate. There is no  evidence of aphasia, agnosia, apraxia or anomia. Speech is clear with normal prosody and enunciation. Thought process is linear. Mood is normaland affect is normal.  Cranial nerves II - XII are as described above under HEENT exam. In addition: shoulder shrug is normal with equal shoulder height noted. Motor exam: Normal bulk, strength and tone  is noted. There is no drift or tremor. Fine motor skills and coordination: grossly intact.  Cerebellar testing: No dysmetria or intention tremor. There is no truncal or gait ataxia.  Sensory exam: intact to light touch in the upper and lower extremities.  Gait, station and balance:Hestands easily. No veering to one side is noted. No leaning to one side is noted. Posture is age-appropriate and stance is narrow based. Gait showsnormalstride length and normalpace. No problems turning are noted.    Assessmentand Plan:   In summary,Winn B Higginsis a very pleasant 45 year oldmalewith an underlying medical history of allergies, atrial flutter, hypertension, reactive airways disease, history of angioedema, hyperlipidemia, type 2 diabetes, and obesity, who presents for follow-up consultation of his obstructive sleep apnea for recheck of his most recent compliance with PAP therapy. We have previously discussed his baseline sleep study results from 08/09/2017 as well as his CPAP study results from 09/13/2017. We mutually reviewed his most recent compliance data. He has had initially some issues with the machine not registering initial usage and he was struggling with the mask as well. He is now doing better with the nasal pillows interface and feels better as well. He is commended for his CPAP adherence, currently over 90% compliant for over 4 hours of usage.his air leak from the mask is consistently high, this could be due to mouth opening at night. I would like to prescribe a chinstrap. He is willing to try it. He is using large nasal pillows, I  have asked his DME provider to check on the size of his pillows as well. His physical exam is stable. at this juncture, we can see him back in 6 months, he can see one of our nurse practitioners next time, if he continues to do well we can see him yearly thereafter. I answered all his questions today and he was in agreement. I spent 20 minutes in total face-to-face time with the patient, more than 50% of which was spent in counseling and coordination of care, reviewing test results, reviewing medication and discussing or reviewing the diagnosis of OSA, its prognosis and treatment options. Pertinent laboratory and imaging test results that were available during this visit with the patient were reviewed by me and considered in my medical decision making (see chart for details).

## 2018-01-05 NOTE — Patient Instructions (Signed)
You have done much better with your CPAP and I am pleased to hear you feel improved too. Keep up the good work! We will see you back in 6 months for sleep apnea check up. You can see one of our nurse practitioners next time. I will see you after that.

## 2018-01-07 ENCOUNTER — Encounter: Payer: Self-pay | Admitting: Family Medicine

## 2018-01-07 ENCOUNTER — Ambulatory Visit (INDEPENDENT_AMBULATORY_CARE_PROVIDER_SITE_OTHER): Payer: Medicare HMO | Admitting: Family Medicine

## 2018-01-07 ENCOUNTER — Other Ambulatory Visit: Payer: Self-pay

## 2018-01-07 VITALS — BP 120/60 | HR 97 | Temp 98.8°F | Ht 74.0 in | Wt 261.4 lb

## 2018-01-07 DIAGNOSIS — J029 Acute pharyngitis, unspecified: Secondary | ICD-10-CM

## 2018-01-07 LAB — POCT RAPID STREP A (OFFICE): Rapid Strep A Screen: NEGATIVE

## 2018-01-07 NOTE — Progress Notes (Signed)
1/24/201911:28 AM  [name] Jan 19, 1946, 72 y.o. male [id number]  Chief Complaint  Patient presents with  . Sore Throat    FOR THE PST 2 DAYS    HPI:   Patient is a 72 y.o. male who presents today for 2 days of sore throat and nasal congestion. Denies any ear pain, cough, sob, fever, chills, nausea, vomiting, diarrhea or rash.   Depression screen [facility] 2/9 01/07/2018 06/18/2016 03/20/2016  Decreased Interest 0 0 0  Down, Depressed, Hopeless 0 0 0  PHQ - 2 Score 0 0 0  Altered sleeping - - -  Tired, decreased energy - - -  Change in appetite - - -  Feeling bad or failure about yourself  - - -  Trouble concentrating - - -  Moving slowly or fidgety/restless - - -  Suicidal thoughts - - -  PHQ-9 Score - - -  Difficult doing work/chores - - -    Allergies  Allergen Reactions  . Ibuprofen Swelling  . Lisinopril Swelling    Swelling of face and lips   . Oxaprozin Swelling    Swelling of face and lips  . Ziac [Bisoprolol-Hydrochlorothiazide] Swelling    Per patient records from Dr. Florina Ou.  Thayer Jew Hcl] Itching  . Hctz [Hydrochlorothiazide]   . Norvasc [Amlodipine Besylate]     Prior to Admission medications   Medication Sig Start Date End Date Taking? Authorizing Provider  cetirizine (ZYRTEC) 10 MG tablet Take 10 mg by mouth daily.    [provider]  chlorthalidone (HYGROTON) 25 MG tablet Take 1 tablet (25 mg total) by mouth daily. 04/02/17   Copland, Gay Filler, MD  diltiazem (CARDIZEM CD) 300 MG 24 hr capsule TAKE 1 CAPSULE (300 MG TOTAL) BY MOUTH DAILY. 04/02/17   Copland, Gay Filler, MD  diltiazem (CARDIZEM CD) 300 MG 24 hr capsule Take 1 capsule (300 mg total) by mouth daily. Needs appointment for refills. 04/06/17   Hilty, Nadean Corwin, MD  EPINEPHrine (EPI-PEN) 0.3 mg/0.3 mL DEVI Inject 0.3 mLs (0.3 mg total) into the muscle once. 10/19/12   Theda Sers, PA-C  metFORMIN (GLUCOPHAGE-XR) 500 MG 24 hr tablet Take 1 tablet (500 mg total)  by mouth daily with breakfast. 04/02/17   Copland, Gay Filler, MD    Past Medical History:  Diagnosis Date  . Allergic angioedema   . Allergy   . Atrial flutter ([place])   . Hypertension   . Pneumonia   . RAD (reactive airway disease)     Past Surgical History:  Procedure Laterality Date  . APPENDECTOMY    . CHOLECYSTECTOMY    . COLONOSCOPY    . stress myoview  12/13/2008   EF 61% LV  normal  . TONSILLECTOMY      Social History   Tobacco Use  . Smoking status: Former Smoker    Types: Cigarettes, Pipe    Last attempt to quit: 10/05/1996    Years since quitting: 21.2  . Smokeless tobacco: Former Systems developer    Types: Chew  . Tobacco comment: Occasionally  Substance Use Topics  . Alcohol use: No    Alcohol/week: 0.0 oz    Family History  Problem Relation Age of Onset  . Pancreatic cancer Mother   . Hypertension Mother   . Cancer Mother   . Hypertension Sister   . Asthma Son   . Stroke Father   . Hypertension Father   . Hypertension Brother   . Hypertension Daughter   .  Cancer Maternal Grandfather     ROS Per hpi  OBJECTIVE:  Blood pressure 120/60, pulse 97, temperature 98.8 F (37.1 C), temperature source Oral, height 6\' 2"  (1.88 m), weight 261 lb 6.4 oz (118.6 kg), SpO2 100 %.  Physical Exam  Constitutional: He is oriented to person, place, and time and well-developed, well-nourished, and in no distress.  HENT:  Head: Normocephalic and atraumatic.  Right Ear: Hearing, tympanic membrane, external ear and ear canal normal.  Left Ear: Hearing, tympanic membrane, external ear and ear canal normal.  Mouth/Throat: Posterior oropharyngeal erythema present. No oropharyngeal exudate.  Eyes: Conjunctivae and EOM are normal. Pupils are equal, round, and reactive to light.  Neck: Neck supple.  Cardiovascular: Normal rate and regular rhythm. Exam reveals no gallop and no friction rub.  No murmur heard. Pulmonary/Chest: Effort normal and breath sounds normal. He has no  wheezes. He has no rales.  Lymphadenopathy:    He has no cervical adenopathy.  Neurological: He is alert and oriented to person, place, and time. Gait normal.  Skin: Skin is warm and dry.     Results for orders placed or performed in visit on 01/07/18 (from the past 24 hour(s))  POCT rapid strep A     Status: None   Collection Time: 01/07/18 11:06 AM  Result Value Ref Range   Rapid Strep A Screen Negative Negative     ASSESSMENT and PLAN  1. Sore throat Discussed supportive measures for URI: increase hydration, rest, OTC medications, etc. RTC precautions discussed. - POCT rapid strep A  Return if symptoms worsen or fail to improve.    Rutherford Guys, MD [facility] [place], [place] [address] Ph.  [phone] Fax [phone]

## 2018-01-07 NOTE — Patient Instructions (Addendum)
     IF you received an x-ray today, you will receive an invoice from [facility]. Please contact [facility] at [phone] with questions or concerns regarding your invoice.   IF you received labwork today, you will receive an invoice from [facility]. Please contact [facility] at [phone] with questions or concerns regarding your invoice.   Our billing staff will not be able to assist you with questions regarding bills from these companies.  You will be contacted with the lab results as soon as they are available. The fastest way to get your results is to activate your My Chart account. Instructions are located on the last page of this paperwork. If you have not heard from us regarding the results in 2 weeks, please contact this office.     Pharyngitis Pharyngitis is a sore throat (pharynx). There is redness, pain, and swelling of your throat. Follow these instructions at home:  Drink enough fluids to keep your pee (urine) clear or pale yellow.  Only take medicine as told by your doctor.  You may get sick again if you do not take medicine as told. Finish your medicines, even if you start to feel better.  Do not take aspirin.  Rest.  Rinse your mouth (gargle) with salt water ( tsp of salt per 1 qt of water) every 1-2 hours. This will help the pain.  If you are not at risk for choking, you can suck on hard candy or sore throat lozenges. Contact a doctor if:  You have large, tender lumps on your neck.  You have a rash.  You cough up green, yellow-brown, or bloody spit. Get help right away if:  You have a stiff neck.  You drool or cannot swallow liquids.  You throw up (vomit) or are not able to keep medicine or liquids down.  You have very bad pain that does not go away with medicine.  You have problems breathing (not from a stuffy nose). This information is not intended to replace advice given to you by your health care provider. Make sure you  discuss any questions you have with your health care provider. Document Released: 05/19/2008 Document Revised: 05/08/2016 Document Reviewed: 08/08/2013 Elsevier Interactive Patient Education  2017 [employer] Inc.  

## 2018-01-20 DIAGNOSIS — G4733 Obstructive sleep apnea (adult) (pediatric): Secondary | ICD-10-CM | POA: Diagnosis not present

## 2018-02-02 DIAGNOSIS — G4733 Obstructive sleep apnea (adult) (pediatric): Secondary | ICD-10-CM | POA: Diagnosis not present

## 2018-02-12 ENCOUNTER — Ambulatory Visit: Payer: Medicare HMO | Admitting: Family Medicine

## 2018-02-13 ENCOUNTER — Ambulatory Visit (INDEPENDENT_AMBULATORY_CARE_PROVIDER_SITE_OTHER): Payer: Medicare HMO | Admitting: Family Medicine

## 2018-02-13 ENCOUNTER — Encounter: Payer: Self-pay | Admitting: Family Medicine

## 2018-02-13 ENCOUNTER — Other Ambulatory Visit: Payer: Self-pay

## 2018-02-13 VITALS — BP 137/78 | HR 84 | Temp 99.2°F | Resp 17 | Ht 74.0 in | Wt 260.2 lb

## 2018-02-13 DIAGNOSIS — I10 Essential (primary) hypertension: Secondary | ICD-10-CM

## 2018-02-13 DIAGNOSIS — R05 Cough: Secondary | ICD-10-CM

## 2018-02-13 DIAGNOSIS — R6889 Other general symptoms and signs: Secondary | ICD-10-CM

## 2018-02-13 DIAGNOSIS — J101 Influenza due to other identified influenza virus with other respiratory manifestations: Secondary | ICD-10-CM | POA: Diagnosis not present

## 2018-02-13 DIAGNOSIS — R059 Cough, unspecified: Secondary | ICD-10-CM

## 2018-02-13 LAB — POC INFLUENZA A&B (BINAX/QUICKVUE)
INFLUENZA A, POC: POSITIVE — AB
INFLUENZA B, POC: NEGATIVE

## 2018-02-13 MED ORDER — OSELTAMIVIR PHOSPHATE 75 MG PO CAPS: 75.0000 mg | ORAL_CAPSULE | Freq: Two times a day (BID) | ORAL | 0 refills | Status: AC

## 2018-02-13 NOTE — Patient Instructions (Addendum)
If you have thyroid disease, diabetes, hypertension, tachycardia or seizure disorder If you take heart meds or ADD meds   You should not take Dextromorphan or Phenylephrine These are common found in decongestant and cold medications This can cause very high pulses and high blood pressure.  Ask you pharmacist to make sure this ingredient is not present     IF you received an x-ray today, you will receive an invoice from [facility]. Please contact [facility] at [phone] with questions or concerns regarding your invoice.   IF you received labwork today, you will receive an invoice from [facility]. Please contact [facility] at [phone] with questions or concerns regarding your invoice.   Our billing staff will not be able to assist you with questions regarding bills from these companies.  You will be contacted with the lab results as soon as they are available. The fastest way to get your results is to activate your My Chart account. Instructions are located on the last page of this paperwork. If you have not heard from [place] regarding the results in 2 weeks, please contact this office.     Influenza, Adult Influenza ("the flu") is an infection in the lungs, nose, and throat (respiratory tract). It is caused by a virus. The flu causes many common cold symptoms, as well as a high fever and body aches. It can make you feel very sick. The flu spreads easily from person to person (is contagious). Getting a flu shot (influenza vaccination) every year is the best way to prevent the flu. Follow these instructions at home:  Take over-the-counter and prescription medicines only as told by your doctor.  Use a cool mist humidifier to add moisture (humidity) to the air in your home. This can make it easier to breathe.  Rest as needed.  Drink enough fluid to keep your pee (urine) clear or pale yellow.  Cover your mouth and nose when you cough or sneeze.  Wash your hands with  soap and water often, especially after you cough or sneeze. If you cannot use soap and water, use hand sanitizer.  Stay home from work or school as told by your doctor. Unless you are visiting your doctor, try to avoid leaving home until your fever has been gone for 24 hours without the use of medicine.  Keep all follow-up visits as told by your doctor. This is important. How is this prevented?  Getting a yearly (annual) flu shot is the best way to avoid getting the flu. You may get the flu shot in late summer, fall, or winter. Ask your doctor when you should get your flu shot.  Wash your hands often or use hand sanitizer often.  Avoid contact with people who are sick during cold and flu season.  Eat healthy foods.  Drink plenty of fluids.  Get enough sleep.  Exercise regularly. Contact a doctor if:  You get new symptoms.  You have: ? Chest pain. ? Watery poop (diarrhea). ? A fever.  Your cough gets worse.  You start to have more mucus.  You feel sick to your stomach (nauseous).  You throw up (vomit). Get help right away if:  You start to be short of breath or have trouble breathing.  Your skin or nails turn a bluish color.  You have very bad pain or stiffness in your neck.  You get a sudden headache.  You get sudden pain in your face or ear.  You cannot stop throwing up. This information is  not intended to replace advice given to you by your health care provider. Make sure you discuss any questions you have with your health care provider. Document Released: 09/09/2008 Document Revised: 05/08/2016 Document Reviewed: 09/25/2015 Elsevier Interactive Patient Education  2017 [employer].

## 2018-02-13 NOTE — Progress Notes (Signed)
Chief Complaint  Patient presents with  . Influenza    onset: Thursday night, fevers, bodyache chills, cough, st and using tylenol for fevers- last taken 3 hours ago, temp of 102 this morning    HPI Flu like symptoms Hypertension He has been having labile temperatures with fevers and chills tmax 102 He reports that symptoms started Thursday 02/11/18, 2 days ago  He states that he is drinking plenty of fluids He has a history of hypertension and wanted to be seen since his wife has an upcoming kidney surgery and he did not want her to get infected.  He is not taking any OTC meds that might affect his blood pressure and did not know what he could take He feels like he is sweaty and alternating with cold shivers but has been hydrated. No chest pains, palpitations or shortness of breath.  BP Readings from Last 3 Encounters:  02/13/18 137/78  01/07/18 120/60  01/05/18 (!) 160/87      Past Medical History:  Diagnosis Date  . Allergic angioedema   . Allergy   . Atrial flutter ([place])   . Hypertension   . Pneumonia   . RAD (reactive airway disease)     Current Outpatient Medications  Medication Sig Dispense Refill  . cetirizine (ZYRTEC) 10 MG tablet Take 10 mg by mouth daily.    . chlorthalidone (HYGROTON) 25 MG tablet Take 1 tablet (25 mg total) by mouth daily. 90 tablet 3  . diltiazem (CARDIZEM CD) 300 MG 24 hr capsule TAKE 1 CAPSULE (300 MG TOTAL) BY MOUTH DAILY. 90 capsule 3  . diltiazem (CARDIZEM CD) 300 MG 24 hr capsule Take 1 capsule (300 mg total) by mouth daily. Needs appointment for refills. 30 capsule 1  . EPINEPHrine (EPI-PEN) 0.3 mg/0.3 mL DEVI Inject 0.3 mLs (0.3 mg total) into the muscle once. 1 Device 1  . metFORMIN (GLUCOPHAGE-XR) 500 MG 24 hr tablet Take 1 tablet (500 mg total) by mouth daily with breakfast. 90 tablet 3  . oseltamivir (TAMIFLU) 75 MG capsule Take 1 capsule (75 mg total) by mouth 2 (two) times daily for 5 days. 10 capsule 0   No current  facility-administered medications for this visit.     Allergies:  Allergies  Allergen Reactions  . Ibuprofen Swelling  . Lisinopril Swelling    Swelling of face and lips   . Oxaprozin Swelling    Swelling of face and lips  . Ziac [Bisoprolol-Hydrochlorothiazide] Swelling    Per patient records from Dr. Florina Ou.  Thayer Jew Hcl] Itching  . Hctz [Hydrochlorothiazide]   . Norvasc [Amlodipine Besylate]     Past Surgical History:  Procedure Laterality Date  . APPENDECTOMY    . CHOLECYSTECTOMY    . COLONOSCOPY    . stress myoview  12/13/2008   EF 61% LV  normal  . TONSILLECTOMY      Social History   Socioeconomic History  . Marital status: Married    Spouse name: None  . Number of children: None  . Years of education: None  . Highest education level: None  Social Needs  . Financial resource strain: None  . Food insecurity - worry: None  . Food insecurity - inability: None  . Transportation needs - medical: None  . Transportation needs - non-medical: None  Occupational History  . None  Tobacco Use  . Smoking status: Former Smoker    Types: Cigarettes, Pipe    Last attempt to quit: 10/05/1996    Years  since quitting: 21.3  . Smokeless tobacco: Former Systems developer    Types: Chew  . Tobacco comment: Occasionally  Substance and Sexual Activity  . Alcohol use: No    Alcohol/week: 0.0 oz  . Drug use: No  . Sexual activity: Yes    Birth control/protection: None  Other Topics Concern  . None  Social History Narrative  . None    Family History  Problem Relation Age of Onset  . Pancreatic cancer Mother   . Hypertension Mother   . Cancer Mother   . Hypertension Sister   . Asthma Son   . Stroke Father   . Hypertension Father   . Hypertension Brother   . Hypertension Daughter   . Cancer Maternal Grandfather      ROS Review of Systems See HPI Skin: No rash or itching Eyes: no blurry vision, no double vision GU: no dysuria or hematuria GI: no  nausea, vomiting or diarrhea Neuro: no dizziness or headaches all others reviewed and negative   Objective: Vitals:   02/13/18 1215  BP: 137/78  Pulse: 84  Resp: 17  Temp: 99.2 F (37.3 C)  TempSrc: Oral  SpO2: 96%  Weight: 260 lb 3.2 oz (118 kg)  Height: 6\' 2"  (1.88 m)    Physical Exam General: alert, oriented, in NAD Head: normocephalic, atraumatic, no sinus tenderness Eyes: EOM intact, no scleral icterus or conjunctival injection Ears: TM clear bilaterally Nose: mucosa nonerythematous, nonedematous Throat: no pharyngeal exudate or erythema Lymph: no posterior auricular, submental or cervical lymph adenopathy Heart: normal rate, normal sinus rhythm, no murmurs Lungs: clear to auscultation bilaterally, no wheezing   Assessment and Plan Christ was seen today for influenza.  Diagnoses and all orders for this visit:  Flu-like symptoms -     POC Influenza A&B(BINAX/QUICKVUE)  Cough Influenza A Discussed Tamiflu Advised that his wife should call her PCP for tamiflu prophylactic dose   Essential hypertension- advised to avoid otc meds with dextromorphan or phylephrine  Other orders -     Cancel: POCT glycosylated hemoglobin (Hb A1C) -     Cancel: Microalbumin, urine -     oseltamivir (TAMIFLU) 75 MG capsule; Take 1 capsule (75 mg total) by mouth 2 (two) times daily for 5 days.     [place]

## 2018-02-17 DIAGNOSIS — G4733 Obstructive sleep apnea (adult) (pediatric): Secondary | ICD-10-CM | POA: Diagnosis not present

## 2018-03-20 DIAGNOSIS — G4733 Obstructive sleep apnea (adult) (pediatric): Secondary | ICD-10-CM | POA: Diagnosis not present

## 2018-04-03 ENCOUNTER — Other Ambulatory Visit: Payer: Self-pay | Admitting: Family Medicine

## 2018-04-03 DIAGNOSIS — I10 Essential (primary) hypertension: Secondary | ICD-10-CM

## 2018-04-03 DIAGNOSIS — R739 Hyperglycemia, unspecified: Secondary | ICD-10-CM

## 2018-04-05 NOTE — Progress Notes (Signed)
[facility] [address], [place], [place] [address] [phone]  Date:  04/07/2018   Name:  [name]   DOB:  08/26/1946   MRN:  [id number]  PCP:  Darreld Mclean, MD    Chief Complaint: Annual Exam (Pt here for CPE and fasting labs. )   History of Present Illness:  [name] is a 72 y.o. very pleasant male patient who presents with the following:  CPE today History of DM, HTN, paroxysmal a flutter Last seen by myself about a year ago:  Here today as a new patient- he has been a pt of Dr. Everlene Farrier who is now retired, last CPE about a year ago.  History of controlled DM2 on metformin,  dyslipidemia, HTN, atrial flutter Due for pneumovax 23.   Singles vaccine- he has not had yet colonoscopy is UTD  RecentLabs       Lab Results  Component Value Date   HGBA1C 6.3 06/18/2016     He is on metformin He had eggs, toast and coffee this am for breakfast  No recent atrial flutter- has been released from cardiology care at this time  He does have a ventral hernia which does not bother him He does exercise in the gym- cardio, weights. He also does some sports like  No CP or SOB He is married, he has 4 children.  They all live around here.  He does not smoker, does not chew tobacco.  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  He has not noted any sx of atrial flutter   Sadly his wife was recently dx with lymphoma and is getting treatment at [facility] .  They are hopeful that she will do well Besides this stress all is ok in his life right now He does note that he  will sometimes have some pain and tingling in his bilateral feet which he thinks is due to diabetes.  No other neurological sx.  His neuropathy sx are not severe enough for him to want to start on a medication at this time however  Lab Results  Component Value Date   HGBA1C 6.9 (H) 04/02/2017     Patient Active Problem List   Diagnosis Date Noted  . Controlled type 2 diabetes mellitus without complication, without long-term current use of insulin ([place]) 04/02/2017  . Left hip pain 02/25/2016  . Hyperglycemia 12/07/2015  . CMV infection, acute ([place]) 09/11/2015  . Low HDL (under 40) 07/12/2013  . Angioedema 11/08/2012  . Paroxysmal atrial flutter ([place]) 02/04/2012  . Essential hypertension 06/15/2009  . BRONCHOSPASM 06/14/2009    Past Medical History:  Diagnosis Date  . Allergic angioedema   . Allergy   . Atrial flutter ([place])   . Hypertension   . Pneumonia   . RAD (reactive airway disease)     Past Surgical History:  Procedure Laterality Date  . APPENDECTOMY    . CHOLECYSTECTOMY    . COLONOSCOPY    . stress myoview  12/13/2008   EF 61%  LV  normal  . TONSILLECTOMY      Social History   Tobacco Use  . Smoking status: Former Smoker    Types: Cigarettes, Pipe    Last attempt to quit: 10/05/1996    Years since quitting: 21.5  . Smokeless tobacco: Former Systems developer    Types: Chew  . Tobacco comment: Occasionally  Substance Use Topics  . Alcohol use: No    Alcohol/week: 0.0 oz  . Drug use: No    Family History  Problem Relation Age of Onset  . Pancreatic cancer Mother   . Hypertension Mother   . Cancer Mother   . Hypertension Sister   . Asthma Son   . Stroke Father   . Hypertension Father   . Hypertension Brother   . Hypertension Daughter   . Cancer Maternal Grandfather     Allergies  Allergen Reactions  . Ibuprofen Swelling  . Lisinopril Swelling    Swelling of face and lips   . Oxaprozin Swelling    Swelling of face and lips  . Ziac  [Bisoprolol-Hydrochlorothiazide] Swelling    Per patient records from Dr. Florina Ou.  Thayer Jew Hcl] Itching  . Hctz [Hydrochlorothiazide]   . Norvasc [Amlodipine Besylate]     Medication list has been reviewed and updated.  Current Outpatient Medications on File Prior to Visit  Medication Sig Dispense Refill  . cetirizine (ZYRTEC) 10 MG tablet Take 10 mg by mouth daily.    . chlorthalidone (HYGROTON) 25 MG tablet Take 1 tablet (25 mg total) by mouth daily. 90 tablet 3  . diltiazem (CARDIZEM CD) 300 MG 24 hr capsule Take 1 capsule (300 mg total) by mouth daily. Needs appointment for refills. 30 capsule 1  . diltiazem (CARDIZEM CD) 300 MG 24 hr capsule TAKE ONE CAPSULE BY MOUTH EVERY DAY 90 capsule 3  . EPINEPHrine (EPI-PEN) 0.3 mg/0.3 mL DEVI Inject 0.3 mLs (0.3 mg total) into the muscle once. 1 Device 1  . metFORMIN (GLUCOPHAGE-XR) 500 MG 24 hr tablet TAKE 1 TABLET BY MOUTH EVERY DAY WITH BREAKFAST **NEEDS OV W/NEW PCP FOR REFILLS** 90 tablet 3   No current facility-administered medications on file prior to visit.     Review of Systems:  As per HPI- otherwise negative. No fever or chills No CP or SOB   Physical Examination: Vitals:   04/07/18 1003 04/07/18 1014  BP: (!) 159/81 139/79  Pulse: 82   Temp: 98.2 F (36.8 C)   SpO2: 95%    Vitals:   04/07/18 1003  Weight: 259 lb 3.2 oz (117.6 kg)  Height: 6\' 2"  (1.88 m)   Body mass index is 33.28 kg/m. Ideal Body Weight: Weight in (lb) to have BMI = 25: 194.3  GEN: WDWN, NAD, Non-toxic, A & O x 3, tall, muscular build HEENT: Atraumatic, Normocephalic. Neck supple. No masses, No LAD. Ears and Nose: No external deformity. CV: RRR, No M/G/R. No JVD. No thrill. No extra heart sounds. PULM: CTA B, no wheezes, crackles, rhonchi. No retractions. No resp. distress. No accessory muscle use. ABD: S, NT, ND, +BS. No rebound. No HSM. EXTR: No c/c/e NEURO Normal gait.  PSYCH: Normally interactive. Conversant. Not  depressed or anxious appearing.  Calm demeanor.  Foot exam done today  Assessment and Plan: Physical exam  Essential hypertension - Plan: CBC, chlorthalidone (HYGROTON) 25 MG tablet  Dyslipidemia - Plan: Lipid panel  Screening for deficiency anemia - Plan: CBC  Screening for prostate cancer - Plan: PSA  Controlled type  2 diabetes mellitus without complication, without long-term current use of insulin ([place]) - Plan: Comprehensive metabolic panel, Hemoglobin A1c, Microalbumin / creatinine urine ratio  Hyperglycemia - Plan: metFORMIN (GLUCOPHAGE-XR) 500 MG 24 hr tablet  Here today for a CPE Labs pending as above Did refills for him today BP controlled  This is a really nice and pleasant patient and I certainly hope that his wife is going to be ok  Will plan further follow- up pending labs.   Signed Lamar Blinks, MD  Doristine Devoid to see you today- take care and I will be in touch with your labs asap I am sorry to hear about your wife's cancer and will be hoping that she does well You might also consider getting the shingrix shingles vaccine [facility] at your convenience.   You do likely have a bit of peripheral nerve damage in your feet.  If you would like to try a medication for this at some point we can do so, just let me know   Received his labs, message to pt Blood count is normal No protein in your urine which is good PSA is fine, lower than last year which is great Lab Results      Component                Value               Date                      PSA                      0.67                04/07/2018                PSA                      0.77                04/02/2017                PSA                      0.79                03/20/2016            2 suggestions-  I would like to put you on a touch of a cholesterol medication in hopes of raising your HDL.  This may reduce your risk of heart disease later on Also, your A1c has gone up, not a whole lot but enough  that I would like to increase your metformin to 1000 mg daily   Please let me know if these ideas are ok with you, and we can plan to visit in 6 months. Results for orders placed or performed in visit on 04/07/18  CBC  Result Value Ref Range   WBC 6.7 4.0 - 10.5 K/uL   RBC 4.78 4.22 - 5.81 Mil/uL   Platelets 331.0 150.0 - 400.0 K/uL   Hemoglobin 15.0 13.0 - 17.0 g/dL   HCT 42.1 39.0 - 52.0 %   MCV 88.0 78.0 - 100.0 fl   MCHC 35.7 30.0 - 36.0 g/dL   RDW 14.0 11.5 - 15.5 %  Comprehensive metabolic panel  Result Value Ref Range   Sodium 136 135 - 145 mEq/L  Potassium 4.4 3.5 - 5.1 mEq/L   Chloride 99 96 - 112 mEq/L   CO2 32 19 - 32 mEq/L   Glucose, Bld 153 (H) 70 - 99 mg/dL   BUN 18 6 - 23 mg/dL   Creatinine, Ser 0.94 0.40 - 1.50 mg/dL   Total Bilirubin 0.5 0.2 - 1.2 mg/dL   Alkaline Phosphatase 58 39 - 117 U/L   AST 19 0 - 37 U/L   ALT 22 0 - 53 U/L   Total Protein 7.2 6.0 - 8.3 g/dL   Albumin 4.0 3.5 - 5.2 g/dL   Calcium 9.6 8.4 - 10.5 mg/dL   GFR 84.47 >60.00 mL/min  Hemoglobin A1c  Result Value Ref Range   Hgb A1c MFr Bld 7.5 (H) 4.6 - 6.5 %  Lipid panel  Result Value Ref Range   Cholesterol 159 0 - 200 mg/dL   Triglycerides 135.0 0.0 - 149.0 mg/dL   HDL 36.60 (L) >39.00 mg/dL   VLDL 27.0 0.0 - 40.0 mg/dL   LDL Cholesterol 95 0 - 99 mg/dL   Total CHOL/HDL Ratio 4    NonHDL 122.10   PSA  Result Value Ref Range   PSA 0.67 0.10 - 4.00 ng/mL  Microalbumin / creatinine urine ratio  Result Value Ref Range   Microalb, Ur 0.5 0.0 - 1.9 mg/dL   Creatinine,U 81.9 mg/dL   Microalb Creat Ratio 0.6 0.0 - 30.0 mg/g   Message to pt

## 2018-04-07 ENCOUNTER — Encounter: Payer: Self-pay | Admitting: Family Medicine

## 2018-04-07 ENCOUNTER — Ambulatory Visit (INDEPENDENT_AMBULATORY_CARE_PROVIDER_SITE_OTHER): Payer: Medicare HMO | Admitting: Family Medicine

## 2018-04-07 VITALS — BP 139/79 | HR 82 | Temp 98.2°F | Ht 74.0 in | Wt 259.2 lb

## 2018-04-07 DIAGNOSIS — E785 Hyperlipidemia, unspecified: Secondary | ICD-10-CM

## 2018-04-07 DIAGNOSIS — Z0000 Encounter for general adult medical examination without abnormal findings: Secondary | ICD-10-CM | POA: Diagnosis not present

## 2018-04-07 DIAGNOSIS — Z130 Encounter for screening for diseases of the blood and blood-forming organs and certain disorders involving the immune mechanism: Secondary | ICD-10-CM | POA: Diagnosis not present

## 2018-04-07 DIAGNOSIS — R739 Hyperglycemia, unspecified: Secondary | ICD-10-CM | POA: Diagnosis not present

## 2018-04-07 DIAGNOSIS — E119 Type 2 diabetes mellitus without complications: Secondary | ICD-10-CM

## 2018-04-07 DIAGNOSIS — Z125 Encounter for screening for malignant neoplasm of prostate: Secondary | ICD-10-CM

## 2018-04-07 DIAGNOSIS — I10 Essential (primary) hypertension: Secondary | ICD-10-CM | POA: Diagnosis not present

## 2018-04-07 LAB — CBC
HCT: 42.1 % (ref 39.0–52.0)
Hemoglobin: 15 g/dL (ref 13.0–17.0)
MCHC: 35.7 g/dL (ref 30.0–36.0)
MCV: 88 fl (ref 78.0–100.0)
Platelets: 331 10*3/uL (ref 150.0–400.0)
RBC: 4.78 Mil/uL (ref 4.22–5.81)
RDW: 14 % (ref 11.5–15.5)
WBC: 6.7 10*3/uL (ref 4.0–10.5)

## 2018-04-07 LAB — COMPREHENSIVE METABOLIC PANEL
ALBUMIN: 4 g/dL (ref 3.5–5.2)
ALK PHOS: 58 U/L (ref 39–117)
ALT: 22 U/L (ref 0–53)
AST: 19 U/L (ref 0–37)
BUN: 18 mg/dL (ref 6–23)
CO2: 32 mEq/L (ref 19–32)
CREATININE: 0.94 mg/dL (ref 0.40–1.50)
Calcium: 9.6 mg/dL (ref 8.4–10.5)
Chloride: 99 mEq/L (ref 96–112)
GFR: 84.47 mL/min (ref >60.00)
GLUCOSE: 153 mg/dL — AB (ref 70–99)
POTASSIUM: 4.4 meq/L (ref 3.5–5.1)
Sodium: 136 mEq/L (ref 135–145)
TOTAL PROTEIN: 7.2 g/dL (ref 6.0–8.3)
Total Bilirubin: 0.5 mg/dL (ref 0.2–1.2)

## 2018-04-07 LAB — LIPID PANEL
CHOL/HDL RATIO: 4
Cholesterol: 159 mg/dL (ref 0–200)
HDL: 36.6 mg/dL — AB (ref >39.00)
LDL Cholesterol: 95 mg/dL (ref 0–99)
NonHDL: 122.1
Triglycerides: 135 mg/dL (ref 0.0–149.0)
VLDL: 27 mg/dL (ref 0.0–40.0)

## 2018-04-07 LAB — MICROALBUMIN / CREATININE URINE RATIO
Creatinine,U: 81.9 mg/dL
MICROALB UR: 0.5 mg/dL (ref 0.0–1.9)
Microalb Creat Ratio: 0.6 mg/g (ref 0.0–30.0)

## 2018-04-07 LAB — PSA: PSA: 0.67 ng/mL (ref 0.10–4.00)

## 2018-04-07 LAB — HEMOGLOBIN A1C: HEMOGLOBIN A1C: 7.5 % — AB (ref 4.6–6.5)

## 2018-04-07 MED ORDER — METFORMIN HCL ER 500 MG PO TB24: 500.0000 mg | ORAL_TABLET | Freq: Every day | ORAL | 3 refills | Status: DC

## 2018-04-07 MED ORDER — CHLORTHALIDONE 25 MG PO TABS: 25.0000 mg | ORAL_TABLET | Freq: Every day | ORAL | 3 refills | Status: DC

## 2018-04-07 NOTE — Patient Instructions (Addendum)
Great to see you today- take care and I will be in touch with your labs asap I am sorry to hear about your wife's cancer and will be hoping that she does well You might also consider getting the [facility]rix [facility]ingles vaccine [facility] at [facility].   You do likely have a bit of peripheral nerve damage in your feet.  If you would like to try a medication for this at some point we can do so, just let me know    Health Maintenance, Male A healthy lifestyle and preventive care is important for your health and wellness. Ask your health care provider about what schedule of regular examinations is right for you. What should I know about weight and diet? Eat a Healthy Diet  Eat plenty of vegetables, fruits, whole grains, low-fat dairy products, and lean protein.  Do not eat a lot of foods high in solid fats, added sugars, or salt.  Maintain a Healthy Weight Regular exercise can help you achieve or maintain a healthy weight. You should:  Do at least 150 minutes of exercise each week. The exercise should increase your heart rate and make you sweat (moderate-intensity exercise).  Do strength-training exercises at least twice a week.  Watch Your Levels of Cholesterol and Blood Lipids  Have your blood tested for lipids and cholesterol every 5 years starting at 70 years of age. If you are at high risk for heart disease, you should start having your blood tested when you are 70 years old. You may need to have your cholesterol levels checked more often if: ? Your lipid or cholesterol levels are high. ? You are older than 70 years of age. ? You are at high risk for heart disease.  What should I know about cancer screening? Many types of cancers can be detected early and may often be prevented. Lung Cancer  You should be screened every year for lung cancer if: ? You are a current smoker who has smoked for at least 30 years. ? You are a former smoker who has quit within the past 15  years.  Talk to your health care provider about your screening options, when you should start screening, and how often you should be screened.  Colorectal Cancer  Routine colorectal cancer screening usually begins at 70 years of age and should be repeated every 5-10 years until you are 70 years old. You may need to be screened more often if early forms of precancerous polyps or small growths are found. Your health care provider may recommend screening at an earlier age if you have risk factors for colon cancer.  Your health care provider may recommend using home test kits to check for hidden blood in the stool.  A small camera at the end of a tube can be used to examine your colon (sigmoidoscopy or colonoscopy). This checks for the earliest forms of colorectal cancer.  Prostate and Testicular Cancer  Depending on your age and overall health, your health care provider may do certain tests to screen for prostate and testicular cancer.  Talk to your health care provider about any symptoms or concerns you have about testicular or prostate cancer.  Skin Cancer  Check your skin from head to toe regularly.  Tell your health care provider about any new moles or changes in moles, especially if: ? There is a change in a mole's size, shape, or color. ? You have a mole that is larger than a pencil eraser.  Always  use sunscreen. Apply sunscreen liberally and repeat throughout the day.  Protect yourself by wearing long sleeves, pants, a wide-brimmed hat, and sunglasses when outside.  What should I know about heart disease, diabetes, and high blood pressure?  If you are 20-61 years of age, have your blood pressure checked every 3-5 years. If you are 65 years of age or older, have your blood pressure checked every year. You should have your blood pressure measured twice-once when you are at a hospital or clinic, and once when you are not at a hospital or clinic. Record the average of the two  measurements. To check your blood pressure when you are not at a hospital or clinic, you can use: ? An automated blood pressure machine at a pharmacy. ? A home blood pressure monitor.  Talk to your health care provider about your target blood pressure.  If you are between 28-84 years old, ask your health care provider if you should take aspirin to prevent heart disease.  Have regular diabetes screenings by checking your fasting blood sugar level. ? If you are at a normal weight and have a low risk for diabetes, have this test once every three years after the age of 70. ? If you are overweight and have a high risk for diabetes, consider being tested at a younger age or more often.  A one-time screening for abdominal aortic aneurysm (AAA) by ultrasound is recommended for men aged 30-75 years who are current or former smokers. What should I know about preventing infection? Hepatitis B If you have a higher risk for hepatitis B, you should be screened for this virus. Talk with your health care provider to find out if you are at risk for hepatitis B infection. Hepatitis C Blood testing is recommended for:  Everyone born from [birth year] through 1963.  Anyone with known risk factors for hepatitis C.  Sexually Transmitted Diseases (STDs)  You should be screened each year for STDs including gonorrhea and chlamydia if: ? You are sexually active and are younger than 70 years of age. ? You are older than 70 years of age and your health care provider tells you that you are at risk for this type of infection. ? Your sexual activity has changed since you were last screened and you are at an increased risk for chlamydia or gonorrhea. Ask your health care provider if you are at risk.  Talk with your health care provider about whether you are at high risk of being infected with HIV. Your health care provider may recommend a prescription medicine to help prevent HIV infection.  What else can I do?  Schedule  regular health, dental, and eye exams.  Stay current with your vaccines (immunizations).  Do not use any tobacco products, such as cigarettes, chewing tobacco, and e-cigarettes. If you need help quitting, ask your health care provider.  Limit alcohol intake to no more than 2 drinks per day. One drink equals 12 ounces of beer, 5 ounces of wine, or 1 ounces of hard liquor.  Do not use street drugs.  Do not share needles.  Ask your health care provider for help if you need support or information about quitting drugs.  Tell your health care provider if you often feel depressed.  Tell your health care provider if you have ever been abused or do not feel safe at home. This information is not intended to replace advice given to you by your health care provider. Make sure you discuss any questions  you have with your health care provider. Document Released: 05/29/2008 Document Revised: 07/30/2016 Document Reviewed: 09/04/2015 Elsevier Interactive Patient Education  [employer].

## 2018-04-08 MED ORDER — METFORMIN HCL ER 500 MG PO TB24: 1000.0000 mg | ORAL_TABLET | Freq: Every day | ORAL | 3 refills | Status: DC

## 2018-04-08 MED ORDER — PRAVASTATIN SODIUM 20 MG PO TABS: 20.0000 mg | ORAL_TABLET | Freq: Every day | ORAL | 3 refills | Status: DC

## 2018-04-19 DIAGNOSIS — G4733 Obstructive sleep apnea (adult) (pediatric): Secondary | ICD-10-CM | POA: Diagnosis not present

## 2018-05-20 DIAGNOSIS — G4733 Obstructive sleep apnea (adult) (pediatric): Secondary | ICD-10-CM | POA: Diagnosis not present

## 2018-06-19 DIAGNOSIS — G4733 Obstructive sleep apnea (adult) (pediatric): Secondary | ICD-10-CM | POA: Diagnosis not present

## 2018-07-05 ENCOUNTER — Ambulatory Visit: Payer: Medicare HMO | Admitting: Adult Health

## 2018-07-20 DIAGNOSIS — G4733 Obstructive sleep apnea (adult) (pediatric): Secondary | ICD-10-CM | POA: Diagnosis not present

## 2018-09-28 DIAGNOSIS — L570 Actinic keratosis: Secondary | ICD-10-CM | POA: Diagnosis not present

## 2018-09-28 DIAGNOSIS — Z85828 Personal history of other malignant neoplasm of skin: Secondary | ICD-10-CM | POA: Diagnosis not present

## 2018-10-05 NOTE — Progress Notes (Addendum)
[name] [place], [place], [place] [address] [phone]  Date:  10/06/2018   Name:  [name]   DOB:  1946/08/23   MRN:  [id number]  PCP:  Darreld Mclean, MD    Chief Complaint: Ankle Pain (bilateral ankle pain, redness, burning, 1 week, no known injury) and Abdominal Pain (lower abdominal, 2-3 weeks, constipation, no blood in stool no N/v )   History of Present Illness:  [name] is a 72 y.o. very pleasant male patient who presents with the following:  History of DM, HTN, A fib Last seen here in April: He is still working out in the gym He is still working, but he has cut back some. 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  He has not noted any sx of atrial flutter  Sadly his wife [name] was recently dx with lymphoma and is getting treatment at [facility] .  They are hopeful that she will do well Besides this stress all is ok in his life right now He does note that he will sometimes have some pain and tingling in his bilateral feet which he thinks is due to diabetes.  No other neurological sx.  His neuropathy sx are not severe enough for him to want to start on a medication at this time however   Lab Results  Component Value Date   HGBA1C 7.5 (H) 04/07/2018  foot exam due: will obtaintoday Eye exam: due, reminded him today  Flu shot- give today  Metformin 1000 per day  Here today with his wife who is doing well with her treatment.  She is being treated and [facility] and is responding well   He has noted a lower abd tenderness for a year or so- does not always hurt  He has always had 1 stool per day - however over the last month he has noted constipation.  He may have a small stool every couple of days.  This is not typical for him colonoscopy done in 2013, and was all clear  Sometimes stools are loose No blood in his stool No  vomiting His appetite is still good  About a week ago he notes that both ankles were a bit swollen, stinging and appeared red He will get a mark from his socks from mild swelling  No recent med changes  No activity changes noted   He has numbness in both feet, has been present for about a year now  We think this is due to his DM  Wt Readings from Last 3 Encounters:  10/06/18 261 lb (118.4 kg)  04/07/18 259 lb 3.2 oz (117.6 kg)  02/13/18 260 lb 3.2 oz (118 kg)   chlorthalidone 25 cardizem Metformin  pravachol    Patient Active Problem List   Diagnosis Date Noted  . Controlled type 2 diabetes mellitus without complication, without long-term current use of insulin ([place]) 04/02/2017  . Left hip pain 02/25/2016  . Hyperglycemia 12/07/2015  . CMV infection, acute ([place]) 09/11/2015  . Low HDL (under 40) 07/12/2013  . Angioedema 11/08/2012  . Paroxysmal atrial flutter ([place]) 02/04/2012  . Essential hypertension 06/15/2009  . BRONCHOSPASM 06/14/2009    Past Medical History:  Diagnosis Date  . Allergic angioedema   . Allergy   . Atrial flutter ([place])   . Hypertension   . Pneumonia   .  RAD (reactive airway disease)     Past Surgical History:  Procedure Laterality Date  . APPENDECTOMY    . CHOLECYSTECTOMY    . COLONOSCOPY    . stress myoview  12/13/2008   EF 61% LV  normal  . TONSILLECTOMY      Social History   Tobacco Use  . Smoking status: Former Smoker    Types: Cigarettes, Pipe    Last attempt to quit: 10/05/1996    Years since quitting: 22.0  . Smokeless tobacco: Former Systems developer    Types: Chew  . Tobacco comment: Occasionally  Substance Use Topics  . Alcohol use: No    Alcohol/week: 0.0 standard drinks  . Drug use: No    Family History  Problem Relation Age of Onset  . Pancreatic cancer Mother   . Hypertension Mother   . Cancer Mother   . Hypertension Sister   . Asthma Son   . Stroke Father   . Hypertension Father   . Hypertension Brother   .  Hypertension Daughter   . Cancer Maternal Grandfather     Allergies  Allergen Reactions  . Ibuprofen Swelling  . Lisinopril Swelling    Swelling of face and lips   . Oxaprozin Swelling    Swelling of face and lips  . Ziac [Bisoprolol-Hydrochlorothiazide] Swelling    Per patient records from Dr. Florina Ou.  Thayer Jew Hcl] Itching  . Hctz [Hydrochlorothiazide]   . Norvasc [Amlodipine Besylate]     Medication list has been reviewed and updated.  Current Outpatient Medications on File Prior to Visit  Medication Sig Dispense Refill  . cetirizine (ZYRTEC) 10 MG tablet Take 10 mg by mouth daily.    . chlorthalidone (HYGROTON) 25 MG tablet Take 1 tablet (25 mg total) by mouth daily. 90 tablet 3  . diltiazem (CARDIZEM CD) 300 MG 24 hr capsule Take 1 capsule (300 mg total) by mouth daily. Needs appointment for refills. 30 capsule 1  . diltiazem (CARDIZEM CD) 300 MG 24 hr capsule TAKE ONE CAPSULE BY MOUTH EVERY DAY 90 capsule 3  . EPINEPHrine (EPI-PEN) 0.3 mg/0.3 mL DEVI Inject 0.3 mLs (0.3 mg total) into the muscle once. 1 Device 1  . metFORMIN (GLUCOPHAGE-XR) 500 MG 24 hr tablet Take 2 tablets (1,000 mg total) by mouth daily with breakfast. 180 tablet 3  . pravastatin (PRAVACHOL) 20 MG tablet Take 1 tablet (20 mg total) by mouth daily. 90 tablet 3   No current facility-administered medications on file prior to visit.     Review of Systems:  As per HPI- otherwise negative.   Physical Examination: Vitals:   10/06/18 1322  BP: (!) 158/78  Pulse: 90  Resp: 16  Temp: 98.3 F (36.8 C)  SpO2: 95%   Vitals:   10/06/18 1322  Weight: 261 lb (118.4 kg)  Height: 6\' 2"  (1.88 m)   Body mass index is 33.51 kg/m. Ideal Body Weight: Weight in (lb) to have BMI = 25: 194.3  GEN: WDWN, NAD, Non-toxic, A & O x 3, looks well, large/ tall build  HEENT: Atraumatic, Normocephalic. Neck supple. No masses, No LAD. Ears and Nose: No external deformity. CV: RRR, No M/G/R. No JVD.  No thrill. No extra heart sounds. PULM: CTA B, no wheezes, crackles, rhonchi. No retractions. No resp. distress. No accessory muscle use. ABD: S, ND, +BS. No rebound. No HSM.  He has really diffuse mild tenderness over his abdomen that is not consistnet on serial exams.  Not an acute  abdomen  EXTR: No c/c/e NEURO Normal gait.  PSYCH: Normally interactive. Conversant. Not depressed or anxious appearing.  Calm demeanor.  Foot exam: feet are cool but not cold. Pt notes reduced hair growth Cannot palpate pulses Monofilament sensation is normal  At this time the redness and swelling that he had noted in his ankles seems to be nearly 100% resolved  Assessment and Plan: Essential hypertension - Plan: CBC, Comprehensive metabolic panel  Dyslipidemia  Controlled type 2 diabetes mellitus without complication, without long-term current use of insulin ([place]) - Plan: Hemoglobin A1c  Vasculitis ([place]) - Plan: CBC, Comprehensive metabolic panel, Sedimentation rate, C-reactive protein  Circulation disorder of lower extremity - Plan: VAS [place] ABI WITH/WO TBI  Bowel habit changes - Plan: CBC, Comprehensive metabolic panel, DG Abd Acute W/Chest  Need for influenza vaccination - Plan: Flu vaccine HIGH DOSE PF (Fluzone High dose)  Following up today Check his routine labs, A1c Flu shot given I am concerned about the change in his bowel pattern Obtain plain films- ? Constipation.  Asked him to try colace. However if no obvious cause found he likely needs to see GI again for a scope Numbness in feet, recent redness of ankles.  ?vasculitis.  Suspect he has poor arterial circulation Will obtain ABIs  Sed rate to look for any abnl inflammation  Signed Lamar Blinks, MD  Received his labs  Sed rate and CRP are normal- no sign of abnormal inflammation Metabolic profile is ok- sodium is normal when corrected for high glucose Speaking of high glucose, your A1c- average blood sugar- is now too high.  We  need to get this back in the 7s. Your last check was 7.5%.  Have you changed your eating habits a lot since [name] has been sick?  I know you have been under a lot of stress!  I would suggest that we increase your metformin to twice day until we get your A1c back down I will send in an rx for this increased dose of metformin Blood counts are normal Again, after I get your x-ray reports back will think about the next step.  I would suspect we will send you back to your GI doctor for an early re-scope Please keep me closely apprised as to how you are doing.  We will want to repeat an A1c in 3-4 months  Results for orders placed or performed in visit on 10/06/18  Hemoglobin A1c  Result Value Ref Range   Hgb A1c MFr Bld 9.0 (H) 4.6 - 6.5 %  CBC  Result Value Ref Range   WBC 6.6 4.0 - 10.5 K/uL   RBC 4.97 4.22 - 5.81 Mil/uL   Platelets 288.0 150.0 - 400.0 K/uL   Hemoglobin 15.4 13.0 - 17.0 g/dL   HCT 43.1 39.0 - 52.0 %   MCV 86.7 78.0 - 100.0 fl   MCHC 35.8 30.0 - 36.0 g/dL   RDW 13.3 11.5 - 15.5 %  Comprehensive metabolic panel  Result Value Ref Range   Sodium 134 (L) 135 - 145 mEq/L   Potassium 3.4 (L) 3.5 - 5.1 mEq/L   Chloride 97 96 - 112 mEq/L   CO2 27 19 - 32 mEq/L   Glucose, Bld 292 (H) 70 - 99 mg/dL   BUN 29 (H) 6 - 23 mg/dL   Creatinine, Ser 1.23 0.40 - 1.50 mg/dL   Total Bilirubin 0.5 0.2 - 1.2 mg/dL   Alkaline Phosphatase 69 39 - 117 U/L   AST 24 0 -  37 U/L   ALT 37 0 - 53 U/L   Total Protein 7.5 6.0 - 8.3 g/dL   Albumin 4.2 3.5 - 5.2 g/dL   Calcium 9.2 8.4 - 10.5 mg/dL   GFR 61.85 >60.00 mL/min  Sedimentation rate  Result Value Ref Range   Sed Rate 20 0 - 20 mm/hr  C-reactive protein  Result Value Ref Range   CRP 1.6 0.5 - 20.0 mg/dL   Also received his x-rays on 10/24  Dg Abd Acute W/chest  Result Date: 10/07/2018 CLINICAL DATA:  Pelvic pain for 1 month EXAM: DG ABDOMEN ACUTE W/ 1V CHEST COMPARISON:  11/13/2015 FINDINGS: Cardiac shadow is within normal limits.  Aortic calcifications are noted. The lungs are well aerated bilaterally. Scattered large and small bowel gas is noted. Mild retained fecal material is seen without obstructive change. No abnormal mass or abnormal calcifications are noted. Degenerative changes of lumbar spine and hip joints are noted. No free air is seen. IMPRESSION: No acute abnormality noted. Electronically Signed   By: Inez Catalina M.D.   On: 10/07/2018 08:19

## 2018-10-06 ENCOUNTER — Ambulatory Visit (HOSPITAL_BASED_OUTPATIENT_CLINIC_OR_DEPARTMENT_OTHER)
Admission: RE | Admit: 2018-10-06 | Discharge: 2018-10-06 | Disposition: A | Discharge status: Home / Self Care | Payer: Medicare HMO | Source: Ambulatory Visit | Attending: Family Medicine | Admitting: Family Medicine

## 2018-10-06 ENCOUNTER — Encounter: Payer: Self-pay | Admitting: Family Medicine

## 2018-10-06 ENCOUNTER — Ambulatory Visit (INDEPENDENT_AMBULATORY_CARE_PROVIDER_SITE_OTHER): Payer: Medicare HMO | Admitting: Family Medicine

## 2018-10-06 VITALS — BP 124/78 | HR 90 | Temp 98.3°F | Resp 16 | Ht 74.0 in | Wt 261.0 lb

## 2018-10-06 DIAGNOSIS — E785 Hyperlipidemia, unspecified: Secondary | ICD-10-CM

## 2018-10-06 DIAGNOSIS — R194 Change in bowel habit: Secondary | ICD-10-CM

## 2018-10-06 DIAGNOSIS — I776 Arteritis, unspecified: Secondary | ICD-10-CM

## 2018-10-06 DIAGNOSIS — I999 Unspecified disorder of circulatory system: Secondary | ICD-10-CM | POA: Diagnosis not present

## 2018-10-06 DIAGNOSIS — R102 Pelvic and perineal pain: Secondary | ICD-10-CM | POA: Diagnosis not present

## 2018-10-06 DIAGNOSIS — Z23 Encounter for immunization: Secondary | ICD-10-CM | POA: Diagnosis not present

## 2018-10-06 DIAGNOSIS — R1030 Lower abdominal pain, unspecified: Secondary | ICD-10-CM | POA: Insufficient documentation

## 2018-10-06 DIAGNOSIS — I10 Essential (primary) hypertension: Secondary | ICD-10-CM | POA: Diagnosis not present

## 2018-10-06 DIAGNOSIS — E119 Type 2 diabetes mellitus without complications: Secondary | ICD-10-CM | POA: Diagnosis not present

## 2018-10-06 DIAGNOSIS — R739 Hyperglycemia, unspecified: Secondary | ICD-10-CM | POA: Diagnosis not present

## 2018-10-06 LAB — COMPREHENSIVE METABOLIC PANEL
ALK PHOS: 69 U/L (ref 39–117)
ALT: 37 U/L (ref 0–53)
AST: 24 U/L (ref 0–37)
Albumin: 4.2 g/dL (ref 3.5–5.2)
BUN: 29 mg/dL — ABNORMAL HIGH (ref 6–23)
CALCIUM: 9.2 mg/dL (ref 8.4–10.5)
CO2: 27 mEq/L (ref 19–32)
Chloride: 97 mEq/L (ref 96–112)
Creatinine, Ser: 1.23 mg/dL (ref 0.40–1.50)
GFR: 61.85 mL/min (ref >60.00)
Glucose, Bld: 292 mg/dL — ABNORMAL HIGH (ref 70–99)
POTASSIUM: 3.4 meq/L — AB (ref 3.5–5.1)
Sodium: 134 mEq/L — ABNORMAL LOW (ref 135–145)
TOTAL PROTEIN: 7.5 g/dL (ref 6.0–8.3)
Total Bilirubin: 0.5 mg/dL (ref 0.2–1.2)

## 2018-10-06 LAB — CBC
HEMATOCRIT: 43.1 % (ref 39.0–52.0)
HEMOGLOBIN: 15.4 g/dL (ref 13.0–17.0)
MCHC: 35.8 g/dL (ref 30.0–36.0)
MCV: 86.7 fl (ref 78.0–100.0)
Platelets: 288 10*3/uL (ref 150.0–400.0)
RBC: 4.97 Mil/uL (ref 4.22–5.81)
RDW: 13.3 % (ref 11.5–15.5)
WBC: 6.6 10*3/uL (ref 4.0–10.5)

## 2018-10-06 LAB — HEMOGLOBIN A1C: Hgb A1c MFr Bld: 9 % — ABNORMAL HIGH (ref 4.6–6.5)

## 2018-10-06 LAB — SEDIMENTATION RATE: SED RATE: 20 mm/h (ref 0–20)

## 2018-10-06 LAB — C-REACTIVE PROTEIN: CRP: 1.6 mg/dL (ref 0.5–20.0)

## 2018-10-06 MED ORDER — METFORMIN HCL ER 500 MG PO TB24: 1000.0000 mg | ORAL_TABLET | Freq: Two times a day (BID) | ORAL | 3 refills | Status: DC

## 2018-10-06 NOTE — Patient Instructions (Signed)
Great to see you today as always Please get an eye exam at your convenience I will be in touch wiht your labs asap After your blood draw please go to the imaging services dept on the ground floor to have x-rays of your belly I will also arrange to test the circulation in your legs at the vascular clinic in [place], [address]

## 2018-10-07 ENCOUNTER — Ambulatory Visit (HOSPITAL_COMMUNITY)
Admission: RE | Admit: 2018-10-07 | Discharge: 2018-10-07 | Disposition: A | Discharge status: Home / Self Care | Payer: Medicare HMO | Source: Ambulatory Visit | Attending: Family Medicine | Admitting: Family Medicine

## 2018-10-07 ENCOUNTER — Encounter: Payer: Self-pay | Admitting: Family Medicine

## 2018-10-07 DIAGNOSIS — I999 Unspecified disorder of circulatory system: Secondary | ICD-10-CM | POA: Insufficient documentation

## 2018-10-08 ENCOUNTER — Encounter: Payer: Self-pay | Admitting: Family Medicine

## 2018-10-26 ENCOUNTER — Ambulatory Visit: Payer: Medicare HMO | Admitting: Gastroenterology

## 2018-10-26 ENCOUNTER — Encounter: Payer: Self-pay | Admitting: Gastroenterology

## 2018-10-26 VITALS — BP 126/60 | HR 88 | Ht 72.5 in | Wt 262.5 lb

## 2018-10-26 DIAGNOSIS — R109 Unspecified abdominal pain: Secondary | ICD-10-CM | POA: Diagnosis not present

## 2018-10-26 DIAGNOSIS — R194 Change in bowel habit: Secondary | ICD-10-CM

## 2018-10-26 NOTE — Progress Notes (Signed)
HPI :  72 year old male here for follow-up visit.  I saw him about 3 years ago for abdominal pain. At that time he endorsed pain located inferior to the umbilicus, around the belt line, mid abdomen, which had started several weeks after being bit by a tick in that area. At that time he reported soreness and tenderness to the touch, and was present all the time. He had a few CT scans around that time which did not show any significant pathology. I had suspected he had musculoskeletal or neuropathic pain at the time.  He reports his pain had been minimal over the past few years although appears to have worsened in recent months. He reports is more noticeable at this time, rated anywhere from 3-4/10. Sometimes he has pain, sometimes he has periods without any pain. It appears to bother him at night more so than other times. He has tried changing diet which hasn't helped much. She does not feel any pain after he eats. He has noted some changes in his bowels. Previously he was having one bowel movement a day, now he has some days without any bowel movements. He does think that having a bowel movement can sometimes make this slightly better but not always. He denies any positional changes to his pain. He denies any family history of colon cancer. His last colonoscopy was about 6 years ago and was normal.  He had an abdominal x-ray in October which was normal. Last CT scan in November 2016 showed hemorrhagic proteinaceous cyst of the left kidney and hepatic steatosis. Recent blood work shows normal liver function testing and normal blood counts.  CT abdomen with contrast 11/13/2015 - left renal lesion - hemorrhagic / proteinaceous cyst, hepatic steatosis, advanced atherosclerosis  Colonoscopy 10/2012 - diverticulosis, otherwise normal.    Past Medical History:  Diagnosis Date  . Allergic angioedema   . Allergy   . Atrial flutter ([place])   . Hypertension   . Pneumonia   . RAD (reactive airway disease)    . Tick bite      Past Surgical History:  Procedure Laterality Date  . APPENDECTOMY    . CHOLECYSTECTOMY    . COLONOSCOPY    . stress myoview  12/13/2008   EF 61% LV  normal  . TONSILLECTOMY     Family History  Problem Relation Age of Onset  . Pancreatic cancer Mother   . Hypertension Mother   . Cancer Mother   . Hypertension Sister   . Asthma Son   . Stroke Father   . Hypertension Father   . Hypertension Brother   . Hypertension Daughter   . Cancer Maternal Grandfather    Social History   Tobacco Use  . Smoking status: Former Smoker    Types: Cigarettes, Pipe    Last attempt to quit: 10/05/1996    Years since quitting: 22.0  . Smokeless tobacco: Former Systems developer    Types: Chew  . Tobacco comment: Occasionally  Substance Use Topics  . Alcohol use: No    Alcohol/week: 0.0 standard drinks  . Drug use: No   Current Outpatient Medications  Medication Sig Dispense Refill  . cetirizine (ZYRTEC) 10 MG tablet Take 10 mg by mouth daily.    . chlorthalidone (HYGROTON) 25 MG tablet Take 1 tablet (25 mg total) by mouth daily. 90 tablet 3  . diltiazem (CARDIZEM CD) 300 MG 24 hr capsule TAKE ONE CAPSULE BY MOUTH EVERY DAY 90 capsule 3  . metFORMIN (GLUCOPHAGE-XR) 500 MG  24 hr tablet Take 2 tablets (1,000 mg total) by mouth 2 (two) times daily. 360 tablet 3  . pravastatin (PRAVACHOL) 20 MG tablet Take 1 tablet (20 mg total) by mouth daily. 90 tablet 3  . EPINEPHrine (EPI-PEN) 0.3 mg/0.3 mL DEVI Inject 0.3 mLs (0.3 mg total) into the muscle once. (Patient not taking: Reported on 10/26/2018) 1 Device 1   No current facility-administered medications for this visit.    Allergies  Allergen Reactions  . Ibuprofen Swelling  . Lisinopril Swelling    Swelling of face and lips   . Oxaprozin Swelling    Swelling of face and lips  . Ziac [Bisoprolol-Hydrochlorothiazide] Swelling    Per patient records from Dr. Florina Ou.  Thayer Jew Hcl] Itching  . Hctz  [Hydrochlorothiazide]   . Norvasc [Amlodipine Besylate]      Review of Systems: All systems reviewed and negative except where noted in HPI.   Lab Results  Component Value Date   WBC 6.6 10/06/2018   HGB 15.4 10/06/2018   HCT 43.1 10/06/2018   MCV 86.7 10/06/2018   PLT 288.0 10/06/2018    Lab Results  Component Value Date   CREATININE 1.23 10/06/2018   BUN 29 (H) 10/06/2018   NA 134 (L) 10/06/2018   K 3.4 (L) 10/06/2018   CL 97 10/06/2018   CO2 27 10/06/2018    Lab Results  Component Value Date   ALT 37 10/06/2018   AST 24 10/06/2018   ALKPHOS 69 10/06/2018   BILITOT 0.5 10/06/2018     Physical Exam: BP 126/60 (BP Location: Left Arm, Patient Position: Sitting, Cuff Size: Normal)   Pulse 88   Ht 6' 0.5" (1.842 m) Comment: height measured without shoes  Wt 262 lb 8 oz (119.1 kg)   BMI 35.11 kg/m  Constitutional: Pleasant,well-developed, male in no acute distress. HEENT: Normocephalic and atraumatic. Conjunctivae are normal. No scleral icterus. Neck supple.  Cardiovascular: Normal rate, regular rhythm.  Pulmonary/chest: Effort normal and breath sounds normal. No wheezing, rales or rhonchi. Abdominal: Soft, nondistended, some lower abdominal to mid abdominal TTP mild, no rebound or guarding.  There are no masses palpable. No hepatomegaly. Extremities: no edema Lymphadenopathy: No cervical adenopathy noted. Neurological: Alert and oriented to person place and time. Skin: Skin is warm and dry. No rashes noted. Psychiatric: Normal mood and affect. Behavior is normal.   ASSESSMENT AND PLAN: 72 year old male here for a follow-up visit:  Abdominal pain / change in bowel habits - this pain is similar in nature to when he presented 3 years ago, has never fully resolved but had been mild, now with recent worsening. He had multiple imaging studies in 2016 for this issue which failed to show any pathology to cause it. It remains possible this is a musculoskeletal or  neuropathic type of pain which we discussed, however given his recent change in bowel habits, I offered him a colonoscopy to ensure everything is okay and ensure no pathology there to cause this problem. We discussed the risks and benefits of colonoscopy and anesthesia, following discussion he wanted to proceed. Further recommendations pending the results. He agreed with the plan.   [place], MD [facility]

## 2018-10-26 NOTE — Patient Instructions (Signed)
You have been scheduled for a colonoscopy. Please follow written instructions given to you at your visit today.  Your physician has requested that you go to www.[email or web address].[email or web address] and enter the access code given to you at your visit today. This web site gives a general overview about your procedure. However, you should still follow specific instructions given to you by our office regarding your preparation for the procedure.

## 2018-10-28 ENCOUNTER — Ambulatory Visit (AMBULATORY_SURGERY_CENTER): Payer: Medicare HMO | Admitting: Gastroenterology

## 2018-10-28 ENCOUNTER — Encounter: Payer: Self-pay | Admitting: Gastroenterology

## 2018-10-28 VITALS — BP 144/65 | HR 77 | Temp 98.2°F | Resp 14 | Ht 72.0 in | Wt 262.0 lb

## 2018-10-28 DIAGNOSIS — R109 Unspecified abdominal pain: Secondary | ICD-10-CM

## 2018-10-28 DIAGNOSIS — D122 Benign neoplasm of ascending colon: Secondary | ICD-10-CM

## 2018-10-28 DIAGNOSIS — D123 Benign neoplasm of transverse colon: Secondary | ICD-10-CM

## 2018-10-28 DIAGNOSIS — D120 Benign neoplasm of cecum: Secondary | ICD-10-CM

## 2018-10-28 DIAGNOSIS — K5730 Diverticulosis of large intestine without perforation or abscess without bleeding: Secondary | ICD-10-CM | POA: Diagnosis not present

## 2018-10-28 DIAGNOSIS — Z1211 Encounter for screening for malignant neoplasm of colon: Secondary | ICD-10-CM | POA: Diagnosis not present

## 2018-10-28 DIAGNOSIS — R194 Change in bowel habit: Secondary | ICD-10-CM

## 2018-10-28 DIAGNOSIS — K649 Unspecified hemorrhoids: Secondary | ICD-10-CM

## 2018-10-28 MED ORDER — SODIUM CHLORIDE 0.9 % IV SOLN: 500.0000 mL | Freq: Once | INTRAVENOUS | Status: DC

## 2018-10-28 NOTE — Patient Instructions (Signed)
YOU HAD AN ENDOSCOPIC PROCEDURE TODAY AT THE [facility]:   Refer to the procedure report that was given to you for any specific questions about what was found during the examination.  If the procedure report does not answer your questions, please call your gastroenterologist to clarify.  If you requested that your care partner not be given the details of your procedure findings, then the procedure report has been included in a sealed envelope for you to review at your convenience later.  YOU SHOULD EXPECT: Some feelings of bloating in the abdomen. Passage of more gas than usual.  Walking can help get rid of the air that was put into your GI tract during the procedure and reduce the bloating. If you had a lower endoscopy (such as a colonoscopy or flexible sigmoidoscopy) you may notice spotting of blood in your stool or on the toilet paper. If you underwent a bowel prep for your procedure, you may not have a normal bowel movement for a few days.  Please Note:  You might notice some irritation and congestion in your nose or some drainage.  This is from the oxygen used during your procedure.  There is no need for concern and it should clear up in a day or so.  SYMPTOMS TO REPORT IMMEDIATELY:   Following lower endoscopy (colonoscopy or flexible sigmoidoscopy):  Excessive amounts of blood in the stool  Significant tenderness or worsening of abdominal pains  Swelling of the abdomen that is new, acute  Fever of 100F or higher    For urgent or emergent issues, a gastroenterologist can be reached at any hour by calling [phone].   DIET:  We do recommend a small meal at first, but then you may proceed to your regular diet.  Drink plenty of fluids but you should avoid alcoholic beverages for 24 hours.  ACTIVITY:  You should plan to take it easy for the rest of today and you should NOT DRIVE or use heavy machinery until tomorrow (because of the sedation medicines used during the test).     FOLLOW UP: Our staff will call the number listed on your records the next business day following your procedure to check on you and address any questions or concerns that you may have regarding the information given to you following your procedure. If we do not reach you, we will leave a message.  However, if you are feeling well and you are not experiencing any problems, there is no need to return our call.  We will assume that you have returned to your regular daily activities without incident.  If any biopsies were taken you will be contacted by phone or by letter within the next 1-3 weeks.  Please call us at [phone] if you have not heard about the biopsies in 3 weeks.    SIGNATURES/CONFIDENTIALITY: You and/or your care partner have signed paperwork which will be entered into your electronic medical record.  These signatures attest to the fact that that the information above on your After Visit Summary has been reviewed and is understood.  Full responsibility of the confidentiality of this discharge information lies with you and/or your care-partner  Polyp and hemorrhoid information given.. 

## 2018-10-28 NOTE — Op Note (Signed)
[place] Patient Name: [name] Procedure Date: 10/28/2018 3:59 PM MRN: [id number] Endoscopist: Remo Lipps P. Havery Moros , MD Age: 72 Referring MD:  Date of Birth: [birth year] Gender: Male Account #: [id number] Procedure:                Colonoscopy Indications:              Lower abdominal pain, Change in bowel habits -                            negative CT scan in 2016 when evaluated for this                            pain Medicines:                Monitored Anesthesia Care Procedure:                Pre-Anesthesia Assessment:                           - Prior to the procedure, a History and Physical                            was performed, and patient medications and                            allergies were reviewed. The patient's tolerance of                            previous anesthesia was also reviewed. The risks                            and benefits of the procedure and the sedation                            options and risks were discussed with the patient.                            All questions were answered, and informed consent                            was obtained. Prior Anticoagulants: The patient has                            taken no previous anticoagulant or antiplatelet                            agents. ASA Grade Assessment: II - A patient with                            mild systemic disease. After reviewing the risks                            and benefits, the patient was deemed in  satisfactory condition to undergo the procedure.                           After obtaining informed consent, the colonoscope                            was passed under direct vision. Throughout the                            procedure, the patient's blood pressure, pulse, and                            oxygen saturations were monitored continuously. The                            Model CF-HQ190L [id number]) scope was introduced                             through the anus and advanced to the the terminal                            ileum, with identification of the appendiceal                            orifice and IC valve. The colonoscopy was performed                            without difficulty. The patient tolerated the                            procedure well. The quality of the bowel                            preparation was good. The terminal ileum, ileocecal                            valve, appendiceal orifice, and rectum were                            photographed. Scope In: 4:04:27 PM Scope Out: 4:25:49 PM Scope Withdrawal Time: 0 hours 18 minutes 12 seconds  Total Procedure Duration: 0 hours 21 minutes 22 seconds  Findings:                 The perianal and digital rectal examinations were                            normal.                           The terminal ileum appeared normal.                           A diminutive polyp was found in the cecum. The  polyp was sessile. The polyp was removed with a                            cold biopsy forceps. Resection and retrieval were                            complete.                           A 4 mm polyp was found in the ascending colon. The                            polyp was sessile. The polyp was removed with a                            cold snare. Resection and retrieval were complete.                           Two sessile polyps were found in the transverse                            colon. The polyps were 4 to 5 mm in size. These                            polyps were removed with a cold snare. Resection                            and retrieval were complete.                           Many medium-mouthed diverticula were found in the                            left colon.                           Internal hemorrhoids were found during                            retroflexion. The hemorrhoids were moderate.                            The exam was otherwise without abnormality. Complications:            No immediate complications. Estimated blood loss:                            Minimal. Estimated Blood Loss:     Estimated blood loss was minimal. Impression:               - The examined portion of the ileum was normal.                           - One diminutive polyp in the cecum, removed with a  cold biopsy forceps. Resected and retrieved.                           - One 4 mm polyp in the ascending colon, removed                            with a cold snare. Resected and retrieved.                           - Two 4 to 5 mm polyps in the transverse colon,                            removed with a cold snare. Resected and retrieved.                           - Diverticulosis in the left colon.                           - Internal hemorrhoids.                           - The examination was otherwise normal.                           No cause for abdominal pain noted on this exam. Recommendation:           - Patient has a contact number available for                            emergencies. The signs and symptoms of potential                            delayed complications were discussed with the                            patient. Return to normal activities tomorrow.                            Written discharge instructions were provided to the                            patient.                           - Resume previous diet.                           - Continue present medications.                           - Await pathology results.                           - Consideration for repeat imaging if pain is  worsening / persisting Carlota Raspberry. Sydnie Sigmund, MD 10/28/2018 4:31:27 PM This report has been signed electronically.

## 2018-10-28 NOTE — Progress Notes (Signed)
Called to room to assist during endoscopic procedure.  Patient ID and intended procedure confirmed with present staff. Received instructions for my participation in the procedure from the performing physician.  

## 2018-10-28 NOTE — Progress Notes (Signed)
To PACU, VSS. Report to Rn.tb 

## 2018-10-29 ENCOUNTER — Telehealth: Payer: Self-pay | Admitting: *Deleted

## 2018-10-29 ENCOUNTER — Telehealth: Payer: Self-pay

## 2018-10-29 NOTE — Telephone Encounter (Signed)
Pt is returning your call and said he is doing good

## 2018-10-29 NOTE — Telephone Encounter (Signed)
Second follow up call attempt.  No answer. 

## 2018-10-29 NOTE — Telephone Encounter (Signed)
  Follow up Call-  Call back number 10/28/2018  Post procedure Call Back phone  # [phone]  Permission to leave phone message Yes  Some recent data might be hidden     Multiple rings, then a busy signal

## 2018-12-20 DIAGNOSIS — D2262 Melanocytic nevi of left upper limb, including shoulder: Secondary | ICD-10-CM | POA: Diagnosis not present

## 2018-12-20 DIAGNOSIS — D225 Melanocytic nevi of trunk: Secondary | ICD-10-CM | POA: Diagnosis not present

## 2018-12-20 DIAGNOSIS — L821 Other seborrheic keratosis: Secondary | ICD-10-CM | POA: Diagnosis not present

## 2018-12-20 DIAGNOSIS — Z85828 Personal history of other malignant neoplasm of skin: Secondary | ICD-10-CM | POA: Diagnosis not present

## 2018-12-20 DIAGNOSIS — D1801 Hemangioma of skin and subcutaneous tissue: Secondary | ICD-10-CM | POA: Diagnosis not present

## 2018-12-20 DIAGNOSIS — L570 Actinic keratosis: Secondary | ICD-10-CM | POA: Diagnosis not present

## 2018-12-20 DIAGNOSIS — L723 Sebaceous cyst: Secondary | ICD-10-CM | POA: Diagnosis not present

## 2018-12-20 DIAGNOSIS — L720 Epidermal cyst: Secondary | ICD-10-CM | POA: Diagnosis not present

## 2019-02-15 ENCOUNTER — Ambulatory Visit (INDEPENDENT_AMBULATORY_CARE_PROVIDER_SITE_OTHER): Payer: Medicare HMO | Admitting: Medical

## 2019-02-15 ENCOUNTER — Encounter: Payer: Self-pay | Admitting: Medical

## 2019-02-15 ENCOUNTER — Encounter (HOSPITAL_BASED_OUTPATIENT_CLINIC_OR_DEPARTMENT_OTHER): Payer: Self-pay

## 2019-02-15 ENCOUNTER — Ambulatory Visit (HOSPITAL_BASED_OUTPATIENT_CLINIC_OR_DEPARTMENT_OTHER)
Admission: RE | Admit: 2019-02-15 | Discharge: 2019-02-15 | Disposition: A | Discharge status: Home / Self Care | Payer: Medicare HMO | Source: Ambulatory Visit | Attending: Medical | Admitting: Medical

## 2019-02-15 VITALS — BP 134/73 | HR 87 | Temp 98.4°F | Resp 16 | Ht 72.0 in | Wt 258.0 lb

## 2019-02-15 DIAGNOSIS — R05 Cough: Secondary | ICD-10-CM

## 2019-02-15 DIAGNOSIS — R682 Dry mouth, unspecified: Secondary | ICD-10-CM | POA: Diagnosis not present

## 2019-02-15 DIAGNOSIS — E119 Type 2 diabetes mellitus without complications: Secondary | ICD-10-CM

## 2019-02-15 DIAGNOSIS — M549 Dorsalgia, unspecified: Secondary | ICD-10-CM | POA: Diagnosis not present

## 2019-02-15 DIAGNOSIS — R059 Cough, unspecified: Secondary | ICD-10-CM

## 2019-02-15 DIAGNOSIS — I10 Essential (primary) hypertension: Secondary | ICD-10-CM | POA: Diagnosis not present

## 2019-02-15 DIAGNOSIS — R739 Hyperglycemia, unspecified: Secondary | ICD-10-CM

## 2019-02-15 LAB — COMPREHENSIVE METABOLIC PANEL
ALT: 34 U/L (ref 0–53)
AST: 28 U/L (ref 0–37)
Albumin: 4.2 g/dL (ref 3.5–5.2)
Alkaline Phosphatase: 90 U/L (ref 39–117)
BILIRUBIN TOTAL: 0.5 mg/dL (ref 0.2–1.2)
BUN: 22 mg/dL (ref 6–23)
CHLORIDE: 92 meq/L — AB (ref 96–112)
CO2: 30 meq/L (ref 19–32)
CREATININE: 1.13 mg/dL (ref 0.40–1.50)
Calcium: 9.5 mg/dL (ref 8.4–10.5)
GFR: 64.1 mL/min (ref >60.00)
GLUCOSE: 390 mg/dL — AB (ref 70–99)
Potassium: 4.4 mEq/L (ref 3.5–5.1)
SODIUM: 130 meq/L — AB (ref 135–145)
Total Protein: 7.7 g/dL (ref 6.0–8.3)

## 2019-02-15 LAB — POC URINALSYSI DIPSTICK (AUTOMATED)
BILIRUBIN UA: NEGATIVE
GLUCOSE UA: POSITIVE — AB
Ketones, UA: NEGATIVE
Leukocytes, UA: NEGATIVE
NITRITE UA: NEGATIVE
PH UA: 6 (ref 5.0–8.0)
Protein, UA: NEGATIVE
RBC UA: NEGATIVE
SPEC GRAV UA: 1.01 (ref 1.010–1.025)
UROBILINOGEN UA: NEGATIVE U/dL — AB

## 2019-02-15 LAB — CBC WITH DIFFERENTIAL/PLATELET
BASOS ABS: 0 10*3/uL (ref 0.0–0.1)
BASOS PCT: 0.5 % (ref 0.0–3.0)
EOS ABS: 0.1 10*3/uL (ref 0.0–0.7)
Eosinophils Relative: 1.4 % (ref 0.0–5.0)
HCT: 45.9 % (ref 39.0–52.0)
Hemoglobin: 16.2 g/dL (ref 13.0–17.0)
LYMPHS PCT: 30 % (ref 12.0–46.0)
Lymphs Abs: 2.4 10*3/uL (ref 0.7–4.0)
MCHC: 35.2 g/dL (ref 30.0–36.0)
MCV: 88.3 fl (ref 78.0–100.0)
MONO ABS: 0.7 10*3/uL (ref 0.1–1.0)
Monocytes Relative: 8.9 % (ref 3.0–12.0)
NEUTROS ABS: 4.8 10*3/uL (ref 1.4–7.7)
NEUTROS PCT: 59.2 % (ref 43.0–77.0)
PLATELETS: 291 10*3/uL (ref 150.0–400.0)
RBC: 5.2 Mil/uL (ref 4.22–5.81)
RDW: 13.5 % (ref 11.5–15.5)
WBC: 8.2 10*3/uL (ref 4.0–10.5)

## 2019-02-15 LAB — HEMOGLOBIN A1C: Hgb A1c MFr Bld: 12.3 % — ABNORMAL HIGH (ref 4.6–6.5)

## 2019-02-15 MED ORDER — ALPRAZOLAM 0.25 MG PO TABS: 0.2500 mg | ORAL_TABLET | Freq: Two times a day (BID) | ORAL | 0 refills | Status: DC | PRN

## 2019-02-15 NOTE — Progress Notes (Addendum)
Subjective:    Patient ID: [name], male    DOB: 05/06/1946, 73 y.o.   MRN: [id number]  HPI  Pt in for evaluation.  Pt states his sugars at home have been very high over past week. Pt states over past week his sugars have been lowest 340 at best(in morning). However but many of his readings are over 400. 2 days ago he got reading to high to read.   Pt states last 2 weeks he has very dry mouth. He states not urinating frequently. About one month of bilateral CVA angle pain.  Pt recent increased his metformin to tid.    Review of Systems  Constitutional: Negative for chills, fatigue and fever.  Respiratory: Positive for cough. Negative for chest tightness, shortness of breath and wheezing.        Mild intermittent dry cough.  Gastrointestinal: Negative for abdominal distention, abdominal pain, blood in stool, constipation, nausea and vomiting.  Endocrine: Positive for polydipsia. Negative for polyphagia and polyuria.       Very dry mouth.  Genitourinary: Negative for difficulty urinating, dysuria, frequency, hematuria, penile pain, scrotal swelling and urgency.  Musculoskeletal: Positive for back pain. Negative for arthralgias, joint swelling, myalgias and neck stiffness.  Skin: Negative for rash.  Neurological: Negative for dizziness, numbness and headaches.  Hematological: Negative for adenopathy. Does not bruise/bleed easily.  Psychiatric/Behavioral: Negative for behavioral problems and decreased concentration. The patient is nervous/anxious.        Anxiety before he sings. 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    Past Medical History:  Diagnosis Date  . Allergic angioedema   . Allergy   . Atrial flutter ([place])   . Hypertension   . Pneumonia   . RAD (reactive airway disease)   . Tick bite      Social History   Socioeconomic History  . Marital status: Married    Spouse name: Not on file  . Number of  children: Not on file  . Years of education: Not on file  . Highest education level: Not on file  Occupational History  . Not on file  Social Needs  . Financial resource strain: Not on file  . Food insecurity:    Worry: Not on file    Inability: Not on file  . Transportation needs:    Medical: Not on file    Non-medical: Not on file  Tobacco Use  . Smoking status: Former Smoker    Types: Cigarettes, Pipe    Last attempt to quit: 10/05/1996    Years since quitting: 22.3  . Smokeless tobacco: Former Systems developer    Types: Chew  . Tobacco comment: Occasionally  Substance and Sexual Activity  . Alcohol use: No    Alcohol/week: 0.0 standard drinks  . Drug use: No  . Sexual activity: Yes    Birth control/protection: None  Lifestyle  . Physical activity:    Days per week: Not on file    Minutes per session: Not on file  . Stress: Not on file  Relationships  . Social connections:    Talks on phone: Not on file    Gets together: Not on file    Attends religious service: Not on file    Active member of club or organization: Not on file    Attends meetings of clubs or organizations: Not on file    Relationship status: Not on file  . Intimate partner violence:  Fear of current or ex partner: Not on file    Emotionally abused: Not on file    Physically abused: Not on file    Forced sexual activity: Not on file  Other Topics Concern  . Not on file  Social History Narrative  . Not on file    Past Surgical History:  Procedure Laterality Date  . APPENDECTOMY    . CHOLECYSTECTOMY    . COLONOSCOPY    . stress myoview  12/13/2008   EF 61% LV  normal  . TONSILLECTOMY      Family History  Problem Relation Age of Onset  . Pancreatic cancer Mother   . Hypertension Mother   . Cancer Mother   . Hypertension Sister   . Asthma Son   . Stroke Father   . Hypertension Father   . Hypertension Brother   . Hypertension Daughter   . Cancer Maternal Grandfather   . Colon polyps Neg Hx    . Esophageal cancer Neg Hx   . Rectal cancer Neg Hx   . Stomach cancer Neg Hx     Allergies  Allergen Reactions  . Ibuprofen Swelling  . Lisinopril Swelling    Swelling of face and lips   . Oxaprozin Swelling    Swelling of face and lips  . Ziac [Bisoprolol-Hydrochlorothiazide] Swelling    Per patient records from Dr. Florina Ou.  Thayer Jew Hcl] Itching  . Hctz [Hydrochlorothiazide]   . Norvasc [Amlodipine Besylate]     Current Outpatient Medications on File Prior to Visit  Medication Sig Dispense Refill  . cetirizine (ZYRTEC) 10 MG tablet Take 10 mg by mouth daily.    . chlorthalidone (HYGROTON) 25 MG tablet Take 1 tablet (25 mg total) by mouth daily. 90 tablet 3  . diltiazem (CARDIZEM CD) 300 MG 24 hr capsule TAKE ONE CAPSULE BY MOUTH EVERY DAY 90 capsule 3  . metFORMIN (GLUCOPHAGE-XR) 500 MG 24 hr tablet Take 2 tablets (1,000 mg total) by mouth 2 (two) times daily. 360 tablet 3  . pravastatin (PRAVACHOL) 20 MG tablet Take 1 tablet (20 mg total) by mouth daily. 90 tablet 3   No current facility-administered medications on file prior to visit.     BP 134/73   Pulse 87   Temp 98.4 F (36.9 C) (Oral)   Resp 16   Ht 6' (1.829 m)   Wt 258 lb (117 kg)   SpO2 96%   BMI 34.99 kg/m       Objective:   Physical Exam  General Mental Status- Alert. General Appearance- Not in acute distress.   Skin General: Color- Normal Color. Moisture- Normal Moisture.  Neck Carotid Arteries- Normal color. Moisture- Normal Moisture. No carotid bruits. No JVD.  Chest and Lung Exam Auscultation: Breath Sounds:-Normal.  Cardiovascular Auscultation:Rythm- Regular. Murmurs & Other Heart Sounds:Auscultation of the heart reveals- No Murmurs.  Abdomen Inspection:-Inspeection Normal. Palpation/Percussion:Note:No mass. Palpation and Percussion of the abdomen reveal- Non Tender, Non Distended + BS, no rebound or guarding.   Neurologic Cranial Nerve exam:- CN III-XII  intact(No nystagmus), symmetric smile. Strength:- 5/5 equal and symmetric strength both upper and lower extremities.      Assessment & Plan:  For your very high sugars recently, we are going to do stat labs to assess if you have any abnormalities that need to be corrected immediately through the emergency department.  You do appear stable presently and discussed your  case with Dr. Charlett Blake.  We decided that we will give you  Basaglar insulin to use 10 units at night and you will use Humalog sliding scale before meals.  You will follow sliding scale given today.  You had recent cough today so we will get chest x-ray to make sure no pneumonia or infection impacting your sugars all of a sudden.  Blood work today would include CBC, CMP and A1c.  I am presently trying to get you scheduled with your PCP Dr. Lorelei Pont tomorrow or on Thursday.  We will review labs today and if we see any abnormality that requires you to be seen in the emergency department they will let you know.  Regarding use of sliding scale insulin very important that if you use insulin that you eat.  Presently you can stop metformin until you see Dr. Lorelei Pont and get her advice on treatment going forward.  Mackie Pai, PA-C

## 2019-02-15 NOTE — Patient Instructions (Addendum)
For your very high sugars recently, we are going to do stat labs to assess if you have any abnormalities that need to be corrected immediately through the emergency department.  You do appear stable presently and discussed your  case with Dr. Charlett Blake.  We decided that we will give you Basaglar insulin to use 10 units at night and you will use Humalog sliding scale before meals.  You will follow sliding scale given today.  You had recent cough today so we will get chest x-ray to make sure no pneumonia or infection impacting your sugars all of a sudden.  Blood work today would include CBC, CMP and A1c.  I am presently trying to get you scheduled with your PCP Dr. Lorelei Pont tomorrow or on Thursday.  We will review labs today and if we see any abnormality that requires you to be seen in the emergency department they will let you know.  Regarding use of sliding scale insulin very important that if you use insulin that you eat.  Presently you can stop metformin until you see Dr. Lorelei Pont and get her advice on treatment going forward.  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 Only 10 tablets rx'd.

## 2019-02-15 NOTE — Addendum Note (Signed)
Addended by: Anabel Halon on: 02/15/2019 09:34 PM   Modules accepted: Orders

## 2019-02-15 NOTE — Progress Notes (Signed)
[facility] [address], [place], [place] [address] [phone]  Date:  02/17/2019   Name:  ULRICH SOULES   DOB:  [birth year]   MRN:  [id number]  PCP:  Darreld Mclean, MD    Chief Complaint: Diabetes (2 follow up, edward gave him sample for humalog 02/15/19)   History of Present Illness:  KAEO JACOME is a 73 y.o. very pleasant male patient who presents with the following:  Very nice gentleman with history of diabetes, hypertension, paroxysmal atrial flutter, who I last saw in October He was having some bowel changes that time, so we had him see gastroenterology He saw Dr. Havery Moros in November, who recommended colonoscopy.  This was completed on November 14. He had some precancerous polyps, and 3-year colonoscopy follow-up was recommended  His last A1c was 9%, he was on minimal dose of metformin at that time.  I had him increase his metformin However he noticed that his glucose was getting too high a couple of weeks ago He noted that he felt dizzy, dry mouth-due to the symptoms he got his blood sugar meter, and notes that his blood sugars are running 300+  He came in for evaluation earlier this week Percell Miller started him on Basaglar 10 units and Humalog sliding scale with meals His A1c came back over 12%, see below  He is just doing sliding scale at home-somewhat difficult for him, as requires him to check his blood sugar very frequently.  He actually did not start on the basilar Klar, he was a little bit confused on how to use it If his glucose is 250 -300 he is taking 10 units of Humalog 15 units if higher, however he has not needed to go this high  His home glucose is generally running 250 Was 350 fasting this am   Eye exam: Due, will have him defer until his blood sugar is under better control Lab Results  Component Value Date   HGBA1C 12.3 (H) 02/15/2019   His wife [name] (dway-la)is being treated  for lymphoma at [facility] is actually doing quite well, getting her strength back.  They were able to go to the beach recently for vacation  Wt Readings from Last 3 Encounters:  02/17/19 255 lb (115.7 kg)  02/15/19 258 lb (117 kg)  10/28/18 262 lb (118.8 kg)    Patient Active Problem List   Diagnosis Date Noted  . Controlled type 2 diabetes mellitus without complication, without long-term current use of insulin ([place]) 04/02/2017  . Left hip pain 02/25/2016  . CMV infection, acute ([place]) 09/11/2015  . Low HDL (under 40) 07/12/2013  . Angioedema 11/08/2012  . Paroxysmal atrial flutter ([place]) 02/04/2012  . Essential hypertension 06/15/2009  . BRONCHOSPASM 06/14/2009    Past Medical History:  Diagnosis Date  . Allergic angioedema   . Allergy   . Atrial flutter ([place])   . Hypertension   . Pneumonia   . RAD (reactive airway disease)   . Tick bite     Past Surgical History:  Procedure Laterality Date  . APPENDECTOMY    . CHOLECYSTECTOMY    . COLONOSCOPY    . stress myoview  12/13/2008   EF 61% LV  normal  . TONSILLECTOMY      Social History   Tobacco Use  . Smoking status: Former Smoker    Types: Cigarettes, Pipe    Last attempt to quit: 10/05/1996  Years since quitting: 22.3  . Smokeless tobacco: Former Systems developer    Types: Chew  . Tobacco comment: Occasionally  Substance Use Topics  . Alcohol use: No    Alcohol/week: 0.0 standard drinks  . Drug use: No    Family History  Problem Relation Age of Onset  . Pancreatic cancer Mother   . Hypertension Mother   . Cancer Mother   . Hypertension Sister   . Asthma Son   . Stroke Father   . Hypertension Father   . Hypertension Brother   . Hypertension Daughter   . Cancer Maternal Grandfather   . Colon polyps Neg Hx   . Esophageal cancer Neg Hx   . Rectal cancer Neg Hx   . Stomach cancer Neg Hx     Allergies  Allergen Reactions  . Ibuprofen Swelling  . Lisinopril Swelling    Swelling of face and lips   .  Oxaprozin Swelling    Swelling of face and lips  . Ziac [Bisoprolol-Hydrochlorothiazide] Swelling    Per patient records from Dr. Florina Ou.  Thayer Jew Hcl] Itching  . Hctz [Hydrochlorothiazide]   . Norvasc [Amlodipine Besylate]     Medication list has been reviewed and updated.  Current Outpatient Medications on File Prior to Visit  Medication Sig Dispense Refill  . ALPRAZolam (XANAX) 0.25 MG tablet Take 1 tablet (0.25 mg total) by mouth 2 (two) times daily as needed for anxiety. (Patient taking differently: Take 0.25 mg by mouth daily as needed for anxiety. ) 10 tablet 0  . cetirizine (ZYRTEC) 10 MG tablet Take 10 mg by mouth daily.    . chlorthalidone (HYGROTON) 25 MG tablet Take 1 tablet (25 mg total) by mouth daily. 90 tablet 3  . diltiazem (CARDIZEM CD) 300 MG 24 hr capsule TAKE ONE CAPSULE BY MOUTH EVERY DAY 90 capsule 3  . pravastatin (PRAVACHOL) 20 MG tablet Take 1 tablet (20 mg total) by mouth daily. 90 tablet 3  . metFORMIN (GLUCOPHAGE-XR) 500 MG 24 hr tablet Take 2 tablets (1,000 mg total) by mouth 2 (two) times daily. (Patient not taking: Reported on 02/17/2019) 360 tablet 3   No current facility-administered medications on file prior to visit.     Review of Systems:  As per HPI- otherwise negative.   Physical Examination: Vitals:   02/17/19 1022  BP: 138/60  Pulse: 90  Resp: 16  Temp: 98.1 F (36.7 C)  SpO2: 98%   Vitals:   02/17/19 1022  Weight: 255 lb (115.7 kg)  Height: 6' 2.25" (1.886 m)   Body mass index is 32.52 kg/m. Ideal Body Weight: Weight in (lb) to have BMI = 25: 195.6  GEN: WDWN, NAD, Non-toxic, A & O x 3, overweight, tall build.  Looks well HEENT: Atraumatic, Normocephalic. Neck supple. No masses, No LAD. Ears and Nose: No external deformity. CV: RRR, No M/G/R. No JVD. No thrill. No extra heart sounds. PULM: CTA B, no wheezes, crackles, rhonchi. No retractions. No resp. distress. No accessory muscle use. EXTR: No c/c/e NEURO  Normal gait.  PSYCH: Normally interactive. Conversant. Not depressed or anxious appearing.  Calm demeanor.    Assessment and Plan: Hyperglycemia - Plan: Insulin Glargine (BASAGLAR KWIKPEN) 100 UNIT/ML SOPN  Following up on hyperglycemia today.  Dec[place] has a history of diabetes which has generally been under reasonable control.  However over the last few months his A1c is gone much higher.  Lab Results  Component Value Date   HGBA1C 12.3 (H) 02/15/2019  We will have him continue metformin, increase to 2000 mg over the course of a week or so He had stopped this medication over the last couple of days, was not sure if he should take it with insulin.  Advised him that he certainly should continue his metformin  We decided to have him change over to once a day insulin glargine.  We will have him start on 10 units, went over how to titrate gradually depending on his fasting sugars. Discussed dietary changes that may be helpful, he will work on consuming fewer sugars and carbs  He is asked to email me in 7 to 10 days with an update.  As we bring his sugar under control, will most likely add another oral agent with hopes of decreasing dependence on insulin.  He has an appointment to see me in April, which should work out nicely  [place], MD

## 2019-02-17 ENCOUNTER — Ambulatory Visit (INDEPENDENT_AMBULATORY_CARE_PROVIDER_SITE_OTHER): Payer: Medicare HMO | Admitting: Family Medicine

## 2019-02-17 ENCOUNTER — Encounter: Payer: Self-pay | Admitting: Family Medicine

## 2019-02-17 VITALS — BP 138/60 | HR 90 | Temp 98.1°F | Resp 16 | Ht 74.25 in | Wt 255.0 lb

## 2019-02-17 DIAGNOSIS — R739 Hyperglycemia, unspecified: Secondary | ICD-10-CM | POA: Diagnosis not present

## 2019-02-17 MED ORDER — PEN NEEDLES 31G X 5 MM MISC: 1.0000 | Freq: Every day | 1 refills | Status: DC

## 2019-02-17 MED ORDER — BASAGLAR KWIKPEN 100 UNIT/ML ~~LOC~~ SOPN: PEN_INJECTOR | SUBCUTANEOUS | 3 refills | Status: DC

## 2019-02-17 NOTE — Patient Instructions (Addendum)
It was great to see you today- so glad to hear that [place] is doing well!!   I wrote you a prescription for Basaglar insulin-this is a once a day, long-acting insulin. For now, you do not need to continue the Humalog.  However you may use 5- 10 in case of very high blood sugars (over 350)  We will adjust your Basaglar as follows: Start with 10 units daily Please check your fasting glucose every morning. Increase your Basaglar by 2 units every 2 days, until your fasting glucose is around 150 For example- 10,10,12,12,14,14,16,16- etc  Do continue taking the Metformin.  Increase to 1000 mg twice a day over the course of about a week Do work on your diet as well, decreased sugars and carbs will be helpful.  Increase vegetables, lean protein.  Please send me a MyChart message in 7 to 10 days with an update about your progress Once we get your sugars more under control, we will plan to add another oral medication.  We will use oral meds and insulin as needed to strike a good balance and get your A1c down while using as little insulin as we cal

## 2019-02-28 ENCOUNTER — Encounter: Payer: Self-pay | Admitting: Family Medicine

## 2019-03-29 ENCOUNTER — Other Ambulatory Visit: Payer: Self-pay | Admitting: Family Medicine

## 2019-03-29 DIAGNOSIS — I10 Essential (primary) hypertension: Secondary | ICD-10-CM

## 2019-03-29 DIAGNOSIS — E785 Hyperlipidemia, unspecified: Secondary | ICD-10-CM

## 2019-04-08 ENCOUNTER — Telehealth: Payer: Self-pay | Admitting: Family Medicine

## 2019-04-08 ENCOUNTER — Telehealth: Payer: Self-pay

## 2019-04-08 NOTE — Telephone Encounter (Signed)
Copied from [place] [id number]. Topic: General - Other >> Apr 07, 2019  4:02 PM Leward Quan A wrote: Reason for CRM: Patient called to inquire from Dr Lorelei Pont should he reschedule his appointment for Monday 04/11/2019 or does she want him to come in and do his labs then they can do a virtual visit to discuss the results please call patient at Ph# [phone]

## 2019-04-08 NOTE — Telephone Encounter (Signed)
Does patient need to come into the office for lab work to check blood sugar? Patient is okay with it either way. He was concerned since we rescheduled CPE until June. Patient stated he feels okay. Please communicate via mychart.

## 2019-04-11 ENCOUNTER — Encounter: Payer: Self-pay | Admitting: Family Medicine

## 2019-04-11 ENCOUNTER — Encounter: Payer: Medicare HMO | Admitting: Family Medicine

## 2019-04-11 NOTE — Telephone Encounter (Signed)
Patient can do virtual visit, please advise if labs are urgent. I can reschedule labs for further date as we are trying to minimize patients in lab.

## 2019-04-26 ENCOUNTER — Other Ambulatory Visit: Payer: Self-pay | Admitting: Family Medicine

## 2019-04-26 DIAGNOSIS — I10 Essential (primary) hypertension: Secondary | ICD-10-CM

## 2019-04-29 ENCOUNTER — Other Ambulatory Visit: Payer: Self-pay | Admitting: Family Medicine

## 2019-04-29 DIAGNOSIS — R739 Hyperglycemia, unspecified: Secondary | ICD-10-CM

## 2019-04-29 MED ORDER — METFORMIN HCL ER 500 MG PO TB24: 1000.0000 mg | ORAL_TABLET | Freq: Two times a day (BID) | ORAL | 1 refills | Status: DC

## 2019-04-29 NOTE — Telephone Encounter (Signed)
Rx sent 

## 2019-04-29 NOTE — Telephone Encounter (Signed)
Copied from [place] [phone]. Topic: Quick Communication - Rx Refill/Question >> Apr 29, 2019  9:44 AM Erick Blinks wrote: Medication: metFORMIN [facility]) -please advise if appt is necessary.   Best Contact: [phone]  Has the patient contacted their pharmacy? yes (Agent: If no, request that the patient contact the pharmacy for the refill.) (Agent: If yes, when and what did the pharmacy advise?)  Preferred Pharmacy (with phone number or street name): [id number] - [place], [place] [address] [place] [address]    Agent: Please be advised that RX refills may take up to 3 business days. We ask that you follow-up with your pharmacy.

## 2019-05-02 ENCOUNTER — Encounter: Payer: Self-pay | Admitting: Family Medicine

## 2019-05-20 NOTE — Progress Notes (Addendum)
[place] at [facility] [address], [place], [place] [address] [phone]  Date:  05/23/2019   Name:  [name]   DOB:  [birth year]   MRN:  [id number]  PCP:  Darreld Mclean, MD    Chief Complaint: Annual Exam   History of Present Illness:  [name] is a 73 y.o. very pleasant male patient who presents with the following:  Here today for complete physical History of diabetes, paroxysmal atrial fibrillation, hypertension, dyslipidemia We did a virtual visit in March of this year due to sudden increase in his blood sugars.  He start insulin at that time  His wife [name] is being treated him for lymphoma at [facility].  Her treatment has been successful! This is great news.  Right now she does not have to do any treatment. They are seeing her every 3 months for the next year.  She is getting her strength back, and is in good spirits  At our last virtual visit he was trying to do sliding scale insulin and also insulin glargine.  I had him change to only insulin glargine, with gradual titration.  Also increased his metformin to 1500 total daily Since her last visit he has worked hard on his diet lifestyle, and his sugar has really improved. He was able to come off insulin.  His home glucose may run about 112 or 120.   No low glucose noted  He has changed his diet- is paying more attention to what he eats He is fasting this am for labs   Wt Readings from Last 3 Encounters:  05/23/19 246 lb (111.6 kg)  02/17/19 255 lb (115.7 kg)  02/15/19 258 lb (117 kg)   He has lost some weight   Alprazolam as needed Chlorthalidone 25 Diltiazem 300 Insulin glargine- NOT taking Metformin 1000 am and 500 m Pravastatin  Eye exam: he is due, will do asap  Foot exam: Can do today Colonoscopy: Up-to-date, 2019 Labs: Does not need liver function or blood counts today, otherwise due Immunization: Suggest Shingrix  He does note some  periodic numbness in his feet.  No pain however.  No claudication Patient Active Problem List   Diagnosis Date Noted  . Controlled type 2 diabetes mellitus without complication, without long-term current use of insulin ([place]) 04/02/2017  . Left hip pain 02/25/2016  . CMV infection, acute ([place]) 09/11/2015  . Low HDL (under 40) 07/12/2013  . Angioedema 11/08/2012  . Paroxysmal atrial flutter ([place]) 02/04/2012  . Essential hypertension 06/15/2009  . BRONCHOSPASM 06/14/2009    Past Medical History:  Diagnosis Date  . Allergic angioedema   . Allergy   . Atrial flutter ([place])   . Hypertension   . Pneumonia   . RAD (reactive airway disease)   . Tick bite     Past Surgical History:  Procedure Laterality Date  . APPENDECTOMY    . CHOLECYSTECTOMY    . COLONOSCOPY    . stress myoview  12/13/2008   EF 61% LV  normal  . TONSILLECTOMY      Social History   Tobacco Use  . Smoking status: Former Smoker    Types: Cigarettes, Pipe    Last attempt to quit: 10/05/1996    Years since quitting: 22.6  . Smokeless tobacco: Former Systems developer    Types: Chew  . Tobacco comment: Occasionally  Substance Use Topics  . Alcohol use: No    Alcohol/week: 0.0 standard  drinks  . Drug use: No    Family History  Problem Relation Age of Onset  . Pancreatic cancer Mother   . Hypertension Mother   . Cancer Mother   . Hypertension Sister   . Asthma Son   . Stroke Father   . Hypertension Father   . Hypertension Brother   . Hypertension Daughter   . Cancer Maternal Grandfather   . Colon polyps Neg Hx   . Esophageal cancer Neg Hx   . Rectal cancer Neg Hx   . Stomach cancer Neg Hx     Allergies  Allergen Reactions  . Ibuprofen Swelling  . Lisinopril Swelling    Swelling of face and lips   . Oxaprozin Swelling    Swelling of face and lips  . Ziac [Bisoprolol-Hydrochlorothiazide] Swelling    Per patient records from Dr. Florina Ou.  [facility] Jew Hcl] Itching  . Hctz  [Hydrochlorothiazide]   . Norvasc [Amlodipine Besylate]     Medication list has been reviewed and updated.  Current Outpatient Medications on File Prior to Visit  Medication Sig Dispense Refill  . ALPRAZolam (XANAX) 0.25 MG tablet Take 1 tablet (0.25 mg total) by mouth 2 (two) times daily as needed for anxiety. (Patient taking differently: Take 0.25 mg by mouth daily as needed for anxiety. ) 10 tablet 0  . cetirizine (ZYRTEC) 10 MG tablet Take 10 mg by mouth daily.    . chlorthalidone (HYGROTON) 25 MG tablet TAKE 1 TABLET BY MOUTH EVERY DAY 90 tablet 1  . diltiazem (CARDIZEM CD) 300 MG 24 hr capsule TAKE ONE CAPSULE BY MOUTH EVERY DAY 90 capsule 3  . Insulin Glargine (BASAGLAR KWIKPEN) 100 UNIT/ML SOPN Start with 10 units daily, and titrate up as needed.  Projected goal dose of 25 to 30 units/day 15 mL 3  . Insulin Pen Needle (PEN NEEDLES) 31G X 5 MM MISC 1 each by Does not apply route daily. 30 each 1  . metFORMIN (GLUCOPHAGE-XR) 500 MG 24 hr tablet Take 2 tablets (1,000 mg total) by mouth 2 (two) times daily. 360 tablet 1  . pravastatin (PRAVACHOL) 20 MG tablet TAKE 1 TABLET BY MOUTH EVERY DAY 90 tablet 3   No current facility-administered medications on file prior to visit.     Review of Systems:  As per HPI- otherwise negative. No CP or SOB No claudication sx   Physical Examination: Vitals:   05/23/19 0938  BP: 124/60  Pulse: 77  Resp: 16  Temp: 98.3 F (36.8 C)  SpO2: 97%   Vitals:   05/23/19 0938  Weight: 246 lb (111.6 kg)  Height: 6\' 2"  (1.88 m)   Body mass index is 31.58 kg/m. Ideal Body Weight: Weight in (lb) to have BMI = 25: 194.3  GEN: WDWN, NAD, Non-toxic, A & O x 3, overweight, tall build.  Looks well HEENT: Atraumatic, Normocephalic. Neck supple. No masses, No LAD. Ears and Nose: No external deformity. CV: RRR, No M/G/R. No JVD. No thrill. No extra heart sounds. PULM: CTA B, no wheezes, crackles, rhonchi. No retractions. No resp. distress. No accessory  muscle use. ABD: S, NT, ND, +BS. No rebound. No HSM. EXTR: No c/c/e NEURO Normal gait.  PSYCH: Normally interactive. Conversant. Not depressed or anxious appearing.  Calm demeanor.  Foot exam: Cannot palpate dorsalis pedis pulses in either foot, but posterior tibial pulses are normal.  Foot exam otherwise normal  Assessment and Plan: Physical exam  Essential hypertension - Plan: Basic metabolic panel  Controlled  type 2 diabetes mellitus without complication, without long-term current use of insulin ([place]) - Plan: Hemoglobin A1c, Microalbumin / creatinine urine ratio  Dyslipidemia - Plan: Lipid panel  Screening for prostate cancer - Plan: PSA  Physical exam today Blood pressure well controlled on current regimen He is actually stopped using insulin.  We will check his A1c today. Continue Pravachol for hyperlipidemia. Cannot palpate dorsalis pedis pulses.  We did ABI for him last fall which was normal, and he has no symptoms of claudication  Will plan further follow- up pending labs.  Signed Lamar Blinks, MD  Received his labs- message to pt  Results for orders placed or performed in visit on 37/04/88  Basic metabolic panel  Result Value Ref Range   Sodium 138 135 - 145 mEq/L   Potassium 4.5 3.5 - 5.1 mEq/L   Chloride 101 96 - 112 mEq/L   CO2 31 19 - 32 mEq/L   Glucose, Bld 132 (H) 70 - 99 mg/dL   BUN 20 6 - 23 mg/dL   Creatinine, Ser 1.05 0.40 - 1.50 mg/dL   Calcium 9.6 8.4 - 10.5 mg/dL   GFR 69.72 >60.00 mL/min  Hemoglobin A1c  Result Value Ref Range   Hgb A1c MFr Bld 6.9 (H) 4.6 - 6.5 %  Lipid panel  Result Value Ref Range   Cholesterol 146 0 - 200 mg/dL   Triglycerides 98.0 0.0 - 149.0 mg/dL   HDL 34.30 (L) >39.00 mg/dL   VLDL 19.6 0.0 - 40.0 mg/dL   LDL Cholesterol 92 0 - 99 mg/dL   Total CHOL/HDL Ratio 4    NonHDL 111.61   PSA  Result Value Ref Range   PSA 0.85 0.10 - 4.00 ng/mL  Microalbumin / creatinine urine ratio  Result Value Ref Range    Microalb, Ur 1.1 0.0 - 1.9 mg/dL   Creatinine,U 157.7 mg/dL   Microalb Creat Ratio 0.7 0.0 - 30.0 mg/g

## 2019-05-20 NOTE — Patient Instructions (Addendum)
It was great to see you today I will be in touch with your labs asap  Please consider getting the Shingrix vaccine at your drugstore-this is a 2 shot series to prevent shingles As we discussed, we can't feel the pulse in the back of your feet but as you had that circulation test year we are ok. Watch for any signs of claudication- this is pain and cramping of the calves with exercise    Health Maintenance After Age 71 After age 48, you are at a higher risk for certain long-term diseases and infections as well as injuries from falls. Falls are a major cause of broken bones and head injuries in people who are older than age 54. Getting regular preventive care can help to keep you healthy and well. Preventive care includes getting regular testing and making lifestyle changes as recommended by your health care provider. Talk with your health care provider about:  Which screenings and tests you should have. A screening is a test that checks for a disease when you have no symptoms.  A diet and exercise plan that is right for you. What should I know about screenings and tests to prevent falls? Screening and testing are the best ways to find a health problem early. Early diagnosis and treatment give you the best chance of managing medical conditions that are common after age 33. Certain conditions and lifestyle choices may make you more likely to have a fall. Your health care provider may recommend:  Regular vision checks. Poor vision and conditions such as cataracts can make you more likely to have a fall. If you wear glasses, make sure to get your prescription updated if your vision changes.  Medicine review. Work with your health care provider to regularly review all of the medicines you are taking, including over-the-counter medicines. Ask your health care provider about any side effects that may make you more likely to have a fall. Tell your health care provider if any medicines that you take make you  feel dizzy or sleepy.  Osteoporosis screening. Osteoporosis is a condition that causes the bones to get weaker. This can make the bones weak and cause them to break more easily.  Blood pressure screening. Blood pressure changes and medicines to control blood pressure can make you feel dizzy.  Strength and balance checks. Your health care provider may recommend certain tests to check your strength and balance while standing, walking, or changing positions.  Foot health exam. Foot pain and numbness, as well as not wearing proper footwear, can make you more likely to have a fall.  Depression screening. You may be more likely to have a fall if you have a fear of falling, feel emotionally low, or feel unable to do activities that you used to do.  Alcohol use screening. Using too much alcohol can affect your balance and may make you more likely to have a fall. What actions can I take to lower my risk of falls? General instructions  Talk with your health care provider about your risks for falling. Tell your health care provider if: ? You fall. Be sure to tell your health care provider about all falls, even ones that seem minor. ? You feel dizzy, sleepy, or off-balance.  Take over-the-counter and prescription medicines only as told by your health care provider. These include any supplements.  Eat a healthy diet and maintain a healthy weight. A healthy diet includes low-fat dairy products, low-fat (lean) meats, and fiber from whole grains, beans,  and lots of fruits and vegetables. Home safety  Remove any tripping hazards, such as rugs, cords, and clutter.  Install safety equipment such as grab bars in bathrooms and safety rails on stairs.  Keep rooms and walkways well-lit. Activity   Follow a regular exercise program to stay fit. This will help you maintain your balance. Ask your health care provider what types of exercise are appropriate for you.  If you need a cane or walker, use it as  recommended by your health care provider.  Wear supportive shoes that have nonskid soles. Lifestyle  Do not drink alcohol if your health care provider tells you not to drink.  If you drink alcohol, limit how much you have: ? 0-1 drink a day for women. ? 0-2 drinks a day for men.  Be aware of how much alcohol is in your drink. In the [place] one drink equals one typical bottle of beer (12 oz), one-half glass of wine (5 oz), or one shot of hard liquor (1 oz).  Do not use any products that contain nicotine or tobacco, such as cigarettes and e-cigarettes. If you need help quitting, ask your health care provider. Summary  Having a healthy lifestyle and getting preventive care can help to protect your health and wellness after age 80.  Screening and testing are the best way to find a health problem early and help you avoid having a fall. Early diagnosis and treatment give you the best chance for managing medical conditions that are more common for people who are older than age 67.  Falls are a major cause of broken bones and head injuries in people who are older than age 52. Take precautions to prevent a fall at home.  Work with your health care provider to learn what changes you can make to improve your health and wellness and to prevent falls. This information is not intended to replace advice given to you by your health care provider. Make sure you discuss any questions you have with your health care provider. Document Released: 10/14/2017 Document Revised: 10/14/2017 Document Reviewed: 10/14/2017 Elsevier Interactive Patient Education  2019 [employer].

## 2019-05-23 ENCOUNTER — Encounter: Payer: Self-pay | Admitting: Family Medicine

## 2019-05-23 ENCOUNTER — Other Ambulatory Visit: Payer: Self-pay

## 2019-05-23 ENCOUNTER — Ambulatory Visit (INDEPENDENT_AMBULATORY_CARE_PROVIDER_SITE_OTHER): Payer: Medicare HMO | Admitting: Family Medicine

## 2019-05-23 VITALS — BP 124/60 | HR 77 | Temp 98.3°F | Resp 16 | Ht 74.0 in | Wt 246.0 lb

## 2019-05-23 DIAGNOSIS — Z0000 Encounter for general adult medical examination without abnormal findings: Secondary | ICD-10-CM | POA: Diagnosis not present

## 2019-05-23 DIAGNOSIS — E785 Hyperlipidemia, unspecified: Secondary | ICD-10-CM

## 2019-05-23 DIAGNOSIS — Z125 Encounter for screening for malignant neoplasm of prostate: Secondary | ICD-10-CM | POA: Diagnosis not present

## 2019-05-23 DIAGNOSIS — E119 Type 2 diabetes mellitus without complications: Secondary | ICD-10-CM | POA: Diagnosis not present

## 2019-05-23 DIAGNOSIS — I10 Essential (primary) hypertension: Secondary | ICD-10-CM

## 2019-05-23 LAB — HEMOGLOBIN A1C: Hgb A1c MFr Bld: 6.9 % — ABNORMAL HIGH (ref 4.6–6.5)

## 2019-05-23 LAB — BASIC METABOLIC PANEL
BUN: 20 mg/dL (ref 6–23)
CO2: 31 mEq/L (ref 19–32)
Calcium: 9.6 mg/dL (ref 8.4–10.5)
Chloride: 101 mEq/L (ref 96–112)
Creatinine, Ser: 1.05 mg/dL (ref 0.40–1.50)
GFR: 69.72 mL/min (ref >60.00)
Glucose, Bld: 132 mg/dL — ABNORMAL HIGH (ref 70–99)
Potassium: 4.5 mEq/L (ref 3.5–5.1)
Sodium: 138 mEq/L (ref 135–145)

## 2019-05-23 LAB — PSA: PSA: 0.85 ng/mL (ref 0.10–4.00)

## 2019-05-23 LAB — LIPID PANEL
Cholesterol: 146 mg/dL (ref 0–200)
HDL: 34.3 mg/dL — ABNORMAL LOW (ref >39.00)
LDL Cholesterol: 92 mg/dL (ref 0–99)
NonHDL: 111.61
Total CHOL/HDL Ratio: 4
Triglycerides: 98 mg/dL (ref 0.0–149.0)
VLDL: 19.6 mg/dL (ref 0.0–40.0)

## 2019-05-23 LAB — MICROALBUMIN / CREATININE URINE RATIO
Creatinine,U: 157.7 mg/dL
Microalb Creat Ratio: 0.7 mg/g (ref 0.0–30.0)
Microalb, Ur: 1.1 mg/dL (ref 0.0–1.9)

## 2019-05-23 NOTE — Addendum Note (Signed)
Addended by: Lamar Blinks C on: 05/23/2019 05:12 PM   Modules accepted: Orders

## 2019-06-08 ENCOUNTER — Other Ambulatory Visit: Payer: Self-pay

## 2019-06-08 ENCOUNTER — Ambulatory Visit (INDEPENDENT_AMBULATORY_CARE_PROVIDER_SITE_OTHER): Payer: Medicare HMO | Admitting: Family Medicine

## 2019-06-08 ENCOUNTER — Encounter: Payer: Self-pay | Admitting: Family Medicine

## 2019-06-08 VITALS — Ht 74.0 in | Wt 240.0 lb

## 2019-06-08 DIAGNOSIS — M25552 Pain in left hip: Secondary | ICD-10-CM | POA: Diagnosis not present

## 2019-06-08 MED ORDER — HYDROCODONE-ACETAMINOPHEN 5-325 MG PO TABS: 1.0000 | ORAL_TABLET | Freq: Four times a day (QID) | ORAL | 0 refills | Status: DC | PRN

## 2019-06-08 NOTE — Progress Notes (Signed)
Virtual Visit via Telephone Note  I connected with [name] on 06/08/19 at  3:00 PM EDT by telephone and verified that I am speaking with the correct person using two identifiers.  Location: Patient: Home Provider: [place]   I discussed the limitations, risks, security and privacy concerns of performing an evaluation and management service by telephone and the availability of in person appointments. I also discussed with the patient that there may be a patient responsible charge related to this service. The patient expressed understanding and agreed to proceed.   History of Present Illness: Patient has had intermittent problems with left hip anteriorly. He is still lifting, active outside the house. No swelling or bruising. Causing him to limp at times. He's allergic to NSAIDs - has been taking tylenol and icing.  Still has some left over hydrocodone from 3 years ago - takes rarely if this flares up and helps. Feels like hip locks/catches at times. No radiation of pain.   Observations/Objective: NAD, speaking in full sentences  Assessment and Plan: Left hip flexor strain - he still knows exercises from last visit - encouraged to do these.  Icing.  Will send in short supply of hydrocodone to take as needed (reviewed narcotic database before prescribing).  Advised if not improving over next several days would need to evaluate in the office.    I discussed the assessment and treatment plan with the patient. The patient was provided an opportunity to ask questions and all were answered. The patient agreed with the plan and demonstrated an understanding of the instructions.   The patient was advised to call back or seek an in-person evaluation if the symptoms worsen or if the condition fails to improve as anticipated.  I provided 13 minutes of non-face-to-face time during this encounter.   Karlton Lemon, MD

## 2019-07-31 IMAGING — DX DG ABDOMEN ACUTE W/ 1V CHEST
5 series · 5 of 5 positions shown · non-contrast
Comparison: 11/13/2015

CLINICAL DATA: Pelvic pain for 1 month

EXAM:
DG ABDOMEN ACUTE W/ 1V CHEST

[chest pa]
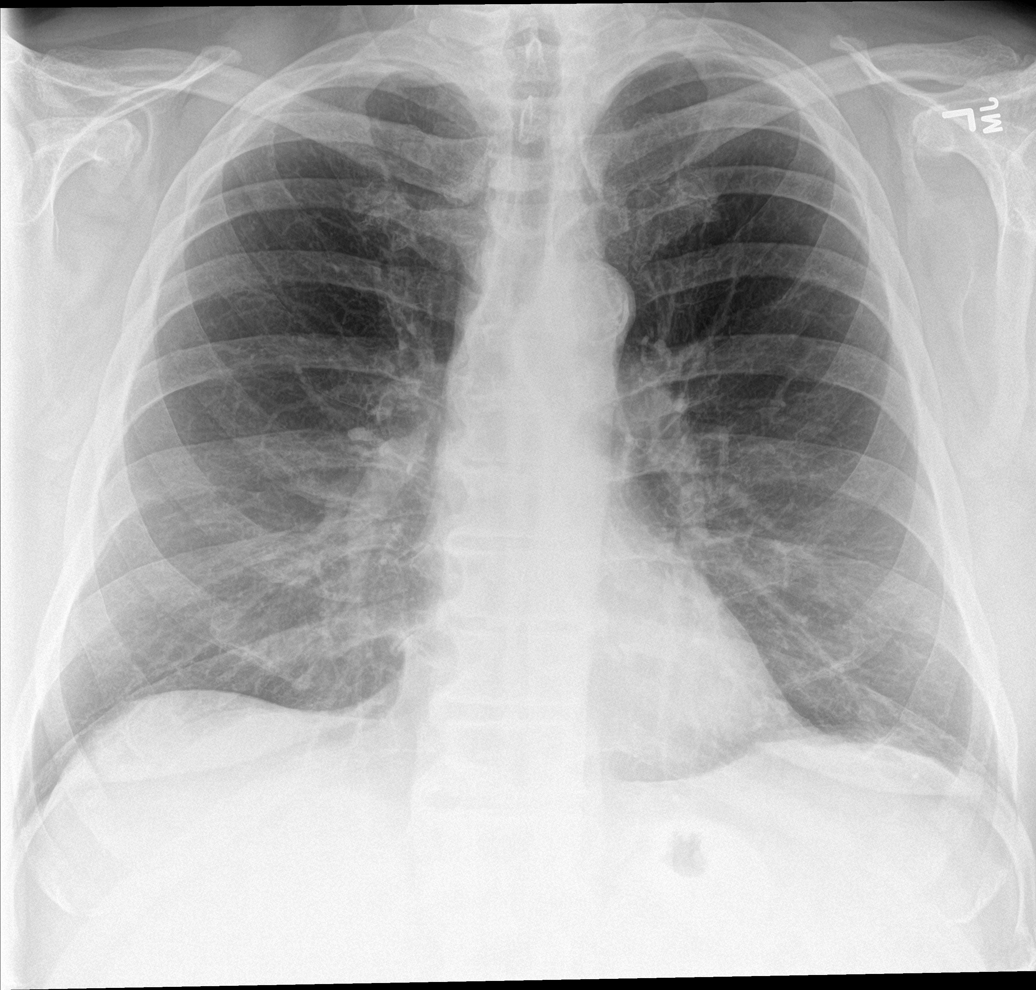

[abdomen erect]
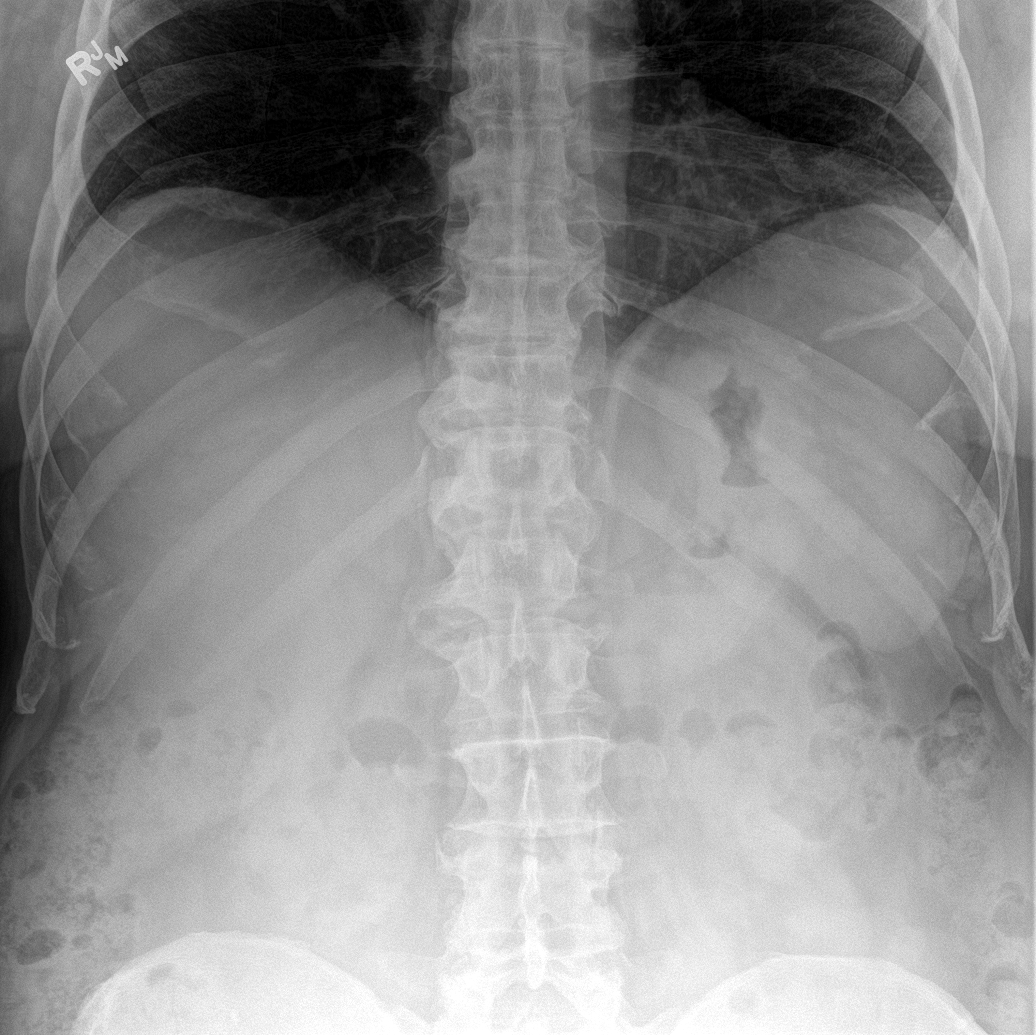

[abdomen supine (1 of 3)]
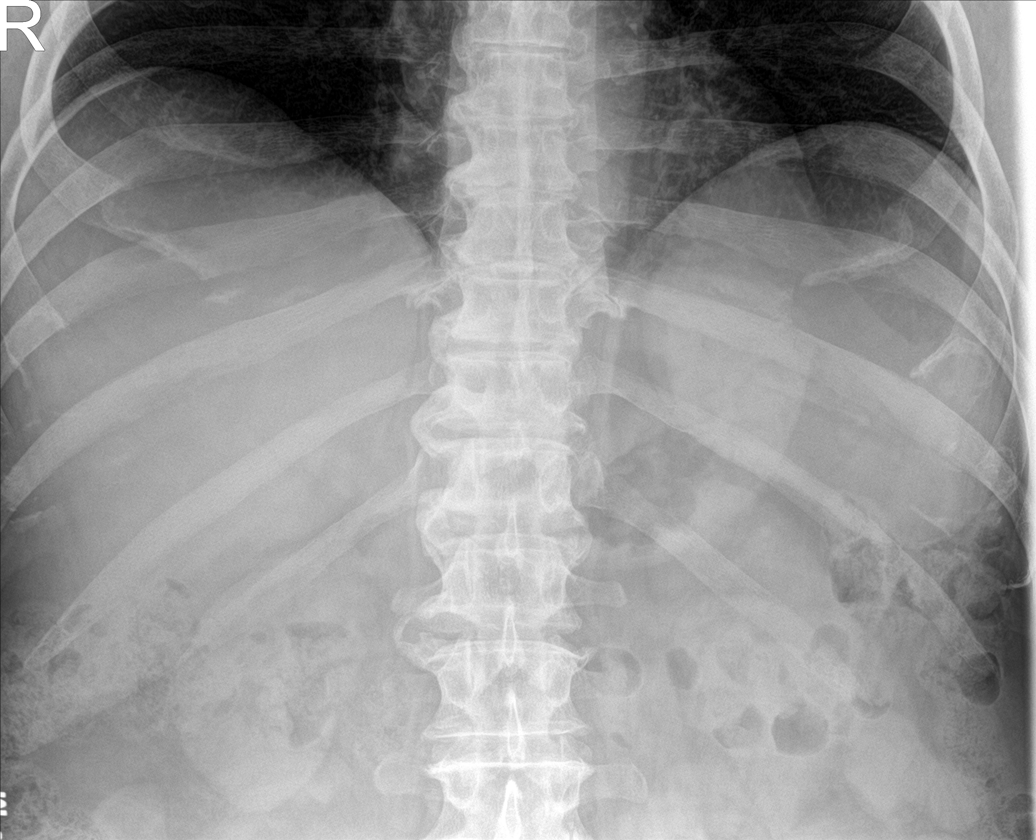

[abdomen supine (2 of 3)]
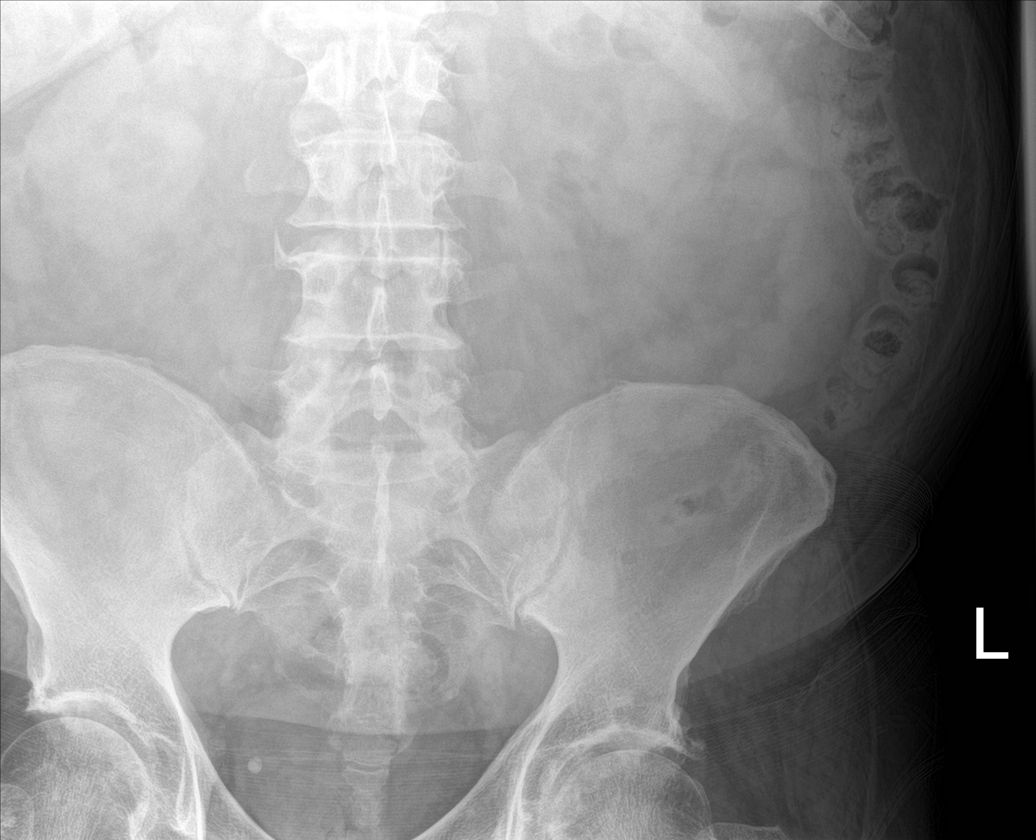

[abdomen supine (3 of 3)]
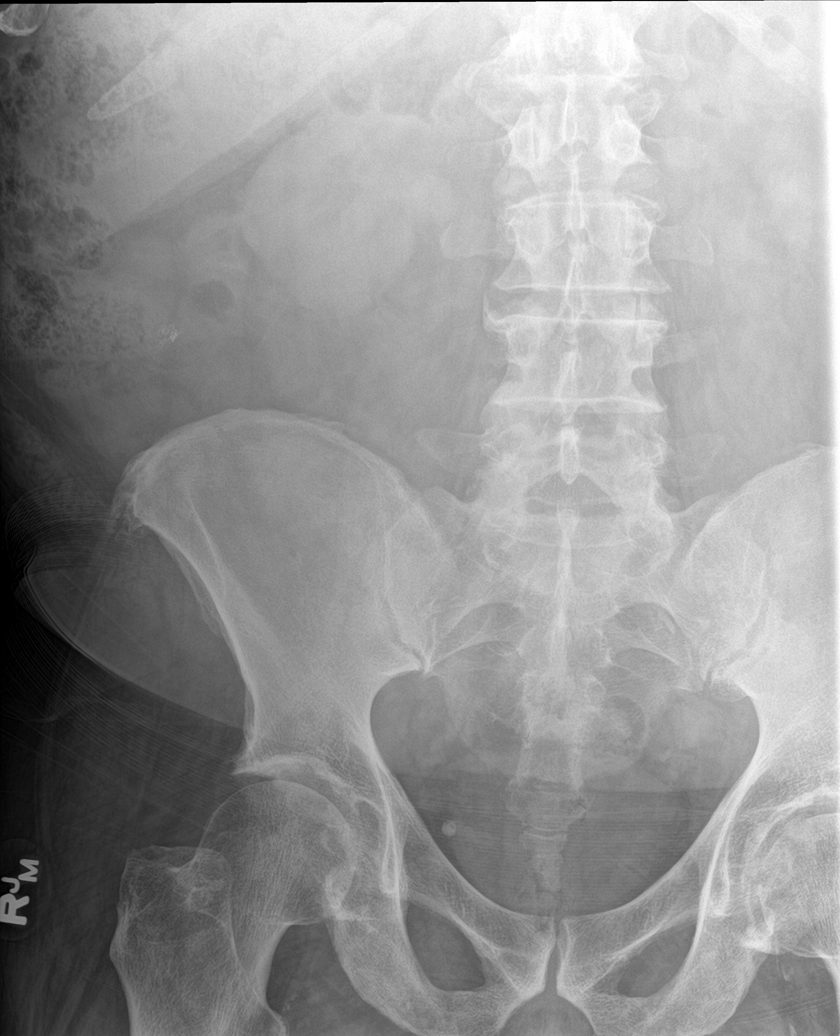

[5 of 5 positions shown; findings below may reference images not displayed]

FINDINGS: Cardiac shadow is within normal limits. Aortic calcifications are
noted. The lungs are well aerated bilaterally.

Scattered large and small bowel gas is noted. Mild retained fecal
material is seen without obstructive change. No abnormal mass or
abnormal calcifications are noted. Degenerative changes of lumbar
spine and hip joints are noted. No free air is seen.
IMPRESSION: No acute abnormality noted.

## 2019-09-22 ENCOUNTER — Emergency Department (HOSPITAL_COMMUNITY)
Admission: EM | Admit: 2019-09-22 | Discharge: 2019-09-22 | Disposition: A | Discharge status: Home / Self Care | Payer: Medicare HMO | Attending: Emergency Medicine | Admitting: Emergency Medicine

## 2019-09-22 ENCOUNTER — Telehealth: Payer: Self-pay | Admitting: Internal Medicine

## 2019-09-22 ENCOUNTER — Emergency Department (HOSPITAL_COMMUNITY): Payer: Medicare HMO

## 2019-09-22 ENCOUNTER — Other Ambulatory Visit: Payer: Self-pay

## 2019-09-22 ENCOUNTER — Encounter (HOSPITAL_COMMUNITY): Payer: Self-pay | Admitting: Emergency Medicine

## 2019-09-22 DIAGNOSIS — I4892 Unspecified atrial flutter: Secondary | ICD-10-CM

## 2019-09-22 DIAGNOSIS — E119 Type 2 diabetes mellitus without complications: Secondary | ICD-10-CM | POA: Diagnosis not present

## 2019-09-22 DIAGNOSIS — R531 Weakness: Secondary | ICD-10-CM | POA: Diagnosis not present

## 2019-09-22 DIAGNOSIS — Z794 Long term (current) use of insulin: Secondary | ICD-10-CM | POA: Diagnosis not present

## 2019-09-22 DIAGNOSIS — R002 Palpitations: Secondary | ICD-10-CM | POA: Diagnosis not present

## 2019-09-22 DIAGNOSIS — Z87891 Personal history of nicotine dependence: Secondary | ICD-10-CM | POA: Insufficient documentation

## 2019-09-22 DIAGNOSIS — Z79899 Other long term (current) drug therapy: Secondary | ICD-10-CM | POA: Insufficient documentation

## 2019-09-22 DIAGNOSIS — R000 Tachycardia, unspecified: Secondary | ICD-10-CM | POA: Diagnosis not present

## 2019-09-22 LAB — CBC
HCT: 45.6 % (ref 39.0–52.0)
Hemoglobin: 16.7 g/dL (ref 13.0–17.0)
MCH: 31.9 pg (ref 26.0–34.0)
MCHC: 36.6 g/dL — ABNORMAL HIGH (ref 30.0–36.0)
MCV: 87 fL (ref 80.0–100.0)
Platelets: 329 10*3/uL (ref 150–400)
RBC: 5.24 MIL/uL (ref 4.22–5.81)
RDW: 12.8 % (ref 11.5–15.5)
WBC: 10 10*3/uL (ref 4.0–10.5)
nRBC: 0 % (ref 0.0–0.2)

## 2019-09-22 LAB — BASIC METABOLIC PANEL
Anion gap: 12 (ref 5–15)
BUN: 21 mg/dL (ref 8–23)
CO2: 26 mmol/L (ref 22–32)
Calcium: 9.2 mg/dL (ref 8.9–10.3)
Chloride: 97 mmol/L — ABNORMAL LOW (ref 98–111)
Creatinine, Ser: 1.26 mg/dL — ABNORMAL HIGH (ref 0.61–1.24)
GFR calc Af Amer: >60 mL/min (ref >60)
GFR calc non Af Amer: 57 mL/min — ABNORMAL LOW (ref >60)
Glucose, Bld: 224 mg/dL — ABNORMAL HIGH (ref 70–99)
Potassium: 3.8 mmol/L (ref 3.5–5.1)
Sodium: 135 mmol/L (ref 135–145)

## 2019-09-22 LAB — MAGNESIUM: Magnesium: 1.7 mg/dL (ref 1.7–2.4)

## 2019-09-22 LAB — TROPONIN I (HIGH SENSITIVITY)
Troponin I (High Sensitivity): 15 ng/L (ref <18)
Troponin I (High Sensitivity): 15 ng/L (ref <18)

## 2019-09-22 MED ORDER — FENTANYL CITRATE (PF) 100 MCG/2ML IJ SOLN
50.0000 ug | Freq: Once | INTRAMUSCULAR | Status: AC
  Administered 2019-09-22: 50 ug via INTRAVENOUS
  Filled 2019-09-22: qty 2

## 2019-09-22 MED ORDER — DILTIAZEM HCL 25 MG/5ML IV SOLN
25.0000 mg | Freq: Once | INTRAVENOUS | Status: AC
  Administered 2019-09-22: 25 mg via INTRAVENOUS

## 2019-09-22 MED ORDER — DILTIAZEM HCL-DEXTROSE 125-5 MG/125ML-% IV SOLN (PREMIX)
5.0000 mg/h | INTRAVENOUS | Status: DC
  Administered 2019-09-22: 5 mg/h via INTRAVENOUS
  Filled 2019-09-22: qty 125

## 2019-09-22 MED ORDER — SODIUM CHLORIDE 0.9% FLUSH: 3.0000 mL | INTRAVENOUS | Status: DC | PRN

## 2019-09-22 MED ORDER — SODIUM CHLORIDE 0.9% FLUSH
3.0000 mL | Freq: Once | INTRAVENOUS | Status: AC
  Administered 2019-09-22: 3 mL via INTRAVENOUS

## 2019-09-22 MED ORDER — APIXABAN 5 MG PO TABS: 5.0000 mg | ORAL_TABLET | Freq: Two times a day (BID) | ORAL | 0 refills | Status: DC

## 2019-09-22 MED ORDER — SODIUM CHLORIDE 0.9% FLUSH
3.0000 mL | Freq: Two times a day (BID) | INTRAVENOUS | Status: DC
  Administered 2019-09-22: 3 mL via INTRAVENOUS

## 2019-09-22 MED ORDER — SODIUM CHLORIDE 0.9 % IV SOLN
250.0000 mL | INTRAVENOUS | Status: DC
  Administered 2019-09-22: 250 mL via INTRAVENOUS

## 2019-09-22 MED ORDER — PROPOFOL 10 MG/ML IV BOLUS
0.5000 mg/kg | Freq: Once | INTRAVENOUS | Status: AC
  Administered 2019-09-22: 40 mg via INTRAVENOUS
  Filled 2019-09-22: qty 20

## 2019-09-22 NOTE — Telephone Encounter (Signed)
Needs ER follow-up appt with me or APP in 1-2 weeks in office.  Dr Lemmie Evens

## 2019-09-22 NOTE — ED Notes (Signed)
Patient Alert and oriented to baseline. Stable and ambulatory to baseline. Patient verbalized understanding of the discharge instructions.  Patient belongings were taken by the patient.   

## 2019-09-22 NOTE — ED Triage Notes (Signed)
Pt reports he felt his heart beating fast last night and it was in the 120's. Pt was hoping a good nights sleep would help it slow down however this morning he woke up and his heart rate was in the 160's.

## 2019-09-22 NOTE — Telephone Encounter (Signed)
Spoke with patient and he had HR of 120 last night. This am HR 160. Patient has history of Atrial flutter. Advised to go to [facility] ED. Discussed with Doreene Burke PA and agreed with plan

## 2019-09-22 NOTE — ED Notes (Signed)
40 mg propofol given 

## 2019-09-22 NOTE — ED Provider Notes (Signed)
[facility] EMERGENCY DEPARTMENT Provider Note   CSN: [id number] Arrival date & time: 09/22/19  0841     History   Chief Complaint Chief Complaint  Patient presents with   A-Fib RVR    HPI [name] is a 73 y.o. male.     HPI   73 year old male with palpitations and generalized weakness.  Acute onset of symptoms around 3-4 o'clock yesterday afternoon.  Persistent since then.  Sensation that his heart was racing and a pulsing sensation in his neck.  Symptoms persisted throughout the night into the morning prompting ER evaluation.  Denies any pain.  Does feel mildly generally weak.  He has a past history of atrial flutter.  From his description, it sounds like he converted pharmacologically several years ago.  He states that he previously saw Dr. Debara Pickett.  No known recurrences since that time.  Past Medical History:  Diagnosis Date   Allergic angioedema    Allergy    Atrial flutter ([place])    Hypertension    Pneumonia    RAD (reactive airway disease)    Tick bite     Patient Active Problem List   Diagnosis Date Noted   Controlled type 2 diabetes mellitus without complication, without long-term current use of insulin ([place]) 04/02/2017   Left hip pain 02/25/2016   CMV infection, acute ([place]) 09/11/2015   Low HDL (under 40) 07/12/2013   Angioedema 11/08/2012   Paroxysmal atrial flutter ([place]) 02/04/2012   Essential hypertension 06/15/2009   BRONCHOSPASM 06/14/2009    Past Surgical History:  Procedure Laterality Date   APPENDECTOMY     CHOLECYSTECTOMY     COLONOSCOPY     stress myoview  12/13/2008   EF 61% LV  normal   TONSILLECTOMY          Home Medications    Prior to Admission medications   Medication Sig Start Date End Date Taking? Authorizing Provider  ALPRAZolam (XANAX) 0.25 MG tablet Take 1 tablet (0.25 mg total) by mouth 2 (two) times daily as needed for anxiety. Patient taking differently: Take 0.25 mg by  mouth daily as needed for anxiety.  02/15/19   Saguier, Percell Miller, PA-C  cetirizine (ZYRTEC) 10 MG tablet Take 10 mg by mouth daily.    [provider]  chlorthalidone (HYGROTON) 25 MG tablet TAKE 1 TABLET BY MOUTH EVERY DAY 04/26/19   Copland, Gay Filler, MD  diltiazem (CARDIZEM CD) 300 MG 24 hr capsule TAKE ONE CAPSULE BY MOUTH EVERY DAY 03/29/19   Copland, Gay Filler, MD  HYDROcodone-acetaminophen (NORCO) 5-325 MG tablet Take 1 tablet by mouth every 6 (six) hours as needed for moderate pain. 06/08/19   Hudnall, Sharyn Lull, MD  Insulin Pen Needle (PEN NEEDLES) 31G X 5 MM MISC 1 each by Does not apply route daily. 02/17/19   Copland, Gay Filler, MD  metFORMIN (GLUCOPHAGE-XR) 500 MG 24 hr tablet Take 2 tablets (1,000 mg total) by mouth 2 (two) times daily. 04/29/19   Copland, Gay Filler, MD  pravastatin (PRAVACHOL) 20 MG tablet TAKE 1 TABLET BY MOUTH EVERY DAY 03/29/19   Copland, Gay Filler, MD    Family History Family History  Problem Relation Age of Onset   Pancreatic cancer Mother    Hypertension Mother    Cancer Mother    Hypertension Sister    Asthma Son    Stroke Father    Hypertension Father    Hypertension Brother    Hypertension Daughter    Cancer Maternal  Grandfather    Colon polyps Neg Hx    Esophageal cancer Neg Hx    Rectal cancer Neg Hx    Stomach cancer Neg Hx     Social History Social History   Tobacco Use   Smoking status: Former Smoker    Types: Cigarettes, Pipe    Quit date: 10/05/1996    Years since quitting: 22.9   Smokeless tobacco: Former Systems developer    Types: Chew   Tobacco comment: Occasionally  Substance Use Topics   Alcohol use: No    Alcohol/week: 0.0 standard drinks   Drug use: No     Allergies   Ibuprofen, Lisinopril, Oxaprozin, Ziac [bisoprolol-hydrochlorothiazide], Bystolic [nebivolol hcl], Hctz [hydrochlorothiazide], and Norvasc [amlodipine besylate]   Review of Systems Review of Systems  All systems reviewed and negative, other  than as noted in HPI.  Physical Exam Updated Vital Signs BP 116/84 (BP Location: Left Arm)    Pulse (!) 155    Temp 98.9 F (37.2 C) (Oral)    Resp 16    Ht 6\' 2"  (1.88 m)    Wt 111.1 kg    SpO2 98%    BMI 31.46 kg/m   Physical Exam Vitals signs and nursing note reviewed.  Constitutional:      General: He is not in acute distress.    Appearance: He is well-developed.  HENT:     Head: Normocephalic and atraumatic.  Eyes:     General:        Right eye: No discharge.        Left eye: No discharge.     Conjunctiva/sclera: Conjunctivae normal.  Neck:     Musculoskeletal: Neck supple.  Cardiovascular:     Rate and Rhythm: Tachycardia present. Rhythm irregular.     Heart sounds: Normal heart sounds. No murmur. No friction rub. No gallop.   Pulmonary:     Effort: Pulmonary effort is normal. No respiratory distress.     Breath sounds: Normal breath sounds.  Abdominal:     General: There is no distension.     Palpations: Abdomen is soft.     Tenderness: There is no abdominal tenderness.  Musculoskeletal:        General: No tenderness.  Skin:    General: Skin is warm and dry.  Neurological:     Mental Status: He is alert.  Psychiatric:        Behavior: Behavior normal.        Thought Content: Thought content normal.      ED Treatments / Results  Labs (all labs ordered are listed, but only abnormal results are displayed) Labs Reviewed  BASIC METABOLIC PANEL - Abnormal; Notable for the following components:      Result Value   Chloride 97 (*)    Glucose, Bld 224 (*)    Creatinine, Ser 1.26 (*)    GFR calc non Af Amer 57 (*)    All other components within normal limits  CBC - Abnormal; Notable for the following components:   MCHC 36.6 (*)    All other components within normal limits  MAGNESIUM  TROPONIN I (HIGH SENSITIVITY)  TROPONIN I (HIGH SENSITIVITY)    EKG EKG Interpretation  Date/Time:  Thursday September 22 2019 11:28:37 EDT Ventricular Rate:  78 PR  Interval:    QRS Duration: 90 QT Interval:  363 QTC Calculation: 414 R Axis:   -40 Text Interpretation:  Sinus rhythm Prolonged PR interval Left axis deviation Abnormal R-wave progression, late transition Confirmed  by Virgel Manifold [phone]) on 09/22/2019 11:53:35 AM   Radiology No results found.  Procedures .Sedation  Date/Time: 09/22/2019 12:01 PM Performed by: Virgel Manifold, MD Authorized by: Virgel Manifold, MD   Consent:    Consent obtained:  Verbal   Consent given by:  Patient   Risks discussed:  Allergic reaction, dysrhythmia, inadequate sedation, nausea, prolonged hypoxia resulting in organ damage, prolonged sedation necessitating reversal, respiratory compromise necessitating ventilatory assistance and intubation and vomiting   Alternatives discussed:  Analgesia without sedation, anxiolysis and regional anesthesia Universal protocol:    Procedure explained and questions answered to patient or proxy's satisfaction: yes     Relevant documents present and verified: yes     Test results available and properly labeled: yes     Imaging studies available: yes     Required blood products, implants, devices, and special equipment available: yes     Site/side marked: yes     Immediately prior to procedure a time out was called: yes     Patient identity confirmation method:  Verbally with patient Indications:    Procedure necessitating sedation performed by:  Physician performing sedation Pre-sedation assessment:    Time since last food or drink:  .   ASA classification: class 2 - patient with mild systemic disease     Neck mobility: normal     Mouth opening:  3 or more finger widths   Thyromental distance:  4 finger widths   Mallampati score:  I - soft palate, uvula, fauces, pillars visible   Pre-sedation assessments completed and reviewed: airway patency, cardiovascular function, hydration status, mental status, nausea/vomiting, pain level, respiratory function and temperature     Immediate pre-procedure details:    Reassessment: Patient reassessed immediately prior to procedure     Reviewed: vital signs, relevant labs/tests and NPO status     Verified: bag valve mask available, emergency equipment available, intubation equipment available, IV patency confirmed, oxygen available and suction available   Procedure details (see MAR for exact dosages):    Preoxygenation:  Nasal cannula   Sedation:  Propofol   Intended level of sedation: deep   Intra-procedure monitoring:  Blood pressure monitoring, cardiac monitor, continuous pulse oximetry, frequent LOC assessments, frequent vital sign checks and continuous capnometry   Intra-procedure events: none     Total Provider sedation time (minutes):  30 Post-procedure details:    Attendance: Constant attendance by certified staff until patient recovered     Recovery: Patient returned to pre-procedure baseline     Post-sedation assessments completed and reviewed: airway patency, cardiovascular function, hydration status, mental status, nausea/vomiting, pain level, respiratory function and temperature     Patient is stable for discharge or admission: yes     Patient tolerance:  Tolerated well, no immediate complications .Cardioversion  Date/Time: 09/22/2019 11:30 AM Performed by: Virgel Manifold, MD Authorized by: Virgel Manifold, MD   Consent:    Consent obtained:  Verbal and written   Consent given by:  Patient   Risks discussed:  Cutaneous burn, induced arrhythmia and pain   Alternatives discussed:  Rate-control medication, alternative treatment, anti-coagulation medication and delayed treatment Pre-procedure details:    Cardioversion basis:  Elective   Rhythm:  Atrial flutter   Electrode placement:  Anterior-posterior Patient sedated: Yes. Refer to sedation procedure documentation for details of sedation.  Attempt one:    Cardioversion mode:  Synchronous   Waveform:  Biphasic   Shock (Joules):  120   Shock outcome:   Conversion to normal sinus rhythm Post-procedure  details:    Patient status:  Awake   Patient tolerance of procedure:  Tolerated well, no immediate complications    CRITICAL CARE Performed by: Virgel Manifold Total critical care time: 35 minutes Critical care time was exclusive of separately billable procedures and treating other patients. Critical care was necessary to treat or prevent imminent or life-threatening deterioration. Critical care was time spent personally by me on the following activities: development of treatment plan with patient and/or surrogate as well as nursing, discussions with consultants, evaluation of patient's response to treatment, examination of patient, obtaining history from patient or surrogate, ordering and performing treatments and interventions, ordering and review of laboratory studies, ordering and review of radiographic studies, pulse oximetry and re-evaluation of patient's condition.  (including critical care time)    Medications Ordered in ED Medications  sodium chloride flush (NS) 0.9 % injection 3 mL (has no administration in time range)     Initial Impression / Assessment and Plan / ED Course  I have reviewed the triage vital signs and the nursing notes.  Pertinent labs & imaging results that were available during my care of the patient were reviewed by me and considered in my medical decision making (see chart for details).    73 year old male with A. fib/flutter with RVR.  Patient provides a clear onset of symptoms less than 24 hours ago.  He was cardioverted to SR. Feels better. No new complaints. CHADSVASC 2 (age, HTN). Eliquis prescribed FU with Dr Debara Pickett or in [place] clinic.   Final Clinical Impressions(s) / ED Diagnoses   Final diagnoses:  Atrial flutter, unspecified type [facility])    ED Discharge Orders    None       Virgel Manifold, MD 09/22/19 1203

## 2019-09-22 NOTE — ED Notes (Signed)
Pt cardioverted at 120j

## 2019-09-22 NOTE — ED Notes (Signed)
Time out performed

## 2019-09-23 ENCOUNTER — Telehealth: Payer: Self-pay | Admitting: Physician Assistant

## 2019-09-23 NOTE — Telephone Encounter (Signed)
LVM for patient to call and schedule a hospital followup with Fabian Sharp on 10-13-19.

## 2019-09-23 NOTE — Telephone Encounter (Signed)
Message sent to schedulers to arrange f/up Last seen in office 02/2016

## 2019-09-26 NOTE — Telephone Encounter (Signed)
Patient scheduled 10/13/19 with A. Duke PA

## 2019-10-06 ENCOUNTER — Encounter: Payer: Self-pay | Admitting: Family Medicine

## 2019-10-11 NOTE — Progress Notes (Signed)
Cardiology Office Note:    Date:  10/13/2019   ID:  [name], DOB 09/26/1946, MRN [id number]  PCP:  Darreld Mclean, MD  Cardiologist:  Pixie Casino, MD   Referring MD: Darreld Mclean, MD   Chief Complaint  Patient presents with  . Hospitalization Follow-up    atrial flutter    History of Present Illness:    [name] is a 73 y.o. male with a hx of paroxysmal atrial flutter, hypertension, and past tobacco use. He was first diagnosed with atrial flutter 02/06/12. He was hospitalized and TEE-guided cardioversion was planned. When he presented to endo, he was in sinus rhythm. No anticoagulation at that time for a CHADSVSAC of 1. He last saw Dr. Debara Pickett in clinic in 2017 and was doing well on cardizem 300 mg and lopressor 100 mg daily, no AC.     He presented to [facility] 09/22/19 with less than 24 hr duration of Afib/blutter. He is very aware of his abnormal rhythm. He was cardioverted at the bedside to sinus rhythm. He was discharged on eliquis 5 mg BID post cardioversion and for a CHADSVASC of 2 (age, HTN).   He presents today for follow up. He is doing very well without recurrence of atrial flutter. He denies bleeding problems. He denies symptoms of hypo- or hyper-thyroidism. He denies SOB, orthopnea, and lower extremity swelling. He has twinges of chest discomfort that sound consistent with post-DCCV. He is active and denies exertional chest pain concerning for angina. He is taking cardizem 300 mg daily, no BB.   Past Medical History:  Diagnosis Date  . Allergic angioedema   . Allergy   . Atrial flutter ([place])   . Hypertension   . Pneumonia   . RAD (reactive airway disease)   . Tick bite     Past Surgical History:  Procedure Laterality Date  . APPENDECTOMY    . CHOLECYSTECTOMY    . COLONOSCOPY    . stress myoview  12/13/2008   EF 61% LV  normal  . TONSILLECTOMY      Current Medications: Current Meds  Medication Sig  . apixaban (ELIQUIS) 5 MG TABS  tablet Take 1 tablet (5 mg total) by mouth 2 (two) times daily.  . cetirizine (ZYRTEC) 10 MG tablet Take 10 mg by mouth daily.  . chlorthalidone (HYGROTON) 25 MG tablet TAKE 1 TABLET BY MOUTH EVERY DAY  . diltiazem (CARDIZEM CD) 300 MG 24 hr capsule TAKE ONE CAPSULE BY MOUTH EVERY DAY  . HYDROcodone-acetaminophen (NORCO) 5-325 MG tablet Take 1 tablet by mouth every 6 (six) hours as needed for moderate pain.  . Insulin Pen Needle (PEN NEEDLES) 31G X 5 MM MISC 1 each by Does not apply route daily.  . metFORMIN (GLUCOPHAGE-XR) 500 MG 24 hr tablet Take 2 tablets (1,000 mg total) by mouth 2 (two) times daily.  . pravastatin (PRAVACHOL) 20 MG tablet TAKE 1 TABLET BY MOUTH EVERY DAY     Allergies:   Ibuprofen, Lisinopril, Oxaprozin, Ziac [bisoprolol-hydrochlorothiazide], Bystolic [nebivolol hcl], Hctz [hydrochlorothiazide], and Norvasc [amlodipine besylate]   Social History   Socioeconomic History  . Marital status: Married    Spouse name: Not on file  . Number of children: Not on file  . Years of education: Not on file  . Highest education level: Not on file  Occupational History  . Not on file  Social Needs  . Financial resource strain: Not on file  . Food insecurity    Worry: Not  on file    Inability: Not on file  . Transportation needs    Medical: Not on file    Non-medical: Not on file  Tobacco Use  . Smoking status: Former Smoker    Types: Cigarettes, Pipe    Quit date: 10/05/1996    Years since quitting: 23.0  . Smokeless tobacco: Former Systems developer    Types: Chew  . Tobacco comment: Occasionally  Substance and Sexual Activity  . Alcohol use: No    Alcohol/week: 0.0 standard drinks  . Drug use: No  . Sexual activity: Yes    Birth control/protection: None  Lifestyle  . Physical activity    Days per week: Not on file    Minutes per session: Not on file  . Stress: Not on file  Relationships  . Social Herbalist on phone: Not on file    Gets together: Not on file     Attends religious service: Not on file    Active member of club or organization: Not on file    Attends meetings of clubs or organizations: Not on file    Relationship status: Not on file  Other Topics Concern  . Not on file  Social History Narrative  . Not on file     Family History: The patient's family history includes Asthma in his son; Cancer in his maternal grandfather and mother; Hypertension in his brother, daughter, father, mother, and sister; Pancreatic cancer in his mother; Stroke in his father. There is no history of Colon polyps, Esophageal cancer, Rectal cancer, or Stomach cancer.  ROS:   Please see the history of present illness.     All other systems reviewed and are negative.  EKGs/Labs/Other Studies Reviewed:    The following studies were reviewed today:  None   EKG:  EKG is  ordered today.  The ekg ordered today demonstrates sinus rhythm, first degree heart block and left axis deviation, HR 69  Recent Labs: 02/15/2019: ALT 34 09/22/2019: BUN 21; Creatinine, Ser 1.26; Hemoglobin 16.7; Magnesium 1.7; Platelets 329; Potassium 3.8; Sodium 135  Recent Lipid Panel    Component Value Date/Time   CHOL 146 05/23/2019 1004   TRIG 98.0 05/23/2019 1004   HDL 34.30 (L) 05/23/2019 1004   CHOLHDL 4 05/23/2019 1004   VLDL 19.6 05/23/2019 1004   LDLCALC 92 05/23/2019 1004    Physical Exam:    VS:  BP 131/71   Pulse 75   Temp (!) 96.3 F (35.7 C)   Ht 6\' 2"  (1.88 m)   Wt 253 lb (114.8 kg)   SpO2 94%   BMI 32.48 kg/m     Wt Readings from Last 3 Encounters:  10/13/19 253 lb (114.8 kg)  09/22/19 245 lb (111.1 kg)  06/08/19 240 lb (108.9 kg)     GEN: Well nourished, well developed in no acute distress HEENT: Normal NECK: No JVD; No carotid bruits LYMPHATICS: No lymphadenopathy CARDIAC: RRR, no murmurs, rubs, gallops RESPIRATORY:  Clear to auscultation without rales, wheezing or rhonchi  ABDOMEN: Soft, non-tender, non-distended MUSCULOSKELETAL:  No edema; No  deformity  SKIN: Warm and dry NEUROLOGIC:  Alert and oriented x 3 PSYCHIATRIC:  Normal affect   ASSESSMENT:    1. Paroxysmal atrial flutter ([place])   2. Essential hypertension   3. Controlled type 2 diabetes mellitus without complication, without long-term current use of insulin ([place])   4. Chronic anticoagulation    PLAN:    In order of problems listed above:  Paroxysmal  atrial flutter/fibrillation - EKG evidence of both fib and flutter with RVR in the ER - EKG today with sinus rhythm, first degree heart block and left axis deviation, HR 69 - Mg in ER was 1.7 - discussed OTC magnesium 200 mg (may not tolerate 2/ diarrhea in the setting of metformin)  - TSH was not drawn - was normal in May 2020 - he denies symptoms of abnormal TSH - when he gets labs with his PCP, he will request a TSH at that time for completeness - will hold off on echo at this time - if he has another occurrence, will obtain labs and echocardiogram - continue Cardizem 300 mg daily   Chronic anticoagulation This patients CHA2DS2-VASc Score and unadjusted Ischemic Stroke Rate (% per year) is equal to 2.2 % stroke rate/year from a score of 2 (age, HTN) - will need to continue eliquis for at least 4 weeks from cardioversion, and likely indefinitely given his CHADSVASC score - no bleeding problems   Hypertension Pressures are well-controlled. Continue hygroton and cardizem.   DM - continue metformin - last A1c is 6.9%   Follow up with Dr. Debara Pickett in 6 months.   Medication Adjustments/Labs and Tests Ordered: Current medicines are reviewed at length with the patient today.  Concerns regarding medicines are outlined above.  Orders Placed This Encounter  Procedures  . EKG 12-Lead   No orders of the defined types were placed in this encounter.   Signed, Ledora Bottcher, PA  10/13/2019 9:12 AM    [facility]

## 2019-10-13 ENCOUNTER — Other Ambulatory Visit: Payer: Self-pay

## 2019-10-13 ENCOUNTER — Ambulatory Visit: Payer: Medicare HMO | Admitting: Physician Assistant

## 2019-10-13 ENCOUNTER — Encounter: Payer: Self-pay | Admitting: Physician Assistant

## 2019-10-13 VITALS — BP 131/71 | HR 75 | Temp 96.3°F | Ht 74.0 in | Wt 253.0 lb

## 2019-10-13 DIAGNOSIS — Z7901 Long term (current) use of anticoagulants: Secondary | ICD-10-CM

## 2019-10-13 DIAGNOSIS — I10 Essential (primary) hypertension: Secondary | ICD-10-CM | POA: Diagnosis not present

## 2019-10-13 DIAGNOSIS — E119 Type 2 diabetes mellitus without complications: Secondary | ICD-10-CM

## 2019-10-13 DIAGNOSIS — I4892 Unspecified atrial flutter: Secondary | ICD-10-CM | POA: Diagnosis not present

## 2019-10-13 NOTE — Patient Instructions (Signed)
Medication Instructions:  Your physician recommends that you continue on your current medications as directed. Please refer to the Current Medication list given to you today.  *If you need a refill on your cardiac medications before your next appointment, please call your pharmacy*   Follow-Up: At [facility], you and your health needs are our priority.  As part of our continuing mission to provide you with exceptional heart care, we have created designated Provider Care Teams.  These Care Teams include your primary Cardiologist (physician) and Advanced Practice Providers (APPs -  Physician Assistants and Nurse Practitioners) who all work together to provide you with the care you need, when you need it.  Your next appointment:   6 months  The format for your next appointment:   In Person  Provider:   You may see Pixie Casino, MD or one of the following Advanced Practice Providers on your designated Care Team:    Almyra Deforest, PA-C  Fabian Sharp, PA-C or   Roby Lofts, [place]

## 2019-10-23 ENCOUNTER — Other Ambulatory Visit: Payer: Self-pay | Admitting: Family Medicine

## 2019-10-23 DIAGNOSIS — I10 Essential (primary) hypertension: Secondary | ICD-10-CM

## 2019-11-01 DIAGNOSIS — R69 Illness, unspecified: Secondary | ICD-10-CM | POA: Diagnosis not present

## 2019-11-02 DIAGNOSIS — Z1159 Encounter for screening for other viral diseases: Secondary | ICD-10-CM | POA: Diagnosis not present

## 2019-11-14 ENCOUNTER — Encounter: Payer: Self-pay | Admitting: Family Medicine

## 2019-11-14 ENCOUNTER — Ambulatory Visit (INDEPENDENT_AMBULATORY_CARE_PROVIDER_SITE_OTHER): Payer: Medicare HMO | Admitting: Family Medicine

## 2019-11-14 ENCOUNTER — Other Ambulatory Visit: Payer: Self-pay

## 2019-11-14 ENCOUNTER — Other Ambulatory Visit: Payer: Self-pay | Admitting: Family Medicine

## 2019-11-14 ENCOUNTER — Ambulatory Visit (HOSPITAL_BASED_OUTPATIENT_CLINIC_OR_DEPARTMENT_OTHER)
Admission: RE | Admit: 2019-11-14 | Discharge: 2019-11-14 | Disposition: A | Discharge status: Home / Self Care | Payer: Medicare HMO | Source: Ambulatory Visit | Attending: Family Medicine | Admitting: Family Medicine

## 2019-11-14 VITALS — BP 136/64 | HR 75 | Temp 97.2°F | Resp 18 | Ht 74.0 in | Wt 254.4 lb

## 2019-11-14 DIAGNOSIS — I10 Essential (primary) hypertension: Secondary | ICD-10-CM

## 2019-11-14 DIAGNOSIS — M549 Dorsalgia, unspecified: Secondary | ICD-10-CM

## 2019-11-14 DIAGNOSIS — E119 Type 2 diabetes mellitus without complications: Secondary | ICD-10-CM | POA: Diagnosis not present

## 2019-11-14 DIAGNOSIS — R0781 Pleurodynia: Secondary | ICD-10-CM | POA: Diagnosis not present

## 2019-11-14 LAB — COMPREHENSIVE METABOLIC PANEL
ALT: 24 U/L (ref 0–53)
AST: 21 U/L (ref 0–37)
Albumin: 3.9 g/dL (ref 3.5–5.2)
Alkaline Phosphatase: 60 U/L (ref 39–117)
BUN: 20 mg/dL (ref 6–23)
CO2: 31 mEq/L (ref 19–32)
Calcium: 9.6 mg/dL (ref 8.4–10.5)
Chloride: 99 mEq/L (ref 96–112)
Creatinine, Ser: 1.14 mg/dL (ref 0.40–1.50)
GFR: 63.32 mL/min (ref >60.00)
Glucose, Bld: 146 mg/dL — ABNORMAL HIGH (ref 70–99)
Potassium: 4.4 mEq/L (ref 3.5–5.1)
Sodium: 138 mEq/L (ref 135–145)
Total Bilirubin: 0.4 mg/dL (ref 0.2–1.2)
Total Protein: 7.1 g/dL (ref 6.0–8.3)

## 2019-11-14 LAB — HEMOGLOBIN A1C: Hgb A1c MFr Bld: 7.2 % — ABNORMAL HIGH (ref 4.6–6.5)

## 2019-11-14 MED ORDER — METHOCARBAMOL 500 MG PO TABS: 500.0000 mg | ORAL_TABLET | Freq: Three times a day (TID) | ORAL | 0 refills | Status: DC | PRN

## 2019-11-14 NOTE — Progress Notes (Addendum)
[place] at [facility] [address] [place], [place] [address] [phone]  Date:  11/14/2019   Name:  [name]   DOB:  February 08, 1946   MRN:  [id number]  PCP:  Darreld Mclean, MD    Chief Complaint: Back Pain (Lower left back pain x2 months. Pt has tried ice, NSAIDs, stretching)   History of Present Illness:  [name] is a 73 y.o. 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 Controlled type 2 diabetes mellitus without complication, without long-term current use of insulin ([place]) 04/02/2017  . Left hip pain 02/25/2016  . CMV infection, acute ([place]) 09/11/2015  . Low HDL (under 40) 07/12/2013  . Angioedema 11/08/2012  . Paroxysmal atrial flutter ([place]) 02/04/2012  . Essential hypertension 06/15/2009  . BRONCHOSPASM 06/14/2009    Past Medical History:  Diagnosis Date  . Allergic angioedema   . Allergy   . Atrial flutter ([place])   . Hypertension   . Pneumonia   . RAD (reactive airway disease)   . Tick bite     Past Surgical History:   Procedure Laterality Date  . APPENDECTOMY    . CHOLECYSTECTOMY    . COLONOSCOPY    . stress myoview  12/13/2008   EF 61% LV  normal  . TONSILLECTOMY      Social History   Tobacco Use  . Smoking status: Former Smoker    Types: Cigarettes, Pipe    Quit date: 10/05/1996    Years since quitting: 23.1  . Smokeless tobacco: Former Systems developer    Types: Chew  . Tobacco comment: Occasionally  Substance Use Topics  . Alcohol use: No    Alcohol/week: 0.0 standard drinks  . Drug use: No    Family History  Problem Relation Age of Onset  . Pancreatic cancer Mother   . Hypertension Mother   . Cancer Mother   . Hypertension Sister   . Asthma Son   . Stroke Father   . Hypertension Father   . Hypertension Brother   . Hypertension Daughter   . Cancer Maternal Grandfather   . Colon polyps Neg Hx   . Esophageal cancer Neg Hx   . Rectal cancer Neg Hx   . Stomach cancer Neg Hx     Allergies  Allergen Reactions  . Ibuprofen Swelling  . Lisinopril Swelling    Swelling of face and lips   . Oxaprozin Swelling  Swelling of face and lips  . Ziac [Bisoprolol-Hydrochlorothiazide] Swelling    Per patient records from Dr. Florina Ou.  [facility] Jew [facility]cl] Itching  . Hctz [Hydrochlorothiazide]   . Norvasc [Amlodipine Besylate]     Medication list has been reviewed and updated.  Current Outpatient Medications on File Prior to Visit  Medication Sig Dispense Refill  . cetirizine (ZYRTEC) 10 MG tablet Take 10 mg by mouth daily.    . chlorthalidone (HYGROTON) 25 MG tablet TAKE 1 TABLET BY MOUTH EVERY DAY 90 tablet 1  . diltiazem (CARDIZEM CD) 300 MG 24 hr capsule TAKE ONE CAPSULE BY MOUTH EVERY DAY 90 capsule 3  . metFORMIN (GLUCOPHAGE-XR) 500 MG 24 hr tablet Take 2 tablets (1,000 mg total) by mouth 2 (two) times daily. 360 tablet 1  . pravastatin (PRAVACHOL) 20 MG tablet TAKE 1 TABLET BY MOUTH EVERY DAY 90 tablet 3  . apixaban (ELIQUIS) 5 MG TABS tablet Take 1 tablet (5 mg total) by  mouth 2 (two) times daily. 60 tablet 0   No current facility-administered medications on file prior to visit.     Review of Systems:  As per HPI- otherwise negative.  No fever or chills, no chest pain or shortness of breath Physical Examination: Vitals:   11/14/19 1125  BP: 136/64  Pulse: 75  Resp: 18  Temp: (!) 97.2 F (36.2 C)  SpO2: 96%   Vitals:   11/14/19 1125  Weight: 254 lb 6 oz (115.4 kg)  Height: 6\' 2"  (1.88 m)   Body mass index is 32.66 kg/m. Ideal Body Weight: Weight in (lb) to have BMI = 25: 194.3  GEN: WDWN, NAD, Non-toxic, A & O x 3, tall build, overweight HEENT: Atraumatic, Normocephalic. Neck supple. No masses, No LAD. Ears and Nose: No external deformity. CV: RRR, No M/G/R. No JVD. No thrill. No extra heart sounds. PULM: CTA B, no wheezes, crackles, rhonchi. No retractions. No resp. distress. No accessory muscle use. ABD: S, NT, ND, +BS. No rebound. No HSM. EXTR: No c/c/e NEURO Normal gait.  PSYCH: Normally interactive. Conversant. Not depressed or anxious appearing.  Calm demeanor.  He notes mild tenderness palpation over the left inferior posterior ribs.  I only somewhat able to reproduce his discomfort with palpating the area Normal lumbar flexion extension, normal bilateral lower extremity DTR, strength, sensation, negative straight leg raise bilaterally   Assessment and Plan: Essential hypertension  Controlled type 2 diabetes mellitus without complication, without long-term current use of insulin ([facility]) - Plan: Comprehensive metabolic panel, Hemoglobin A1c  Mid back pain on right side - Plan: methocarbamol (ROBAXIN) 500 MG tablet, CANCELED: DG Ribs Unilateral W/Chest Right, CANCELED: DG Thoracic Spine 2 View  Here today with concern of pain in the left lower posterior back, over the ribs.  Due to somewhat unusual location, will obtain plain films today Plan to try Robaxin as needed for pain Acupuncture, physical therapy may also be helpful We  will also obtain routine labs today  This visit occurred during the SARS-CoV-2 public health emergency.  Safety protocols were in place, including screening questions prior to the visit, additional usage of staff PPE, and extensive cleaning of exam room while observing appropriate contact time as indicated for disinfecting solutions.    Signed Lamar Blinks, MD  Received his films, message to patient   Dg Ribs Unilateral W/chest Left  Result Date: 11/14/2019 CLINICAL DATA:  Left-sided rib pain for 1 month.  No injury. EXAM: LEFT RIBS AND CHEST - 3+ VIEW  COMPARISON:  Chest x-ray 09/22/2019. FINDINGS: Mediastinum and hilar structures normal. Stable solitary nodular opacity noted projected over the lung bases consistent with nipple shadows. Lungs are clear of acute infiltrates. No pleural effusion or pneumothorax. Heart size normal. Stool noted throughout the colon. No bowel distention. No free air. No acute bony abnormality identified. No evidence of displaced fracture. IMPRESSION: No acute bony abnormality identified. No evidence of displaced fracture or pneumothorax. Electronically Signed   By: Marcello Moores  Register   On: 11/14/2019 13:07   Dg Thoracic Spine W/swimmers  Result Date: 11/14/2019 CLINICAL DATA:  Left-sided rib pain for 1 month.  No known injury. EXAM: THORACIC SPINE - 3 VIEWS COMPARISON:  None. FINDINGS: Normal alignment of the thoracic vertebral bodies. 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 No fracture or bone lesion. No paraspinal soft tissue swelling. The visualized lungs are clear. IMPRESSION: Normal alignment and no acute bony findings. 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 Electronically Signed   By: Marijo Sanes M.D.   On: 11/14/2019 13:01   Received his labs, message to pt  Results for orders placed or performed in visit on 11/14/19  Comprehensive metabolic panel  Result Value Ref Range   Sodium 138 135 - 145 mEq/L   Potassium 4.4 3.5 - 5.1 mEq/L   Chloride 99 96 - 112 mEq/L   CO2 31 19 - 32  mEq/L   Glucose, Bld 146 (H) 70 - 99 mg/dL   BUN 20 6 - 23 mg/dL   Creatinine, Ser 1.14 0.40 - 1.50 mg/dL   Total Bilirubin 0.4 0.2 - 1.2 mg/dL   Alkaline Phosphatase 60 39 - 117 U/L   AST 21 0 - 37 U/L   ALT 24 0 - 53 U/L   Total Protein 7.1 6.0 - 8.3 g/dL   Albumin 3.9 3.5 - 5.2 g/dL   GFR 63.32 >60.00 mL/min   Calcium 9.6 8.4 - 10.5 mg/dL  Hemoglobin A1c  Result Value Ref Range   Hgb A1c MFr Bld 7.2 (H) 4.6 - 6.5 %

## 2019-11-14 NOTE — Patient Instructions (Signed)
Good to see you again today!   Please go to lab and then to the ground floor for x-rays I will be in touch with your reports asap We will try robaxin- muscle relaxer- as needed for your back pain Ok to use also ibuprofen, etc OTC  Please schedule an eye exam when you are able

## 2019-11-17 ENCOUNTER — Encounter: Payer: Self-pay | Admitting: Family Medicine

## 2019-12-14 DIAGNOSIS — I788 Other diseases of capillaries: Secondary | ICD-10-CM | POA: Diagnosis not present

## 2019-12-14 DIAGNOSIS — D1801 Hemangioma of skin and subcutaneous tissue: Secondary | ICD-10-CM | POA: Diagnosis not present

## 2019-12-14 DIAGNOSIS — Z85828 Personal history of other malignant neoplasm of skin: Secondary | ICD-10-CM | POA: Diagnosis not present

## 2019-12-14 DIAGNOSIS — L570 Actinic keratosis: Secondary | ICD-10-CM | POA: Diagnosis not present

## 2019-12-14 DIAGNOSIS — D3617 Benign neoplasm of peripheral nerves and autonomic nervous system of trunk, unspecified: Secondary | ICD-10-CM | POA: Diagnosis not present

## 2019-12-14 DIAGNOSIS — D225 Melanocytic nevi of trunk: Secondary | ICD-10-CM | POA: Diagnosis not present

## 2019-12-14 DIAGNOSIS — L821 Other seborrheic keratosis: Secondary | ICD-10-CM | POA: Diagnosis not present

## 2019-12-14 DIAGNOSIS — D235 Other benign neoplasm of skin of trunk: Secondary | ICD-10-CM | POA: Diagnosis not present

## 2020-01-12 ENCOUNTER — Ambulatory Visit: Payer: Medicare HMO

## 2020-01-13 ENCOUNTER — Ambulatory Visit: Payer: Medicare HMO

## 2020-01-17 ENCOUNTER — Ambulatory Visit: Payer: Medicare HMO

## 2020-01-18 ENCOUNTER — Other Ambulatory Visit: Payer: Self-pay | Admitting: Family Medicine

## 2020-01-18 ENCOUNTER — Ambulatory Visit: Payer: Medicare HMO

## 2020-01-18 DIAGNOSIS — R739 Hyperglycemia, unspecified: Secondary | ICD-10-CM

## 2020-01-20 ENCOUNTER — Ambulatory Visit: Payer: Medicare HMO

## 2020-01-24 ENCOUNTER — Ambulatory Visit: Payer: Medicare HMO | Admitting: Cardiology

## 2020-01-24 ENCOUNTER — Telehealth: Payer: Self-pay | Admitting: Internal Medicine

## 2020-01-24 ENCOUNTER — Other Ambulatory Visit: Payer: Self-pay

## 2020-01-24 ENCOUNTER — Encounter: Payer: Self-pay | Admitting: Cardiology

## 2020-01-24 VITALS — BP 124/60 | HR 109 | Temp 96.5°F | Ht 74.0 in | Wt 255.0 lb

## 2020-01-24 DIAGNOSIS — I4892 Unspecified atrial flutter: Secondary | ICD-10-CM

## 2020-01-24 DIAGNOSIS — G4730 Sleep apnea, unspecified: Secondary | ICD-10-CM | POA: Diagnosis not present

## 2020-01-24 DIAGNOSIS — E119 Type 2 diabetes mellitus without complications: Secondary | ICD-10-CM

## 2020-01-24 DIAGNOSIS — Z87898 Personal history of other specified conditions: Secondary | ICD-10-CM

## 2020-01-24 DIAGNOSIS — I10 Essential (primary) hypertension: Secondary | ICD-10-CM

## 2020-01-24 MED ORDER — METOPROLOL SUCCINATE ER 25 MG PO TB24: 25.0000 mg | ORAL_TABLET | Freq: Every day | ORAL | 6 refills | Status: DC

## 2020-01-24 MED ORDER — APIXABAN 5 MG PO TABS: 5.0000 mg | ORAL_TABLET | Freq: Two times a day (BID) | ORAL | 6 refills | Status: DC

## 2020-01-24 NOTE — Telephone Encounter (Signed)
New Message  STAT if HR is under 50 or over 120 (normal HR is 60-100 beats per minute)  1) What is your heart rate? It was 140 around 9 pm last night, 120 around 10 pm  2) Do you have a log of your heart rate readings (document readings)? No   3) Do you have any other symptoms? No

## 2020-01-24 NOTE — Patient Instructions (Addendum)
Medication Instructions:  START Toprol 25mg  Take 1 tablet once a day RESTART Eliquis 5mg  Take 1 tablet twice a day   *If you need a refill on your cardiac medications before your next appointment, please call your pharmacy*  Lab Work: Your physician recommends that you return for lab work in: TODAY-CBC, CMET, TSH, MAG If you have labs (blood work) drawn today and your tests are completely normal, you will receive your results only by: Marland Kitchen MyChart Message (if you have MyChart) OR . A paper copy in the mail If you have any lab test that is abnormal or we need to change your treatment, we will call you to review the results.  Testing/Procedures: Your physician has requested that you have an echocardiogram. Echocardiography is a painless test that uses sound waves to create images of your heart. It provides your doctor with information about the size and shape of your heart and how well your heart's chambers and valves are working. This procedure takes approximately one hour. There are no restrictions for this procedure. SCHEDULE TEST URGENTLY  TEST WILL BE COMPLETED AT [place] [address]   Follow-Up: At [facility], you and your health needs are our priority.  As part of our continuing mission to provide you with exceptional heart care, we have created designated Provider Care Teams.  These Care Teams include your primary Cardiologist (physician) and Advanced Practice Providers (APPs -  Physician Assistants and Nurse Practitioners) who all work together to provide you with the care you need, when you need it.  Your next appointment:   FOLLOW UP WILL BE DETERMINED ONCE WE HEAR BACK FROM DR HILTY  The format for your next appointment:   In Person  Provider:   K. Mali Hilty, MD  Other Instructions

## 2020-01-24 NOTE — Telephone Encounter (Signed)
Returned call to patient.He stated he had a episode of fast heart beat last night.Started around 9:00 pm and lasted most of night.Stated he feels better at present,but would like to be seen.Appointment scheduled with Kerin Ransom PA this morning at 11:15 am.

## 2020-01-24 NOTE — Progress Notes (Signed)
Cardiology Office Note:    Date:  01/24/2020   ID:  [name], DOB [birth year], MRN [id number]  PCP:  Darreld Mclean, MD  Cardiologist:  Pixie Casino, MD  Electrophysiologist:  None   Referring MD: Darreld Mclean, MD   CC: palpitations  History of Present Illness:    [name] is a 74 y.o. male with a hx of PAF, initially diagnosed in 2013.  He was scheduled for TEE DCCV but converted spontaneously.  He was CHADS VASC-1 and was not placed on long term anticoagulation then.  He never had an echo but a stress Myoview in 2009 showed he had normal LVF.   He did well until Oct 2020 when he presented to the ED with palpitations and was found to be in AF with RVR of less than 24 hours duration.  He was cardioverted at bedside.  He was placed on Eliquis then but stopped this after his prescription ran out.    He presents now with c/o palpitations which started around 10 pm last evening.  He can tell he is in AF and at one point he says his HR was 140.  He denies any rest SOB or chest pain.  In the office today his HR is 100 in AF.    Past Medical History:  Diagnosis Date  . Allergic angioedema   . Allergy   . Atrial flutter ([place])   . Hypertension   . Pneumonia   . RAD (reactive airway disease)   . Tick bite     Past Surgical History:  Procedure Laterality Date  . APPENDECTOMY    . CHOLECYSTECTOMY    . COLONOSCOPY    . stress myoview  12/13/2008   EF 61% LV  normal  . TONSILLECTOMY      Current Medications: Current Meds  Medication Sig  . cetirizine (ZYRTEC) 10 MG tablet Take 10 mg by mouth daily.  . chlorthalidone (HYGROTON) 25 MG tablet TAKE 1 TABLET BY MOUTH EVERY DAY  . diltiazem (CARDIZEM CD) 300 MG 24 hr capsule TAKE ONE CAPSULE BY MOUTH EVERY DAY  . metFORMIN (GLUCOPHAGE-XR) 500 MG 24 hr tablet TAKE 2 TABLETS BY MOUTH TWICE A DAY  . pravastatin (PRAVACHOL) 20 MG tablet TAKE 1 TABLET BY MOUTH EVERY DAY     Allergies:   Ibuprofen, Lisinopril,  Oxaprozin, Ziac [bisoprolol-hydrochlorothiazide], Bystolic [nebivolol hcl], Hctz [hydrochlorothiazide], and Norvasc [amlodipine besylate]   Social History   Socioeconomic History  . Marital status: Married    Spouse name: Not on file  . Number of children: Not on file  . Years of education: Not on file  . Highest education level: Not on file  Occupational History  . Not on file  Tobacco Use  . Smoking status: Former Smoker    Types: Cigarettes, Pipe    Quit date: 10/05/1996    Years since quitting: 23.3  . Smokeless tobacco: Former Systems developer    Types: Chew  . Tobacco comment: Occasionally  Substance and Sexual Activity  . Alcohol use: No    Alcohol/week: 0.0 standard drinks  . Drug use: No  . Sexual activity: Yes    Birth control/protection: None  Other Topics Concern  . Not on file  Social History Narrative  . Not on file   Social Determinants of Health   Financial Resource Strain:   . Difficulty of Paying Living Expenses: Not on file  Food Insecurity:   . Worried About Charity fundraiser in the  Last Year: Not on file  . Ran Out of Food in the Last Year: Not on file  Transportation Needs:   . Lack of Transportation (Medical): Not on file  . Lack of Transportation (Non-Medical): Not on file  Physical Activity:   . Days of Exercise per Week: Not on file  . Minutes of Exercise per Session: Not on file  Stress:   . Feeling of Stress : Not on file  Social Connections:   . Frequency of Communication with Friends and Family: Not on file  . Frequency of Social Gatherings with Friends and Family: Not on file  . Attends Religious Services: Not on file  . Active Member of Clubs or Organizations: Not on file  . Attends Archivist Meetings: Not on file  . Marital Status: Not on file     Family History: The patient's family history includes Asthma in his son; Cancer in his maternal grandfather and mother; Hypertension in his brother, daughter, father, mother, and  sister; Pancreatic cancer in his mother; Stroke in his father. There is no history of Colon polyps, Esophageal cancer, Rectal cancer, or Stomach cancer.  ROS:   Please see the history of present illness.   The patient has a history of sleep apnea, I believe he was worked up at [facility] for this.  C-pap device was recommended but the patient couldn't tolerate it.   All other systems reviewed and are negative.  EKGs/Labs/Other Studies Reviewed:    The following studies were reviewed today: Myoview 2009  EKG:  EKG is ordered today.  The ekg ordered today demonstrates AF with VR-108, inferior Qs, LAD  Recent Labs: 09/22/2019: Hemoglobin 16.7; Magnesium 1.7; Platelets 329 11/14/2019: ALT 24; BUN 20; Creatinine, Ser 1.14; Potassium 4.4; Sodium 138  Recent Lipid Panel    Component Value Date/Time   CHOL 146 05/23/2019 1004   TRIG 98.0 05/23/2019 1004   HDL 34.30 (L) 05/23/2019 1004   CHOLHDL 4 05/23/2019 1004   VLDL 19.6 05/23/2019 1004   LDLCALC 92 05/23/2019 1004    Physical Exam:    VS:  BP 124/60   Pulse (!) 109   Temp (!) 96.5 F (35.8 C) (Temporal)   Ht 6\' 2"  (1.88 m)   Wt 255 lb (115.7 kg)   SpO2 98%   BMI 32.74 kg/m     Wt Readings from Last 3 Encounters:  01/24/20 255 lb (115.7 kg)  11/14/19 254 lb 6 oz (115.4 kg)  10/13/19 253 lb (114.8 kg)     GEN: Well nourished, well developed in no acute distress HEENT: Normal NECK: No JVD; No carotid bruits CARDIAC: irregularly irregular, no murmurs, rubs, gallops RESPIRATORY:  Clear to auscultation without rales, wheezing or rhonchi  ABDOMEN: Soft, non-tender, non-distended MUSCULOSKELETAL:  No edema; No deformity  SKIN: Warm and dry NEUROLOGIC:  Alert and oriented x 3 PSYCHIATRIC:  Normal affect   ASSESSMENT:    PAF- Recurrent- mildly symptomatic, rate 100  Essential HTN- B/P controlled- H/O angioedema, no ACE or ARB  CHADS VASC=3 Age, HTN, and DM- start Eliquis 5mg  BID  NIDDM- On  Glucophage  Sleep apnea- He has been intolerant to C-pap in the past   PLAN:    Start Eliquis 5 mg BID, add Toprol XL 25 mg daily.  Check labs including Mg++ and TSH, check echo for LA size and LVF.  I'll review further Rx with Dr Debara Pickett, there is no urgency since he is minimally symptomatic and will require 4 weeks of  anticoagulation before considering cardioversion.   Consider Sotalol if LVF is preserved-?   He seems too young to commit to Amiodarone- consider Multaq  (no history of CHF) ?  Another option would be Tikosyn and I think that would require an EP consult.   Medication Adjustments/Labs and Tests Ordered: Current medicines are reviewed at length with the patient today.  Concerns regarding medicines are outlined above.  Orders Placed This Encounter  Procedures  . CBC with Differential  . Comprehensive Metabolic Panel (CMET)  . Magnesium  . TSH  . EKG 12-Lead  . ECHOCARDIOGRAM COMPLETE   Meds ordered this encounter  Medications  . apixaban (ELIQUIS) 5 MG TABS tablet    Sig: Take 1 tablet (5 mg total) by mouth 2 (two) times daily.    Dispense:  60 tablet    Refill:  6  . metoprolol succinate (TOPROL XL) 25 MG 24 hr tablet    Sig: Take 1 tablet (25 mg total) by mouth daily.    Dispense:  30 tablet    Refill:  6    Patient Instructions  Medication Instructions:  START Toprol 25mg  Take 1 tablet once a day RESTART Eliquis 5mg  Take 1 tablet twice a day   *If you need a refill on your cardiac medications before your next appointment, please call your pharmacy*  Lab Work: Your physician recommends that you return for lab work in: TODAY-CBC, CMET, TSH, MAG If you have labs (blood work) drawn today and your tests are completely normal, you will receive your results only by: Marland Kitchen MyChart Message (if you have MyChart) OR . A paper copy in the mail If you have any lab test that is abnormal or we need to change your treatment, we will call you to review the  results.  Testing/Procedures: Your physician has requested that you have an echocardiogram. Echocardiography is a painless test that uses sound waves to create images of your heart. It provides your doctor with information about the size and shape of your heart and how well your heart's chambers and valves are working. This procedure takes approximately one hour. There are no restrictions for this procedure. SCHEDULE TEST URGENTLY  TEST WILL BE COMPLETED AT [place] [address]   Follow-Up: At [facility], you and your health needs are our priority.  As part of our continuing mission to provide you with exceptional heart care, we have created designated Provider Care Teams.  These Care Teams include your primary Cardiologist (physician) and Advanced Practice Providers (APPs -  Physician Assistants and Nurse Practitioners) who all work together to provide you with the care you need, when you need it.  Your next appointment:   FOLLOW UP WILL BE DETERMINED ONCE WE HEAR BACK FROM DR HILTY  The format for your next appointment:   In Person  Provider:   K. Mali Hilty, MD  Other Instructions     Signed, Kerin Ransom, PA-C  01/24/2020 12:40 PM    [place]

## 2020-01-25 LAB — COMPREHENSIVE METABOLIC PANEL
ALT: 27 IU/L (ref 0–44)
AST: 22 IU/L (ref 0–40)
Albumin/Globulin Ratio: 1.5 (ref 1.2–2.2)
Albumin: 4.4 g/dL (ref 3.7–4.7)
Alkaline Phosphatase: 73 IU/L (ref 39–117)
BUN/Creatinine Ratio: 17 (ref 10–24)
BUN: 22 mg/dL (ref 8–27)
Bilirubin Total: 0.4 mg/dL (ref 0.0–1.2)
CO2: 21 mmol/L (ref 20–29)
Calcium: 9.9 mg/dL (ref 8.6–10.2)
Chloride: 98 mmol/L (ref 96–106)
Creatinine, Ser: 1.3 mg/dL — ABNORMAL HIGH (ref 0.76–1.27)
GFR calc Af Amer: 63 mL/min/{1.73_m2} (ref >59)
GFR calc non Af Amer: 55 mL/min/{1.73_m2} — ABNORMAL LOW (ref >59)
Globulin, Total: 3 g/dL (ref 1.5–4.5)
Glucose: 160 mg/dL — ABNORMAL HIGH (ref 65–99)
Potassium: 4 mmol/L (ref 3.5–5.2)
Sodium: 138 mmol/L (ref 134–144)
Total Protein: 7.4 g/dL (ref 6.0–8.5)

## 2020-01-25 LAB — CBC WITH DIFFERENTIAL/PLATELET
Basophils Absolute: 0 10*3/uL (ref 0.0–0.2)
Basos: 0 %
EOS (ABSOLUTE): 0.2 10*3/uL (ref 0.0–0.4)
Eos: 2 %
Hematocrit: 47.6 % (ref 37.5–51.0)
Hemoglobin: 17 g/dL (ref 13.0–17.7)
Immature Grans (Abs): 0 10*3/uL (ref 0.0–0.1)
Immature Granulocytes: 0 %
Lymphocytes Absolute: 3 10*3/uL (ref 0.7–3.1)
Lymphs: 31 %
MCH: 31.3 pg (ref 26.6–33.0)
MCHC: 35.7 g/dL (ref 31.5–35.7)
MCV: 88 fL (ref 79–97)
Monocytes Absolute: 0.9 10*3/uL (ref 0.1–0.9)
Monocytes: 10 %
Neutrophils Absolute: 5.6 10*3/uL (ref 1.4–7.0)
Neutrophils: 57 %
Platelets: 366 10*3/uL (ref 150–450)
RBC: 5.43 x10E6/uL (ref 4.14–5.80)
RDW: 13.2 % (ref 11.6–15.4)
WBC: 9.8 10*3/uL (ref 3.4–10.8)

## 2020-01-25 LAB — TSH: TSH: 2.51 u[IU]/mL (ref 0.450–4.500)

## 2020-01-25 LAB — MAGNESIUM: Magnesium: 1.8 mg/dL (ref 1.6–2.3)

## 2020-01-26 ENCOUNTER — Telehealth: Payer: Self-pay | Admitting: Cardiology

## 2020-01-26 ENCOUNTER — Ambulatory Visit (HOSPITAL_COMMUNITY)
Admission: RE | Admit: 2020-01-26 | Discharge: 2020-01-26 | Disposition: A | Discharge status: Home / Self Care | Payer: Medicare HMO | Source: Ambulatory Visit | Attending: Cardiology | Admitting: Cardiology

## 2020-01-26 ENCOUNTER — Other Ambulatory Visit: Payer: Self-pay

## 2020-01-26 DIAGNOSIS — I10 Essential (primary) hypertension: Secondary | ICD-10-CM

## 2020-01-26 DIAGNOSIS — Z87898 Personal history of other specified conditions: Secondary | ICD-10-CM | POA: Insufficient documentation

## 2020-01-26 DIAGNOSIS — G4730 Sleep apnea, unspecified: Secondary | ICD-10-CM | POA: Diagnosis not present

## 2020-01-26 DIAGNOSIS — Z87891 Personal history of nicotine dependence: Secondary | ICD-10-CM | POA: Insufficient documentation

## 2020-01-26 DIAGNOSIS — E119 Type 2 diabetes mellitus without complications: Secondary | ICD-10-CM | POA: Diagnosis not present

## 2020-01-26 DIAGNOSIS — I4892 Unspecified atrial flutter: Secondary | ICD-10-CM

## 2020-01-26 NOTE — Telephone Encounter (Signed)
Left message for patient to contact the office to schedule 3 wk ov with Kerin Ransom.

## 2020-01-26 NOTE — Progress Notes (Signed)
  Echocardiogram 2D Echocardiogram has been performed.  Michiel Cowboy 01/26/2020, 8:37 AM

## 2020-01-26 NOTE — Telephone Encounter (Signed)
Discussed with Dr Debara Pickett- plan anticoagulation and rate control- followed by repeat DCCV.  I'll see him back in 3 weeks.  I discussed this with the patient.   Kerin Ransom PA-C 01/26/2020 11:11 AM

## 2020-02-15 ENCOUNTER — Encounter: Payer: Self-pay | Admitting: *Deleted

## 2020-02-15 ENCOUNTER — Encounter: Payer: Self-pay | Admitting: Cardiology

## 2020-02-15 ENCOUNTER — Telehealth (INDEPENDENT_AMBULATORY_CARE_PROVIDER_SITE_OTHER): Payer: Medicare HMO | Admitting: Cardiology

## 2020-02-15 ENCOUNTER — Telehealth: Payer: Self-pay | Admitting: Internal Medicine

## 2020-02-15 VITALS — BP 131/69 | HR 120 | Ht 74.0 in | Wt 250.0 lb

## 2020-02-15 DIAGNOSIS — E119 Type 2 diabetes mellitus without complications: Secondary | ICD-10-CM

## 2020-02-15 DIAGNOSIS — I10 Essential (primary) hypertension: Secondary | ICD-10-CM

## 2020-02-15 DIAGNOSIS — G4730 Sleep apnea, unspecified: Secondary | ICD-10-CM

## 2020-02-15 DIAGNOSIS — Z7901 Long term (current) use of anticoagulants: Secondary | ICD-10-CM | POA: Insufficient documentation

## 2020-02-15 DIAGNOSIS — I4892 Unspecified atrial flutter: Secondary | ICD-10-CM | POA: Diagnosis not present

## 2020-02-15 NOTE — Progress Notes (Signed)
Virtual Visit via Telephone Note   This visit type was conducted due to national recommendations for restrictions regarding the COVID-19 Pandemic (e.g. social distancing) in an effort to limit this patient's exposure and mitigate transmission in our community.  Due to his co-morbid illnesses, this patient is at least at moderate risk for complications without adequate follow up.  This format is felt to be most appropriate for this patient at this time.  The patient did not have access to video technology/had technical difficulties with video requiring transitioning to audio format only (telephone).  All issues noted in this document were discussed and addressed.  No physical exam could be performed with this format.  Please refer to the patient's chart for his  consent to telehealth for [facility].   Date:  02/15/2020   ID:  [name], DOB Jul 15, 1946, MRN [id number]  Patient Location: Home Provider Location: Home  PCP:  [name], Jared Filler, MD  Cardiologist:  Pixie Casino, MD  Electrophysiologist:  None   Evaluation Performed:  Follow-Up Visit  Chief Complaint:  none  History of Present Illness:    [name] is a pleasant 74 y.o. male with a hx of PAF, initially diagnosed in 2013.  He was scheduled for TEE DCCV then but converted spontaneously.  He was a CHADS VASC-1 and was not placed on long term anticoagulation then.  He never had an echo but a stress Myoview in 2009 showed he had normal LVF.   He did well until Oct 2020 when he presented to the ED with palpitations and was found to be in AF with RVR of less than 24 hours duration.  He was cardioverted at bedside.  He was placed on Eliquis then but stopped this after his prescription ran out.    I saw him in the office 01/24/2020 with c/o palpitations which started around 10 pm the prior evening.  He told me could tell he was in AF and at one point he says his HR was 140.  He denied any rest SOB or chest pain.  In  the office that day his HR was 100 in AF.    I added Toprol 25 mg daily (he was already on Diltiazem 300 mg) and Eliquis 5 mg BID.  After discussion with Dr Debara Pickett the plan was to see him in f/u in 3-4 weeks and plan on OP DCCV if he had remained in AF.  The patient was contacted today for follow up.  This was actually supposed to be an OV but it was changed to a virtual visit.  Today his heart rate is 117.  He said last week he had some days where he did not feel good and he took his heart rate and it was up around 120.  He is otherwise unaware of palpitations or tachycardia.  He was surprised today when his heart rate was 117 because he says he feels "pretty good".  It seems clearhe is still in atrial fibrillation. He tells me he has not missed any Eliquis doses  I will increase his Toprol to 50 mg daily and go ahead and arrange for outpatient DC cardioversion next week. The patient understands the risks and benefits of the procedure and is willing to proceed.   The patient does not have symptoms concerning for COVID-19 infection (fever, chills, cough, or new shortness of breath).    Past Medical History:  Diagnosis Date  . Allergic angioedema   . Allergy   .  Hypertension   . Non-insulin dependent type 2 diabetes mellitus ([place])   . PAF (paroxysmal atrial fibrillation) ([place])   . Pneumonia   . RAD (reactive airway disease)   . Sleep apnea    C-pap intolerant  . Tick bite    Past Surgical History:  Procedure Laterality Date  . APPENDECTOMY    . CHOLECYSTECTOMY    . COLONOSCOPY    . stress myoview  12/13/2008   EF 61% LV  normal  . TONSILLECTOMY       Current Meds  Medication Sig  . apixaban (ELIQUIS) 5 MG TABS tablet Take 1 tablet (5 mg total) by mouth 2 (two) times daily.  . cetirizine (ZYRTEC) 10 MG tablet Take 10 mg by mouth daily.  . chlorthalidone (HYGROTON) 25 MG tablet TAKE 1 TABLET BY MOUTH EVERY DAY  . diltiazem (CARDIZEM CD) 300 MG 24 hr capsule TAKE ONE CAPSULE BY MOUTH  EVERY DAY  . metFORMIN (GLUCOPHAGE-XR) 500 MG 24 hr tablet TAKE 2 TABLETS BY MOUTH TWICE A DAY  . metoprolol succinate (TOPROL XL) 25 MG 24 hr tablet Take 1 tablet (25 mg total) by mouth daily.  . pravastatin (PRAVACHOL) 20 MG tablet TAKE 1 TABLET BY MOUTH EVERY DAY     Allergies:   Ibuprofen, Lisinopril, Oxaprozin, Ziac [bisoprolol-hydrochlorothiazide], Bystolic [nebivolol hcl], Hctz [hydrochlorothiazide], and Norvasc [amlodipine besylate]   Social History   Tobacco Use  . Smoking status: Former Smoker    Types: Cigarettes, Pipe    Quit date: 10/05/1996    Years since quitting: 23.3  . Smokeless tobacco: Former Systems developer    Types: Chew  . Tobacco comment: Occasionally  Substance Use Topics  . Alcohol use: No    Alcohol/week: 0.0 standard drinks  . Drug use: No     Family Hx: The patient's family history includes Asthma in his son; Cancer in his maternal grandfather and mother; Hypertension in his brother, daughter, father, mother, and sister; Pancreatic cancer in his mother; Stroke in his father. There is no history of Colon polyps, Esophageal cancer, Rectal cancer, or Stomach cancer.  ROS:   Please see the history of present illness.    All other systems reviewed and are negative.   Prior CV studies:   The following studies were reviewed today: Echo 01/26/2020  Labs/Other Tests and Data Reviewed:    EKG:  An ECG dated 01/24/2020 was personally reviewed today and demonstrated:  AF with VR 110, LAD, inferior Qs  Recent Labs: 01/24/2020: ALT 27; BUN 22; Creatinine, Ser 1.30; Hemoglobin 17.0; Magnesium 1.8; Platelets 366; Potassium 4.0; Sodium 138; TSH 2.510   Recent Lipid Panel Lab Results  Component Value Date/Time   CHOL 146 05/23/2019 10:04 AM   TRIG 98.0 05/23/2019 10:04 AM   HDL 34.30 (L) 05/23/2019 10:04 AM   CHOLHDL 4 05/23/2019 10:04 AM   LDLCALC 92 05/23/2019 10:04 AM    Wt Readings from Last 3 Encounters:  02/15/20 250 lb (113.4 kg)  01/24/20 255 lb (115.7 kg)    11/14/19 254 lb 6 oz (115.4 kg)     Objective:    Vital Signs:  BP 131/69   Pulse (!) 120   Ht 6\' 2"  (1.88 m)   Wt 250 lb (113.4 kg)   BMI 32.10 kg/m    VITAL SIGNS:  reviewed  ASSESSMENT & PLAN:    PAF- Recurrent- symptomatic, with RVR Increase Toprol to 50 mg daily, continue Diltiazem CD 300 mg daily, plan OP DCCV next week.  Anticoagulated- On Eliquis  since 01/24/2020- no missed doses. CHADS VASC=3 for age, DM, and HTN  Essential HTN- B/P controlled- H/O angioedema, no ACE or ARB  NIDDM- On Glucophage  Sleep apnea- He has been intolerant to C-pap in the past  COVID-19 Education: The signs and symptoms of COVID-19 were discussed with the patient and how to seek care for testing (follow up with PCP or arrange E-visit).  The importance of social distancing was discussed today.  Time:   Today, I have spent 15 minutes with the patient with telehealth technology discussing the above problems.     Medication Adjustments/Labs and Tests Ordered: Current medicines are reviewed at length with the patient today.  Concerns regarding medicines are outlined above.   Tests Ordered: No orders of the defined types were placed in this encounter.   Medication Changes: No orders of the defined types were placed in this encounter.   Follow Up:  In Person Dr Debara Pickett 3-4 weeks  Signed, Kerin Ransom, PA-C  02/15/2020 1:47 PM    [facility]

## 2020-02-15 NOTE — Patient Instructions (Signed)
Medication Instructions:  INCREASE metoprolol succinate (Toprol XL) to 50 mg daily  *If you need a refill on your cardiac medications before your next appointment, please call your pharmacy*   Lab Work: BMET, CBC  If you have labs (blood work) drawn today and your tests are completely normal, you will receive your results only by: Marland Kitchen MyChart Message (if you have MyChart) OR . A paper copy in the mail If you have any lab test that is abnormal or we need to change your treatment, we will call you to review the results.   Testing/Procedures: Your physician has recommended that you have a Cardioversion (DCCV). Electrical Cardioversion uses a jolt of electricity to your heart either through paddles or wired patches attached to your chest. This is a controlled, usually prescheduled, procedure. Defibrillation is done under light anesthesia in the hospital, and you usually go home the day of the procedure. This is done to get your heart back into a normal rhythm. You are not awake for the procedure. Please see the instruction sheet given to you today.   Follow-Up: At [facility], you and your health needs are our priority.  As part of our continuing mission to provide you with exceptional heart care, we have created designated Provider Care Teams.  These Care Teams include your primary Cardiologist (physician) and Advanced Practice Providers (APPs -  Physician Assistants and Nurse Practitioners) who all work together to provide you with the care you need, when you need it.  We recommend signing up for the patient portal called "MyChart".  Sign up information is provided on this After Visit Summary.  MyChart is used to connect with patients for Virtual Visits (Telemedicine).  Patients are able to view lab/test results, encounter notes, upcoming appointments, etc.  Non-urgent messages can be sent to your provider as well.   To learn more about what you can do with MyChart, go to  [email or web address].    Your next appointment:   3-4 week(s) after cardioversion  The format for your next appointment:   In Person  Provider:   You may see Pixie Casino, MD or one of the following Advanced Practice Providers on your designated Care Team:    Almyra Deforest, PA-C  Fabian Sharp, PA-C or   Roby Lofts, [place]

## 2020-02-15 NOTE — H&P (View-Only) (Signed)
Virtual Visit via Telephone Note   This visit type was conducted due to national recommendations for restrictions regarding the COVID-19 Pandemic (e.g. social distancing) in an effort to limit this patient's exposure and mitigate transmission in our community.  Due to his co-morbid illnesses, this patient is at least at moderate risk for complications without adequate follow up.  This format is felt to be most appropriate for this patient at this time.  The patient did not have access to video technology/had technical difficulties with video requiring transitioning to audio format only (telephone).  All issues noted in this document were discussed and addressed.  No physical exam could be performed with this format.  Please refer to the patient's chart for his  consent to telehealth for [facility].   Date:  02/15/2020   ID:  [name], DOB November 02, 1946, MRN [id number]  Patient Location: Home Provider Location: Home  PCP:  Copland, Gay Filler, MD  Cardiologist:  Pixie Casino, MD  Electrophysiologist:  None   Evaluation Performed:  Follow-Up Visit  Chief Complaint:  none  History of Present Illness:    [name] is a pleasant 74 y.o. male with a hx of PAF, initially diagnosed in 2013.  He was scheduled for TEE DCCV then but converted spontaneously.  He was a CHADS VASC-1 and was not placed on long term anticoagulation then.  He never had an echo but a stress Myoview in 2009 showed he had normal LVF.   He did well until Oct 2020 when he presented to the ED with palpitations and was found to be in AF with RVR of less than 24 hours duration.  He was cardioverted at bedside.  He was placed on Eliquis then but stopped this after his prescription ran out.    I saw him in the office 01/24/2020 with c/o palpitations which started around 10 pm the prior evening.  He told me could tell he was in AF and at one point he says his HR was 140.  He denied any rest SOB or chest pain.  In  the office that day his HR was 100 in AF.    I added Toprol 25 mg daily (he was already on Diltiazem 300 mg) and Eliquis 5 mg BID.  After discussion with Dr Debara Pickett the plan was to see him in f/u in 3-4 weeks and plan on OP DCCV if he had remained in AF.  The patient was contacted today for follow up.  This was actually supposed to be an OV but it was changed to a virtual visit.  Today his heart rate is 117.  He said last week he had some days where he did not feel good and he took his heart rate and it was up around 120.  He is otherwise unaware of palpitations or tachycardia.  He was surprised today when his heart rate was 117 because he says he feels "pretty good".  It seems clearhe is still in atrial fibrillation. He tells me he has not missed any Eliquis doses  I will increase his Toprol to 50 mg daily and go ahead and arrange for outpatient DC cardioversion next week. The patient understands the risks and benefits of the procedure and is willing to proceed.   The patient does not have symptoms concerning for COVID-19 infection (fever, chills, cough, or new shortness of breath).    Past Medical History:  Diagnosis Date  . Allergic angioedema   . Allergy   .  Hypertension   . Non-insulin dependent type 2 diabetes mellitus ([place])   . PAF (paroxysmal atrial fibrillation) ([place])   . Pneumonia   . RAD (reactive airway disease)   . Sleep apnea    C-pap intolerant  . Tick bite    Past Surgical History:  Procedure Laterality Date  . APPENDECTOMY    . CHOLECYSTECTOMY    . COLONOSCOPY    . stress myoview  12/13/2008   EF 61% LV  normal  . TONSILLECTOMY       Current Meds  Medication Sig  . apixaban (ELIQUIS) 5 MG TABS tablet Take 1 tablet (5 mg total) by mouth 2 (two) times daily.  . cetirizine (ZYRTEC) 10 MG tablet Take 10 mg by mouth daily.  . chlorthalidone (HYGROTON) 25 MG tablet TAKE 1 TABLET BY MOUTH EVERY DAY  . diltiazem (CARDIZEM CD) 300 MG 24 hr capsule TAKE ONE CAPSULE BY MOUTH  EVERY DAY  . metFORMIN (GLUCOPHAGE-XR) 500 MG 24 hr tablet TAKE 2 TABLETS BY MOUTH TWICE A DAY  . metoprolol succinate (TOPROL XL) 25 MG 24 hr tablet Take 1 tablet (25 mg total) by mouth daily.  . pravastatin (PRAVACHOL) 20 MG tablet TAKE 1 TABLET BY MOUTH EVERY DAY     Allergies:   Ibuprofen, Lisinopril, Oxaprozin, Ziac [bisoprolol-hydrochlorothiazide], Bystolic [nebivolol hcl], Hctz [hydrochlorothiazide], and Norvasc [amlodipine besylate]   Social History   Tobacco Use  . Smoking status: Former Smoker    Types: Cigarettes, Pipe    Quit date: 10/05/1996    Years since quitting: 23.3  . Smokeless tobacco: Former Systems developer    Types: Chew  . Tobacco comment: Occasionally  Substance Use Topics  . Alcohol use: No    Alcohol/week: 0.0 standard drinks  . Drug use: No     Family Hx: The patient's family history includes Asthma in his son; Cancer in his maternal grandfather and mother; Hypertension in his brother, daughter, father, mother, and sister; Pancreatic cancer in his mother; Stroke in his father. There is no history of Colon polyps, Esophageal cancer, Rectal cancer, or Stomach cancer.  ROS:   Please see the history of present illness.    All other systems reviewed and are negative.   Prior CV studies:   The following studies were reviewed today: Echo 01/26/2020  Labs/Other Tests and Data Reviewed:    EKG:  An ECG dated 01/24/2020 was personally reviewed today and demonstrated:  AF with VR 110, LAD, inferior Qs  Recent Labs: 01/24/2020: ALT 27; BUN 22; Creatinine, Ser 1.30; Hemoglobin 17.0; Magnesium 1.8; Platelets 366; Potassium 4.0; Sodium 138; TSH 2.510   Recent Lipid Panel Lab Results  Component Value Date/Time   CHOL 146 05/23/2019 10:04 AM   TRIG 98.0 05/23/2019 10:04 AM   HDL 34.30 (L) 05/23/2019 10:04 AM   CHOLHDL 4 05/23/2019 10:04 AM   LDLCALC 92 05/23/2019 10:04 AM    Wt Readings from Last 3 Encounters:  02/15/20 250 lb (113.4 kg)  01/24/20 255 lb (115.7 kg)    11/14/19 254 lb 6 oz (115.4 kg)     Objective:    Vital Signs:  BP 131/69   Pulse (!) 120   Ht 6\' 2"  (1.88 m)   Wt 250 lb (113.4 kg)   BMI 32.10 kg/m    VITAL SIGNS:  reviewed  ASSESSMENT & PLAN:    PAF- Recurrent- symptomatic, with RVR Increase Toprol to 50 mg daily, continue Diltiazem CD 300 mg daily, plan OP DCCV next week.  Anticoagulated- On Eliquis  since 01/24/2020- no missed doses. CHADS VASC=3 for age, DM, and HTN  Essential HTN- B/P controlled- H/O angioedema, no ACE or ARB  NIDDM- On Glucophage  Sleep apnea- He has been intolerant to C-pap in the past  COVID-19 Education: The signs and symptoms of COVID-19 were discussed with the patient and how to seek care for testing (follow up with PCP or arrange E-visit).  The importance of social distancing was discussed today.  Time:   Today, I have spent 15 minutes with the patient with telehealth technology discussing the above problems.     Medication Adjustments/Labs and Tests Ordered: Current medicines are reviewed at length with the patient today.  Concerns regarding medicines are outlined above.   Tests Ordered: No orders of the defined types were placed in this encounter.   Medication Changes: No orders of the defined types were placed in this encounter.   Follow Up:  In Person Dr Debara Pickett 3-4 weeks  Signed, Kerin Ransom, PA-C  02/15/2020 1:47 PM    [facility]

## 2020-02-15 NOTE — Telephone Encounter (Signed)
No message needed °

## 2020-02-17 ENCOUNTER — Other Ambulatory Visit (HOSPITAL_COMMUNITY)
Admission: RE | Admit: 2020-02-17 | Discharge: 2020-02-17 | Disposition: A | Discharge status: Home / Self Care | Payer: Medicare HMO | Source: Ambulatory Visit | Attending: Cardiovascular Disease | Admitting: Cardiovascular Disease

## 2020-02-17 ENCOUNTER — Telehealth: Payer: Self-pay | Admitting: Internal Medicine

## 2020-02-17 DIAGNOSIS — Z20822 Contact with and (suspected) exposure to covid-19: Secondary | ICD-10-CM | POA: Diagnosis not present

## 2020-02-17 DIAGNOSIS — Z01812 Encounter for preprocedural laboratory examination: Secondary | ICD-10-CM

## 2020-02-17 LAB — SARS CORONAVIRUS 2 (TAT 6-24 HRS): SARS Coronavirus 2: NEGATIVE

## 2020-02-17 NOTE — Telephone Encounter (Signed)
Spoke with patient. Patient had his covid vaccine yesterday and now he is running a fever of 101.5, No other symptoms. Patient is scheduled for a Covid screening and lab work to be done today for a procedure on Tuesday. Advised patient to continue with his Covid screen. Patient will come to lab Monday for blood work, will change CBC and BMP order to STAT. Patient verbalized understanding.

## 2020-02-17 NOTE — Telephone Encounter (Signed)
° °  Pt had his covid vaccine yesterday. Today he said he has 101 temperature. Pt ask if he can come in to his covid test today at 9:25 and if this going to affect his scheduled procedure on 02/920  Please advise

## 2020-02-20 ENCOUNTER — Telehealth: Payer: Self-pay | Admitting: Family Medicine

## 2020-02-20 DIAGNOSIS — Z79899 Other long term (current) drug therapy: Secondary | ICD-10-CM | POA: Diagnosis not present

## 2020-02-20 DIAGNOSIS — Z01812 Encounter for preprocedural laboratory examination: Secondary | ICD-10-CM | POA: Diagnosis not present

## 2020-02-20 LAB — CBC
Hematocrit: 44.7 % (ref 37.5–51.0)
Hemoglobin: 15.5 g/dL (ref 13.0–17.7)
MCH: 30.8 pg (ref 26.6–33.0)
MCHC: 34.7 g/dL (ref 31.5–35.7)
MCV: 89 fL (ref 79–97)
Platelets: 315 10*3/uL (ref 150–450)
RBC: 5.03 x10E6/uL (ref 4.14–5.80)
RDW: 13.1 % (ref 11.6–15.4)
WBC: 7.1 10*3/uL (ref 3.4–10.8)

## 2020-02-20 LAB — BASIC METABOLIC PANEL
BUN/Creatinine Ratio: 17 (ref 10–24)
BUN: 18 mg/dL (ref 8–27)
CO2: 26 mmol/L (ref 20–29)
Calcium: 9.3 mg/dL (ref 8.6–10.2)
Chloride: 95 mmol/L — ABNORMAL LOW (ref 96–106)
Creatinine, Ser: 1.04 mg/dL (ref 0.76–1.27)
GFR calc Af Amer: 83 mL/min/{1.73_m2} (ref >59)
GFR calc non Af Amer: 72 mL/min/{1.73_m2} (ref >59)
Glucose: 194 mg/dL — ABNORMAL HIGH (ref 65–99)
Potassium: 4.4 mmol/L (ref 3.5–5.2)
Sodium: 136 mmol/L (ref 134–144)

## 2020-02-20 NOTE — Chronic Care Management (AMB) (Signed)
°  Chronic Care Management   Note  02/20/2020 Name: [name] MRN: [id number] DOB: 28-Nov-1946  [name] is a 74 y.o. year old male who is a primary care patient of Copland, Gay Filler, MD. I reached out to [name] by phone today in response to a referral sent by [name] PCP, Copland, Gay Filler, MD.   [name] was given information about Chronic Care Management services today including:  1. CCM service includes personalized support from designated clinical staff supervised by his physician, including individualized plan of care and coordination with other care providers 2. 24/7 contact phone numbers for assistance for urgent and routine care needs. 3. Service will only be billed when office clinical staff spend 20 minutes or more in a month to coordinate care. 4. Only one practitioner may furnish and bill the service in a calendar month. 5. The patient may stop CCM services at any time (effective at the end of the month) by phone call to the office staff.   Patient agreed to services and verbal consent obtained.   Follow up plan:   Raynicia Dukes [employer] Scheduler

## 2020-02-21 ENCOUNTER — Other Ambulatory Visit: Payer: Self-pay

## 2020-02-21 ENCOUNTER — Ambulatory Visit (HOSPITAL_COMMUNITY)
Admission: RE | Admit: 2020-02-21 | Discharge: 2020-02-21 | Disposition: A | Discharge status: Home / Self Care | Payer: Medicare HMO | Attending: Cardiovascular Disease | Admitting: Cardiovascular Disease

## 2020-02-21 ENCOUNTER — Encounter (HOSPITAL_COMMUNITY): Payer: Self-pay | Admitting: Cardiovascular Disease

## 2020-02-21 ENCOUNTER — Encounter (HOSPITAL_COMMUNITY)
Admission: RE | Disposition: A | Discharge status: Home / Self Care | Payer: Medicare HMO | Source: Home / Self Care | Attending: Cardiovascular Disease

## 2020-02-21 ENCOUNTER — Ambulatory Visit (HOSPITAL_COMMUNITY): Payer: Medicare HMO | Admitting: Certified Registered Nurse Anesthetist

## 2020-02-21 DIAGNOSIS — R001 Bradycardia, unspecified: Secondary | ICD-10-CM | POA: Insufficient documentation

## 2020-02-21 DIAGNOSIS — I10 Essential (primary) hypertension: Secondary | ICD-10-CM | POA: Insufficient documentation

## 2020-02-21 DIAGNOSIS — G4730 Sleep apnea, unspecified: Secondary | ICD-10-CM | POA: Diagnosis not present

## 2020-02-21 DIAGNOSIS — Z888 Allergy status to other drugs, medicaments and biological substances status: Secondary | ICD-10-CM | POA: Diagnosis not present

## 2020-02-21 DIAGNOSIS — Z7984 Long term (current) use of oral hypoglycemic drugs: Secondary | ICD-10-CM | POA: Diagnosis not present

## 2020-02-21 DIAGNOSIS — E119 Type 2 diabetes mellitus without complications: Secondary | ICD-10-CM | POA: Insufficient documentation

## 2020-02-21 DIAGNOSIS — Z7901 Long term (current) use of anticoagulants: Secondary | ICD-10-CM | POA: Insufficient documentation

## 2020-02-21 DIAGNOSIS — Z87891 Personal history of nicotine dependence: Secondary | ICD-10-CM | POA: Diagnosis not present

## 2020-02-21 DIAGNOSIS — Z8249 Family history of ischemic heart disease and other diseases of the circulatory system: Secondary | ICD-10-CM | POA: Diagnosis not present

## 2020-02-21 DIAGNOSIS — I4892 Unspecified atrial flutter: Secondary | ICD-10-CM | POA: Diagnosis not present

## 2020-02-21 DIAGNOSIS — Z886 Allergy status to analgesic agent status: Secondary | ICD-10-CM | POA: Diagnosis not present

## 2020-02-21 DIAGNOSIS — I483 Typical atrial flutter: Secondary | ICD-10-CM

## 2020-02-21 DIAGNOSIS — I480 Paroxysmal atrial fibrillation: Secondary | ICD-10-CM | POA: Insufficient documentation

## 2020-02-21 DIAGNOSIS — I440 Atrioventricular block, first degree: Secondary | ICD-10-CM | POA: Diagnosis not present

## 2020-02-21 DIAGNOSIS — Z79899 Other long term (current) drug therapy: Secondary | ICD-10-CM | POA: Diagnosis not present

## 2020-02-21 HISTORY — PX: CARDIOVERSION: SHX1299

## 2020-02-21 LAB — GLUCOSE, CAPILLARY: Glucose-Capillary: 197 mg/dL — ABNORMAL HIGH (ref 70–99)

## 2020-02-21 SURGERY — CARDIOVERSION
Anesthesia: General

## 2020-02-21 MED ORDER — APIXABAN 5 MG PO TABS
5.0000 mg | ORAL_TABLET | ORAL | Status: DC
  Filled 2020-02-21: qty 1

## 2020-02-21 MED ORDER — PROPOFOL 10 MG/ML IV BOLUS
INTRAVENOUS | Status: DC | PRN
  Administered 2020-02-21: 20 mg via INTRAVENOUS
  Administered 2020-02-21: 40 mg via INTRAVENOUS
  Administered 2020-02-21: 10 mg via INTRAVENOUS

## 2020-02-21 MED ORDER — SODIUM CHLORIDE 0.9 % IV SOLN
INTRAVENOUS | Status: AC | PRN
  Administered 2020-02-21: 500 mL via INTRAVENOUS

## 2020-02-21 NOTE — Anesthesia Postprocedure Evaluation (Signed)
Anesthesia Post Note  Patient: [name]  Procedure(s) Performed: CARDIOVERSION (N/A )     Patient location during evaluation: Endoscopy Anesthesia Type: MAC Level of consciousness: awake and alert Pain management: pain level controlled Vital Signs Assessment: post-procedure vital signs reviewed and stable Respiratory status: spontaneous breathing, nonlabored ventilation and respiratory function stable Cardiovascular status: blood pressure returned to baseline and stable Postop Assessment: no apparent nausea or vomiting Anesthetic complications: no    Last Vitals:  Vitals:   02/21/20 0755 02/21/20 0800  BP: 101/61 101/60  Pulse: 66 66  Resp: 17 19  Temp: 37.1 C   SpO2: 99% 99%    Last Pain:  Vitals:   02/21/20 0755  TempSrc: Oral  PainSc: 0-No pain                 Lidia Collum

## 2020-02-21 NOTE — Discharge Instructions (Signed)
Electrical Cardioversion Electrical cardioversion is the delivery of a jolt of electricity to restore a normal rhythm to the heart. A rhythm that is too fast or is not regular keeps the heart from pumping well. In this procedure, sticky patches or metal paddles are placed on the chest to deliver electricity to the heart from a device. This procedure may be done in an emergency if:  There is low or no blood pressure as a result of the heart rhythm.  Normal rhythm must be restored as fast as possible to protect the brain and heart from further damage.  It may save a life. This may also be a scheduled procedure for irregular or fast heart rhythms that are not immediately life-threatening. Tell a health care provider about:  Any allergies you have.  All medicines you are taking, including vitamins, herbs, eye drops, creams, and over-the-counter medicines.  Any problems you or family members have had with anesthetic medicines.  Any blood disorders you have.  Any surgeries you have had.  Any medical conditions you have.  Whether you are pregnant or may be pregnant. What are the risks? Generally, this is a safe procedure. However, problems may occur, including:  Allergic reactions to medicines.  A blood clot that breaks free and travels to other parts of your body.  The possible return of an abnormal heart rhythm within hours or days after the procedure.  Your heart stopping (cardiac arrest). This is rare. What happens before the procedure? Medicines  Your health care provider may have you start taking: ? Blood-thinning medicines (anticoagulants) so your blood does not clot as easily. ? Medicines to help stabilize your heart rate and rhythm.  Ask your health care provider about: ? Changing or stopping your regular medicines. This is especially important if you are taking diabetes medicines or blood thinners. ? Taking medicines such as aspirin and ibuprofen. These medicines can  thin your blood. Do not take these medicines unless your health care provider tells you to take them. ? Taking over-the-counter medicines, vitamins, herbs, and supplements. General instructions  Follow instructions from your health care provider about eating or drinking restrictions.  Plan to have someone take you home from the hospital or clinic.  If you will be going home right after the procedure, plan to have someone with you for 24 hours.  Ask your health care provider what steps will be taken to help prevent infection. These may include washing your skin with a germ-killing soap. What happens during the procedure?   An IV will be inserted into one of your veins.  Sticky patches (electrodes) or metal paddles may be placed on your chest.  You will be given a medicine to help you relax (sedative).  An electrical shock will be delivered. The procedure may vary among health care providers and hospitals. What can I expect after the procedure?  Your blood pressure, heart rate, breathing rate, and blood oxygen level will be monitored until you leave the hospital or clinic.  Your heart rhythm will be watched to make sure it does not change.  You may have some redness on the skin where the shocks were given. Follow these instructions at home:  Do not drive for 24 hours if you were given a sedative during your procedure.  Take over-the-counter and prescription medicines only as told by your health care provider.  Ask your health care provider how to check your pulse. Check it often.  Rest for 48 hours after the procedure or   as told by your health care provider.  Avoid or limit your caffeine use as told by your health care provider.  Keep all follow-up visits as told by your health care provider. This is important. Contact a health care provider if:  You feel like your heart is beating too quickly or your pulse is not regular.  You have a serious muscle cramp that does not go  away. Get help right away if:  You have discomfort in your chest.  You are dizzy or you feel faint.  You have trouble breathing or you are short of breath.  Your speech is slurred.  You have trouble moving an arm or leg on one side of your body.  Your fingers or toes turn cold or blue. Summary  Electrical cardioversion is the delivery of a jolt of electricity to restore a normal rhythm to the heart.  This procedure may be done right away in an emergency or may be a scheduled procedure if the condition is not an emergency.  Generally, this is a safe procedure.  After the procedure, check your pulse often as told by your health care provider. This information is not intended to replace advice given to you by your health care provider. Make sure you discuss any questions you have with your health care provider. Document Revised: 07/04/2019 Document Reviewed: 07/04/2019 Elsevier Patient Education  2020 [employer]  

## 2020-02-21 NOTE — Anesthesia Procedure Notes (Signed)
Procedure Name: General with mask airway Date/Time: 02/21/2020 7:41 AM Performed by: Janace Litten, CRNA Pre-anesthesia Checklist: Patient identified, Emergency Drugs available, Suction available and Patient being monitored Patient Re-evaluated:Patient Re-evaluated prior to induction Oxygen Delivery Method: Ambu bag Preoxygenation: Pre-oxygenation with 100% oxygen Induction Type: IV induction

## 2020-02-21 NOTE — CV Procedure (Signed)
Electrical Cardioversion Procedure Note DIMA HOAK [id number] 1946/03/14  Procedure: Electrical Cardioversion Indications:  Atrial Flutter  Procedure Details Consent: Risks of procedure as well as the alternatives and risks of each were explained to the (patient/caregiver).  Consent for procedure obtained. Time Out: Verified patient identification, verified procedure, site/side was marked, verified correct patient position, special equipment/implants available, medications/allergies/relevent history reviewed, required imaging and test results available.  Performed  Patient placed on cardiac monitor, pulse oximetry, supplemental oxygen as necessary.  Sedation given: propofol Pacer pads placed anterior and posterior chest.  Cardioverted 1 time(s).  Cardioverted at 150J.  Evaluation Findings: Post procedure EKG shows: sinus bradycardia with first degree heart block Complications: None Patient did tolerate procedure well.   Skeet Latch, MD 02/21/2020, 7:48 AM

## 2020-02-21 NOTE — Anesthesia Preprocedure Evaluation (Signed)
Anesthesia Evaluation  Patient identified by MRN, date of birth, ID band Patient awake    Reviewed: Allergy & Precautions, NPO status , Patient's Chart, lab work & pertinent test results  History of Anesthesia Complications Negative for: history of anesthetic complications  Airway Mallampati: II  TM Distance: >3 FB Neck ROM: Full    Dental   Pulmonary sleep apnea , former smoker,    Pulmonary exam normal        Cardiovascular hypertension, + dysrhythmias Atrial Fibrillation  Rhythm:Irregular     Neuro/Psych negative neurological ROS  negative psych ROS   GI/Hepatic negative GI ROS, Neg liver ROS,   Endo/Other  diabetes, Type 2  Renal/GU negative Renal ROS  negative genitourinary   Musculoskeletal negative musculoskeletal ROS (+)   Abdominal   Peds  Hematology negative hematology ROS (+)   Anesthesia Other Findings  Echo 01/26/20: EF 55-60%, normal valves  Reproductive/Obstetrics                           Anesthesia Physical Anesthesia Plan  ASA: III  Anesthesia Plan: General   Post-op Pain Management:    Induction: Intravenous  PONV Risk Score and Plan: 2 and TIVA and Treatment may vary due to age or medical condition  Airway Management Planned: Mask  Additional Equipment: None  Intra-op Plan:   Post-operative Plan:   Informed Consent: I have reviewed the patients History and Physical, chart, labs and discussed the procedure including the risks, benefits and alternatives for the proposed anesthesia with the patient or authorized representative who has indicated his/her understanding and acceptance.       Plan Discussed with:   Anesthesia Plan Comments:         Anesthesia Quick Evaluation

## 2020-02-21 NOTE — Transfer of Care (Signed)
Immediate Anesthesia Transfer of Care Note  Patient: [name]  Procedure(s) Performed: CARDIOVERSION (N/A )  Patient Location: Endoscopy Unit  Anesthesia Type:General  Level of Consciousness: drowsy, patient cooperative and responds to stimulation  Airway & Oxygen Therapy: Patient Spontanous Breathing  Post-op Assessment: Report given to RN and Post -op Vital signs reviewed and stable  Post vital signs: Reviewed and stable  Last Vitals:  Vitals Value Taken Time  BP 90/67 02/21/20 0748  Temp    Pulse 65 02/21/20 0748  Resp 20 02/21/20 0748  SpO2 100 % 02/21/20 0748    Last Pain:  Vitals:   02/21/20 0652  TempSrc: Oral  PainSc: 0-No pain         Complications: No apparent anesthesia complications

## 2020-02-21 NOTE — Interval H&P Note (Signed)
History and Physical Interval Note:  02/21/2020 7:42 AM  [name]  has presented today for surgery, with the diagnosis of AFIB.  The various methods of treatment have been discussed with the patient and family. After consideration of risks, benefits and other options for treatment, the patient has consented to  Procedure(s): CARDIOVERSION (N/A) as a surgical intervention.  The patient's history has been reviewed, patient examined, no change in status, stable for surgery.  I have reviewed the patient's chart and labs.  Questions were answered to the patient's satisfaction.     Skeet Latch, MD

## 2020-02-22 ENCOUNTER — Encounter: Payer: Self-pay | Admitting: *Deleted

## 2020-03-07 ENCOUNTER — Other Ambulatory Visit: Payer: Self-pay

## 2020-03-07 DIAGNOSIS — E119 Type 2 diabetes mellitus without complications: Secondary | ICD-10-CM

## 2020-03-07 DIAGNOSIS — I10 Essential (primary) hypertension: Secondary | ICD-10-CM

## 2020-03-07 DIAGNOSIS — R739 Hyperglycemia, unspecified: Secondary | ICD-10-CM

## 2020-03-08 ENCOUNTER — Other Ambulatory Visit: Payer: Self-pay

## 2020-03-08 ENCOUNTER — Telehealth: Payer: Self-pay | Admitting: Internal Medicine

## 2020-03-08 ENCOUNTER — Ambulatory Visit: Payer: Medicare HMO | Admitting: Pharmacist

## 2020-03-08 DIAGNOSIS — E119 Type 2 diabetes mellitus without complications: Secondary | ICD-10-CM

## 2020-03-08 DIAGNOSIS — E785 Hyperlipidemia, unspecified: Secondary | ICD-10-CM

## 2020-03-08 DIAGNOSIS — I10 Essential (primary) hypertension: Secondary | ICD-10-CM

## 2020-03-08 DIAGNOSIS — I4892 Unspecified atrial flutter: Secondary | ICD-10-CM

## 2020-03-08 NOTE — Telephone Encounter (Signed)
  Patient wants to know if he should have stopped his metoprolol succinate (TOPROL XL) 25 MG 24 hr tablet and apixaban (ELIQUIS) 5 MG TABS tablet since he had the cardioversion. Please advise.

## 2020-03-08 NOTE — Patient Instructions (Signed)
Visit Information  Goals Addressed            This Visit's Progress   . A1c goal less than 7 to 7.5%      . Blood pressure goal <140/90      . Determine medication regimen with Dr. Debara Pickett during appointment on 03/14/20      . HDL goal greater than 40      . LDL goal less than 100      . Pharmacy Care Plan       CARE PLAN ENTRY  Current Barriers:  . Chronic Disease Management support, education, and care coordination needs related to AFib, DM, HTN, Low HDL, Allergic Rhinitis  Pharmacist Clinical Goal(s):  Marland Kitchen Determine medication regimen with Dr. Debara Pickett . Blood pressure goal <140/90 . A1c goal <7-7.5% . LDL goal <100 . HDL goal >40  Interventions: . Comprehensive medication review performed. Marland Kitchen Collaboration with provider regarding medication management (called Dr. Lysbeth Penner office on patient's behalf for clarification of Afib regimen)  Patient Self Care Activities:  . Patient verbalizes understanding of plan to follow as described above, Self administers medications as prescribed, Calls pharmacy for medication refills, and Calls provider office for new concerns or questions  Initial goal documentation        [name] was given information about Chronic Care Management services today including:  1. CCM service includes personalized support from designated clinical staff supervised by his physician, including individualized plan of care and coordination with other care providers 2. 24/7 contact phone numbers for assistance for urgent and routine care needs. 3. Standard insurance, coinsurance, copays and deductibles apply for chronic care management only during months in which we provide at least 20 minutes of these services. Most insurances cover these services at 100%, however patients may be responsible for any copay, coinsurance and/or deductible if applicable. This service may help you avoid the need for more expensive face-to-face services. 4. Only one practitioner may  furnish and bill the service in a calendar month. 5. The patient may stop CCM services at any time (effective at the end of the month) by phone call to the office staff.  Patient agreed to services and verbal consent obtained.   The patient verbalized understanding of instructions provided today and agreed to receive a mailed copy of patient instruction and/or educational materials. Telephone follow up appointment with pharmacy team member scheduled for: 03/22/20  [name], PharmD Clinical Pharmacist [facility] [phone]   Mindfulness-Based Stress Reduction Mindfulness-based stress reduction (MBSR) is a program that helps people learn to practice mindfulness. Mindfulness is the practice of intentionally paying attention to the present moment. It can be learned and practiced through techniques such as education, breathing exercises, meditation, and yoga. MBSR includes several mindfulness techniques in one program. MBSR works best when you understand the treatment, are willing to try new things, and can commit to spending time practicing what you learn. MBSR training may include learning about:  How your emotions, thoughts, and reactions affect your body.  New ways to respond to things that cause negative thoughts to start (triggers).  How to notice your thoughts and let go of them.  Practicing awareness of everyday things that you normally do without thinking.  The techniques and goals of different types of meditation. What are the benefits of MBSR? MBSR can have many benefits, which include helping you to:  Develop self-awareness. This refers to knowing and understanding yourself.  Learn skills and attitudes that  help you to participate in your own health care.  Learn new ways to care for yourself.  Be more accepting about how things are, and let things go.  Be less judgmental and approach things with an open mind.  Be patient with  yourself and trust yourself more. MBSR has also been shown to:  Reduce negative emotions, such as depression and anxiety.  Improve memory and focus.  Change how you sense and approach pain.  Boost your body's ability to fight infections.  Help you connect better with other people.  Improve your sense of well-being. Follow these instructions at home:   Find a local in-person or online MBSR program.  Set aside some time regularly for mindfulness practice.  Find a mindfulness practice that works best for you. This may include one or more of the following: ? Meditation. Meditation involves focusing your mind on a certain thought or activity. ? Breathing awareness exercises. These help you to stay present by focusing on your breath. ? Body scan. For this practice, you lie down and pay attention to each part of your body from head to toe. You can identify tension and soreness and intentionally relax parts of your body. ? Yoga. Yoga involves stretching and breathing, and it can improve your ability to move and be flexible. It can also provide an experience of testing your body's limits, which can help you release stress. ? Mindful eating. This way of eating involves focusing on the taste, texture, color, and smell of each bite of food. Because this slows down eating and helps you feel full sooner, it can be an important part of a weight-loss plan.  Find a podcast or recording that provides guidance for breathing awareness, body scan, or meditation exercises. You can listen to these any time when you have a free moment to rest without distractions.  Follow your treatment plan as told by your health care provider. This may include taking regular medicines and making changes to your diet or lifestyle as recommended. How to practice mindfulness To do a basic awareness exercise:  Find a comfortable place to sit.  Pay attention to the present moment. Observe your thoughts, feelings, and  surroundings just as they are.  Avoid placing judgment on yourself, your feelings, or your surroundings. Make note of any judgment that comes up, and let it go.  Your mind may wander, and that is okay. Make note of when your thoughts drift, and return your attention to the present moment. To do basic mindfulness meditation:  Find a comfortable place to sit. This may include a stable chair or a firm floor cushion. ? Sit upright with your back straight. Let your arms fall next to your side with your hands resting on your legs. ? If sitting in a chair, rest your feet flat on the floor. ? If sitting on a cushion, cross your legs in front of you.  Keep your head in a neutral position with your chin dropped slightly. Relax your jaw and rest the tip of your tongue on the roof of your mouth. Drop your gaze to the floor. You can close your eyes if you like.  Breathe normally and pay attention to your breath. Feel the air moving in and out of your nose. Feel your belly expanding and relaxing with each breath.  Your mind may wander, and that is okay. Make note of when your thoughts drift, and return your attention to your breath.  Avoid placing judgment on yourself, your feelings,  or your surroundings. Make note of any judgment or feelings that come up, let them go, and bring your attention back to your breath.  When you are ready, lift your gaze or open your eyes. Pay attention to how your body feels after the meditation. Where to find more information You can find more information about MBSR from:  Your health care provider.  Community-based meditation centers or programs.  Programs offered near you. Summary  Mindfulness-based stress reduction (MBSR) is a program that teaches you how to intentionally pay attention to the present moment. It is used with other treatments to help you cope better with daily stress, emotions, and pain.  MBSR focuses on developing self-awareness, which allows you  to respond to life stress without judgment or negative emotions.  MBSR programs may involve learning different mindfulness practices, such as breathing exercises, meditation, yoga, body scan, or mindful eating. Find a mindfulness practice that works best for you, and set aside time for it on a regular basis. This information is not intended to replace advice given to you by your health care provider. Make sure you discuss any questions you have with your health care provider. Document Revised: 11/13/2017 Document Reviewed: 04/09/2017 Elsevier Patient Education  [place].

## 2020-03-08 NOTE — Telephone Encounter (Signed)
Attempted to contact pt. Unable to leave message as line continues to ring then goes busy.

## 2020-03-08 NOTE — Chronic Care Management (AMB) (Signed)
Chronic Care Management Pharmacy  Name: [name]  MRN: [id number] DOB: Oct 17, 1946  Chief Complaint/ HPI  [name],  74 y.o. , male presents for their Initial CCM visit with the clinical pharmacist via telephone due to COVID-19 Pandemic.  PCP : Darreld Mclean, MD  Their chronic conditions include: AFib, DM, HTN, Low HDL, Allergic Rhinitis  Office Visits: 11/14/19: Visit w/ Dr. Lorelei Pont - Back pain x2 months. Has tried ice, NSAIDs and stretching. Methocarbamol prescribed with possible referral to ortho.   Consult Visit: 02/21/20: Cardioversion w/ Dr. Oval Linsey - Converted to NSR.   02/15/20: Cardio visit w/ Kerin Ransom, PA-C - Oct 2020 found to be in AF with RVR. Cardioverted at bedside. Placed on eliquis then stopped when prescription ran out. On 01/24/20 pt had symptomatic AF, was placed on toprol 25mg  and Eliquis 5mg  BID. After discussion w/ Dr. Debara Pickett plan to see pt in 3-4 weeks for OP DCCV if he remained in AF. HR [name] of visit 117. Increased Toprol ot 50mg  daily and arranged for outpatient cardioversion  01/26/20: ECHO - LVEF 55-60%, unremarkable  01/24/20: Cardio visit w/ Kerin Ransom, PA-C - Pt having palpitations with HR in 140. HR in clinic 100 and pt in AF. EKG ordered. VR - 108 and AF confirmed. Start Eliquis 5mg  BID. Requires 4 weeks of anticoag before cardioversion. Start metoprolol succinate 25mg  daily. Consulting with Dr. Debara Pickett. Labs ordered (cmp, cbc, mg, tsh)  12/14/19: Derm visit w/ Dr. Ronnald Ramp  10/13/19: Cardio visit w/ Fabian Sharp, PA - Pt in Afib and flutter with RVR. EKG showed NSR, first degree heart block and left axis deviation. No med changes noted.   ED Visit [facility]/22/19[facility] - Afib w/ RVR. Pt cardioverted and prescribed Eliquis. Follow up with Dr. Debara Pickett.     Medications: Outpatient Encounter Medications as of 03/08/2020  Medication Sig Note  . acetaminophen (TYLENOL) 325 MG tablet Take 650 mg by mouth daily as needed for moderate pain.    Marland Kitchen apixaban (ELIQUIS) 5 MG TABS tablet Take 1 tablet (5 mg total) by mouth 2 (two) times daily.   . cetirizine (ZYRTEC) 10 MG tablet Take 10 mg by mouth daily.   . chlorthalidone (HYGROTON) 25 MG tablet TAKE 1 TABLET BY MOUTH EVERY [name] (Patient taking differently: Take 25 mg by mouth daily. )   . diltiazem (CARDIZEM CD) 300 MG 24 hr capsule TAKE ONE CAPSULE BY MOUTH EVERY [name] (Patient taking differently: Take 300 mg by mouth daily. )   . metFORMIN (GLUCOPHAGE-XR) 500 MG 24 hr tablet TAKE 2 TABLETS BY MOUTH TWICE A [name] (Patient taking differently: Take 1,000 mg by mouth in the morning and at bedtime. )   . pravastatin (PRAVACHOL) 20 MG tablet TAKE 1 TABLET BY MOUTH EVERY [name] (Patient taking differently: Take 20 mg by mouth daily. )   . metoprolol succinate (TOPROL XL) 25 MG 24 hr tablet Take 1 tablet (25 mg total) by mouth daily. (Patient not taking: Reported on 03/08/2020) 03/08/2020: Stopped taking the [name] after cardioversion (~02/22/20)   No facility-administered encounter medications on file as of 03/08/2020.    Current Diagnosis/Assessment:  Goals Addressed            This Visit's Progress   . A1c goal less than 7 to 7.5%      . Blood pressure goal <140/90      . Determine medication regimen with Dr. Debara Pickett during appointment on 03/14/20      . HDL goal  greater than 40      . LDL goal less than 100      . Pharmacy Care Plan       CARE PLAN ENTRY  Current Barriers:  . Chronic Disease Management support, education, and care coordination needs related to AFib, DM, HTN, Low HDL, Allergic Rhinitis  Pharmacist Clinical Goal(s):  Marland Kitchen Determine medication regimen with Dr. Debara Pickett . Blood pressure goal <140/90 . A1c goal <7-7.5% . LDL goal <100 . HDL goal >40  Interventions: . Comprehensive medication review performed. Marland Kitchen Collaboration with provider regarding medication management (called Dr. Lysbeth Penner office on patient's behalf for clarification of Afib regimen)  Patient Self Care Activities:   . 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 Very busy time of year for him right now. 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   Married. Wife with hx of cancer.   AFIB   CBC Latest Ref Rng & Units 02/20/2020 01/24/2020 09/22/2019  WBC 3.4 - 10.8 x10E3/uL 7.1 9.8 10.0  Hemoglobin 13.0 - 17.7 g/dL 15.5 17.0 16.7  Hematocrit 37.5 - 51.0 % 44.7 47.6 45.6  Platelets 150 - 450 x10E3/uL 315 366 329   Patient is currently rate controlled. HR: 80s BPM per patient  Patient has failed these meds in past: None noted  Patient is currently controlled on the following medications: eliquis 5mg  BID, diltiazem 300mg  daily (has been on since diagnosis), metoprolol 50mg  daily (stopped on 02/22/20)  Patient states he was dx long ago, but only recently had issues with Afib. He wonders what could have caused this.  Mentioned to patient that he seems very busy and stress can contribute to recurrent Afib. He agrees and states he will consider taking a vacation.  Stopped taking metoprolol [name] after his cardioversion He was not instructed to stop metoprolol, but figured he did not need it any further He wonders if this was the correct thing to do. He also wonders if he has to continue Eliquis. Will defer to cardio. Patient is scheduled to see Dr. Debara Pickett next week (03/14/20), feel this should be answered prior to appt.  Called Dr. Lysbeth Penner office, explained the above situation, and was informed by Maudie Mercury that she would send the message forward and someone would contact the patient directly. Called patient back to inform him of this. He verbalized understanding.  We discussed:  The reason Eliquis is used in AFib along  with the risk/benefit of continuing and discontining Eliquis therapy  Plan -Continue current medications  -Make sure to make appt with Dr. Debara Pickett on 3/31 -Follow up with Melvenia Beam on 03/22/20 to discuss any possibly medication changes from Dr. Debara Pickett   Diabetes   Recent Relevant Labs: Lab Results  Component Value Date/Time   HGBA1C 7.2 (H) 11/14/2019 12:01 PM   HGBA1C 6.9 (H) 05/23/2019 10:04 AM   MICROALBUR 1.1 05/23/2019 10:04 AM   MICROALBUR 0.5 04/07/2018 11:16 AM    Goal <7%, could consider goal of <7.5%, but patient has limited comorbidities   Checking BG: Weekly (2-3 times)  Usually less than 150 in the mornings In the last few weeks... Highest: 180 Lowest: 130  Patient has failed these meds in past: None noted  Patient is currently stable  on the following medications: metformin 500mg  2 BID  Was going to the gym 5 days a week, but has stopped due to covid   Last diabetic Foot exam: No results found for: HMDIABEYEEXA  Last diabetic Eye exam: No results found for: HMDIABFOOTEX   Plan -Continue current medications   Hypertension   BP today is:  Unable to assess due to phone visit  Office blood pressures are  BP Readings from Last 3 Encounters:  02/21/20 114/68  02/15/20 131/69  01/24/20 124/60    Patient has failed these meds in the past: lisinopril (swelling), bisoprolol-hctz (swelling), amlodipine (listed in allergies) Patient is currently controlled on the following medications: chlorthalidone 25mg  daily (says he started this when his ankles started to swell), metoprolol succinate 50 daily, diltiazem 300mg  every Frederico Gerling  Patient checks BP at home when feeling symptomatic   Patient home BP readings are ranging: 120s-130s/70s-80s HR in the 80s  Plan -Continue current medications   Lipid Assessment   Lipid Panel     Component Value Date/Time   CHOL 146 05/23/2019 1004   TRIG 98.0 05/23/2019 1004   HDL 34.30 (L) 05/23/2019 1004   CHOLHDL 4 05/23/2019 1004    VLDL 19.6 05/23/2019 1004   LDLCALC 92 05/23/2019 1004    LDL goal <100  The 10-year ASCVD risk score Mikey Bussing DC Jr., et al., 2013) is: 33%   Values used to calculate the score:     Age: 12 years     Sex: Male     Is Non-Hispanic African American: No     Diabetic: Yes     Tobacco smoker: No     Systolic Blood Pressure: 99991111 mmHg     Is BP treated: Yes     HDL Cholesterol: 34.3 mg/dL     Total Cholesterol: 146 mg/dL   Patient has failed these meds in past: None noted  Patient is currently uncontrolled for HDL, stable on the following medications: pravastatin 20mg  daily  We discussed:  HDL goal and indication for statin therapy  Plan -Continue current medications  Allergic Rhinitis    Patient has failed these meds in past: None noted  Patient is currently controlled on the following medications: cetirizine 10mg  daily  Plan -Continue current medications

## 2020-03-08 NOTE — Telephone Encounter (Signed)
Reid is returning [employer] call.

## 2020-03-08 NOTE — Telephone Encounter (Signed)
Spoke with patient. Per AVS from cardioversion no medication changes were made. Patient has follow up appointment with Dr. Debara Pickett on 3/31 and can discuss medications further at that time. Patient verbalized understanding.

## 2020-03-12 DIAGNOSIS — R69 Illness, unspecified: Secondary | ICD-10-CM | POA: Diagnosis not present

## 2020-03-14 ENCOUNTER — Other Ambulatory Visit: Payer: Self-pay

## 2020-03-14 ENCOUNTER — Encounter: Payer: Self-pay | Admitting: Internal Medicine

## 2020-03-14 ENCOUNTER — Ambulatory Visit: Payer: Medicare HMO | Admitting: Internal Medicine

## 2020-03-14 VITALS — BP 118/68 | HR 70 | Temp 97.2°F | Ht 74.0 in | Wt 258.4 lb

## 2020-03-14 DIAGNOSIS — I10 Essential (primary) hypertension: Secondary | ICD-10-CM | POA: Diagnosis not present

## 2020-03-14 DIAGNOSIS — G4730 Sleep apnea, unspecified: Secondary | ICD-10-CM | POA: Diagnosis not present

## 2020-03-14 DIAGNOSIS — I4892 Unspecified atrial flutter: Secondary | ICD-10-CM | POA: Diagnosis not present

## 2020-03-14 DIAGNOSIS — I440 Atrioventricular block, first degree: Secondary | ICD-10-CM

## 2020-03-14 NOTE — Progress Notes (Signed)
OFFICE NOTE  Chief Complaint:  Follow-up atrial flutter  Primary Care Physician: Darreld Mclean, MD  HPI:  [name] is a 74 year old gentleman with a history of atrial flutter which is paroxysmal. He was discharged on Xarelto and had an outpatient cardioversion, was found to be in sinus spontaneously. He did feel much better after he converted and was very aware of his atrial flutter. He was, however, on diltiazem despite the fact that he went into his abnormal rhythm and so I added a low-dose beta blocker which seems to have controlled his rate and blood pressure better. He does feel a little more tired easily which he contributes possibly to the medicine, however, his wife noted that he was a little more depressed. I am not clear if this is related to the beta blocker or other factors. He has had no recurrence of atrial flutter or fibrillation that I can tell and was taken off Xarelto. His CHADS2 score is 1, and I feel like he is at low risk given his age of 79. With the low burden of atrial fibrillation it is very reasonable.  He reports no further reoccurrence of atrial fibrillation or flutter. Recently he's been having problems with high blood pressure and notes that he has several intolerances and/or serious allergies to medications, including angioedema to ACE inhibitors and ARB medications.  He apparently has an allergy to hydrochlorothiazide, but does not recall whether that was rash or not.  [name] returns today for follow-up. He is without complaint. He continues to do both aerobic and resistance exercise without any limitations. In fact she's put on a lot of muscle mass. He denies any chest pain or shortness of breath. He's had no recurrent palpitations or atrial fibrillation or atrial flutter. He has well-controlled blood pressure. He denies any edema on the chlorthalidone which was started at his last office visit.  [name] returns today for follow-up of atrial  flutter. 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 He also was seen by Dr. Linus Salmons with infectious diseases and Dr. Havery Moros with gastroenterology for elevated LFTs and right upper quadrant pain. He reports that is significantly improved and he is about "90% better". He denies any recurrent atrial flutter. He has occasional palpitations. He is not regularly taking aspirin.  03/14/2020  [name] is seen today in follow-up.  Recently he was seen in February by Kerin Ransom, PA-C for new atrial fibrillation.  His last episode was in 2013.  He was restarted on Eliquis and arrange for cardioversion.  The cardioversion was successful and performed by Dr. Oval Linsey suggesting that he actually had had some flutter.  Today he returns and is feeling much better.  He is in sinus rhythm with a first-degree AV block.  Blood pressure is well controlled.  He is on combination diltiazem and metoprolol and takes twice daily Eliquis.  He does have a history of obstructive sleep apnea but has been compliant with the treatment.  He said he was not able to use it during the year that his wife had cancer however she is now doing better.  He intends to reach out to [facility]ero care to see if he can be fitted with new equipment.  PMHx:  Past Medical History:  Diagnosis Date  . Allergic angioedema   . Allergy   . Hypertension   .  Non-insulin dependent type 2 diabetes mellitus ([place])   . PAF (paroxysmal atrial fibrillation) ([place])   . Pneumonia   . RAD (reactive airway disease)   . Sleep apnea    C-pap intolerant  . Tick bite     Past Surgical History:  Procedure Laterality Date  . APPENDECTOMY    . CARDIOVERSION N/A 02/21/2020   Procedure: CARDIOVERSION;  Surgeon: Skeet Latch, MD;  Location: [facility];  Service: Cardiovascular;  Laterality: N/A;  .  CHOLECYSTECTOMY    . COLONOSCOPY    . stress myoview  12/13/2008   EF 61% LV  normal  . TONSILLECTOMY      FAMHx:  Family History  Problem Relation Age of Onset  . Pancreatic cancer Mother   . Hypertension Mother   . Cancer Mother   . Hypertension Sister   . Asthma Son   . Stroke Father   . Hypertension Father   . Hypertension Brother   . Hypertension Daughter   . Cancer Maternal Grandfather   . Colon polyps Neg Hx   . Esophageal cancer Neg Hx   . Rectal cancer Neg Hx   . Stomach cancer Neg Hx     SOCHx:   reports that he quit smoking about 23 years ago. His smoking use included cigarettes and pipe. He has quit using smokeless tobacco.  His smokeless tobacco use included chew. He reports that he does not drink alcohol or use drugs.  ALLERGIES:  Allergies  Allergen Reactions  . Ibuprofen Swelling  . Lisinopril Swelling    Swelling of face and lips   . Oxaprozin Swelling    Swelling of face and lips  . Ziac [Bisoprolol-Hydrochlorothiazide] Swelling    Per patient records from Dr. Florina Ou.  Thayer Jew Hcl] Itching  . Hctz [Hydrochlorothiazide] Swelling  . Norvasc [Amlodipine Besylate]     Unknown     ROS: Pertinent items noted in HPI and remainder of comprehensive ROS otherwise negative.  HOME MEDS: Current Outpatient Medications  Medication Sig Dispense Refill  . apixaban (ELIQUIS) 5 MG TABS tablet Take 1 tablet (5 mg total) by mouth 2 (two) times daily. 60 tablet 6  . cetirizine (ZYRTEC) 10 MG tablet Take 10 mg by mouth daily.    . chlorthalidone (HYGROTON) 25 MG tablet TAKE 1 TABLET BY MOUTH EVERY DAY (Patient taking differently: Take 25 mg by mouth daily. ) 90 tablet 1  . diltiazem (CARDIZEM CD) 300 MG 24 hr capsule TAKE ONE CAPSULE BY MOUTH EVERY DAY (Patient taking differently: Take 300 mg by mouth daily. ) 90 capsule 3  . metFORMIN (GLUCOPHAGE-XR) 500 MG 24 hr tablet TAKE 2 TABLETS BY MOUTH TWICE A DAY (Patient taking differently: Take 1,000  mg by mouth in the morning and at bedtime. ) 360 tablet 1  . metoprolol succinate (TOPROL XL) 25 MG 24 hr tablet Take 1 tablet (25 mg total) by mouth daily. 30 tablet 6  . pravastatin (PRAVACHOL) 20 MG tablet TAKE 1 TABLET BY MOUTH EVERY DAY (Patient taking differently: Take 20 mg by mouth daily. ) 90 tablet 3   No current facility-administered medications for this visit.    LABS/IMAGING: No results found for this or any previous visit (from the past 48 hour(s)). No results found.  VITALS: BP 118/68   Pulse 70   Temp (!) 97.2 F (36.2 C)   Ht 6\' 2"  (1.88 m)   Wt 258 lb 6.4 oz (117.2 kg)   SpO2 97%   BMI 33.18 kg/m  EXAM: General appearance: alert and no distress Neck: no adenopathy, no carotid bruit, no JVD, supple, symmetrical, trachea midline and thyroid not enlarged, symmetric, no tenderness/mass/nodules Lungs: clear to auscultation bilaterally Heart: regular rate and rhythm, S1, S2 normal, no murmur, click, rub or gallop Abdomen: soft, non-tender; bowel sounds normal; no masses,  no organomegaly Extremities: edema trace edema Pulses: 2+ and symmetric Skin: Skin color, texture, turgor normal. No rashes or lesions Neurologic: Grossly normal  EKG: Sinus rhythm first-degree AV block at 66-personally reviewed  ASSESSMENT: 1. Hypertension - controlled 2. Paroxysmal atrial flutter on Eliquis, CHADSVASC score of 2 3. OSA-noncompliant with CPAP  PLAN: 1.   Mr. Mongiello is doing well now back in sinus rhythm.  He is tolerating Eliquis which will be lifelong.  His blood pressure is well controlled.  He does have a history of OSA but has not been compliant with his CPAP but intends to reach out to his DME provider to restart therapy.  Plan follow-up with me annually or sooner as necessary.  Pixie Casino, MD, [facility], [place] Director of the Advanced Lipid Disorders &  Cardiovascular Risk Reduction Clinic Diplomate of the American Board of  Clinical Lipidology Attending Cardiologist  Direct Dial: [phone]  Fax: [phone]  Website:  [email or web address] Osgood Sherry Blackard 03/14/2020, 8:55 AM

## 2020-03-14 NOTE — Patient Instructions (Signed)

## 2020-03-22 ENCOUNTER — Telehealth: Payer: Medicare HMO

## 2020-03-29 DIAGNOSIS — R69 Illness, unspecified: Secondary | ICD-10-CM | POA: Diagnosis not present

## 2020-04-04 ENCOUNTER — Other Ambulatory Visit: Payer: Self-pay | Admitting: Family Medicine

## 2020-04-04 DIAGNOSIS — E785 Hyperlipidemia, unspecified: Secondary | ICD-10-CM

## 2020-04-04 DIAGNOSIS — I10 Essential (primary) hypertension: Secondary | ICD-10-CM

## 2020-04-16 ENCOUNTER — Other Ambulatory Visit: Payer: Self-pay | Admitting: Family Medicine

## 2020-04-16 DIAGNOSIS — I10 Essential (primary) hypertension: Secondary | ICD-10-CM

## 2020-04-19 DIAGNOSIS — R69 Illness, unspecified: Secondary | ICD-10-CM | POA: Diagnosis not present

## 2020-05-03 DIAGNOSIS — R69 Illness, unspecified: Secondary | ICD-10-CM | POA: Diagnosis not present

## 2020-07-21 ENCOUNTER — Other Ambulatory Visit: Payer: Self-pay | Admitting: Family Medicine

## 2020-07-21 DIAGNOSIS — R739 Hyperglycemia, unspecified: Secondary | ICD-10-CM

## 2020-07-24 ENCOUNTER — Other Ambulatory Visit: Payer: Self-pay | Admitting: Cardiology

## 2020-08-07 ENCOUNTER — Ambulatory Visit: Payer: Medicare HMO | Attending: Internal Medicine

## 2020-08-07 DIAGNOSIS — Z23 Encounter for immunization: Secondary | ICD-10-CM

## 2020-08-07 NOTE — Progress Notes (Signed)
   Covid-19 Vaccination Clinic  Name:  DAQUAWN SEELMAN    MRN: [id number] DOB: [birth year]  08/07/2020  Mr. Conroy was observed post Covid-19 immunization for 15 minutes without incident. He was provided with Vaccine Information Sheet and instruction to access the V-Safe system.   Mr. Kessner was instructed to call 911 with any severe reactions post vaccine: Marland Kitchen Difficulty breathing  . Swelling of face and throat  . A fast heartbeat  . A bad rash all over body  . Dizziness and weakness

## 2020-08-09 ENCOUNTER — Telehealth: Payer: Self-pay

## 2020-08-09 ENCOUNTER — Encounter: Payer: Self-pay | Admitting: Family Medicine

## 2020-08-09 NOTE — Telephone Encounter (Signed)
Spoke with patient regarding symptoms.  Patient states he is "not feeling 100%, but much feels better".  Denies fever at this time, alternating Tylenol and Ibuprofen.  Advised to call for any concerns/questions.    [facility] Night - Client TELEPHONE ADVICE RECORD AccessNurse Patient Name: [name] Gender: Unknown DOB: 10/04/1946 Age: 74 Y 65 M 2 D Return Phone Number: [phone] (Primary), [phone] (Secondary) Address[place] [address] Client [facility] Night - Client Client Site [facility] - Night Physician Copland, Janett Billow- MD Contact Type Call Who Is Calling Patient / Member / Family / Caregiver Call Type Triage / Clinical Caller Name [name] Redd Relationship To Patient Spouse Return Phone Number [phone] (Primary) Chief Complaint Headache Reason for Call Symptomatic / Request for [place] states her husband got the [place] booster yesterday and has been sick. He have a fever.He has musle aches. He has headaches. His fever 101.5 Translation No Nurse Assessment Nurse: Laqueta Due, RN, Metallurgist (Eastern Time): 08/08/2020 5:36:10 PM Confirm and document reason for call. If symptomatic, describe symptoms. ---got the Moderna booster yesterday and has been sick. has muscle aches, headaches. His fever 101.5 now and feels better now. Has the patient had close contact with a person known or suspected to have the novel coronavirus illness OR traveled / lives in area with major community spread (including international travel) in the last 14 days from the onset of symptoms? * If Asymptomatic, screen for exposure and travel within the last 14 days. ---No Does the patient have any new or worsening symptoms? ---Yes Will a triage be completed? ---Yes Related visit to physician within the last 2 weeks? ---N/A Does the PT have any chronic  conditions? (i.e. diabetes, asthma, this includes High risk factors for pregnancy, etc.) ---Unknown Is this a behavioral health or substance abuse call? ---No Guidelines Guideline Title Affirmed Question Affirmed Notes Nurse Date/Time (Eastern Time) COVID-19 - Vaccine Questions and Reactions COVID-19 vaccine, systemic reactions (e.g., fatigue, fever, muscle aches), questions about Laqueta Due, RN, [employer] 08/08/2020 5:36:07 PM Disp. Time Eilene Ghazi Time) Disposition Final User PLEASE NOTE: All timestamps contained within this report are represented as [place] Standard Time. CONFIDENTIALTY NOTICE: This fax transmission is intended only for the addressee. It contains information that is legally privileged, confidential or otherwise protected from use or disclosure. If you are not the intended recipient, you are strictly prohibited from reviewing, disclosing, copying using or disseminating any of this information or taking any action in reliance on or regarding this information. If you have received this fax in error, please notify us immediately by telephone so that we can arrange for its return to [place]. Phone: [phone], Toll-Free: [phone], Fax: [phone] Page: 2 of 2 Call Id: [id number] 08/08/2020 5:49:03 PM [place], RN, Agricultural consultant Disagree/Comply Comply Caller Understands Yes PreDisposition Did not know what to do Care Advice Given Per Guideline HOME CARE: * You should be able to treat this at home. COVID-19 VACCINE - COMMON REACTIONS: * Local pain, redness, or swelling at injection site * Feeling tired (fatigue) * Fever and chills * Headache * Muscle aches or joint pains * Symptoms usually last 1 to 2 days. * Put a heat pack or warm wet washcloth on the vaccine shot area for 10 to 20 minutes. * Repeat as needed for the first 48 hours after the injection. * Caution: Burn. Do not sleep on a heating pad. PAIN AND FEVER MEDICINES: *  For pain or fever relief, take either  acetaminophen or ibuprofen. * Treat fevers above 101 F (38.3 C). The goal of fever therapy is to bring the fever down to a comfortable level. Remember that fever medicine usually lowers fever 2 degrees F (1 - 1 1/2 degrees C). * IBUPROFEN (E.G., MOTRIN, ADVIL): Take 400 mg (two 200 mg pills) by mouth every 6 hours. The most you should take each day is 1,200 mg (six 200 mg pills), unless your doctor has told you to take more. * ACETAMINOPHEN REGULAR STRENGTH TYLENOL: Take 650 mg (two 325 mg pills) by mouth every 4-6 hours as needed. Each Regular Strength Tylenol pill has 325 mg of acetaminophen. The most you should take each day is 3,250 mg (10 pills a day). * ACETAMINOPHEN - EXTRA STRENGTH TYLENOL: Take 1,000 mg (two 500 mg pills) every 8 hours as needed. Each Extra Strength Tylenol pill has 500 mg of acetaminophen. The most you should take each day is 3,000 mg (6 pills a day). CALL BACK IF: * You become worse. CARE ADVICE given per COVID-19 - Vaccine Questions and Reactions (Adult) guideline.

## 2020-08-23 ENCOUNTER — Telehealth: Payer: Self-pay | Admitting: Pharmacist

## 2020-08-23 NOTE — Progress Notes (Addendum)
Chronic Care Management Pharmacy Assistant   Name: [name]  MRN: [id number] DOB: Jul 05, 1946  Reason for Encounter: Disease State  Patient Questions:  1.  Have you seen any other providers since your last visit? Yes  2.  Any changes in your medicines or health? No    PCP : Copland, Gay Filler, MD   AFib, DM, HTN, Low HDL, Allergic Rhinitis  Office Visits: 08-07-2020 (Immunization) Patient received Moderna Covid vaccination.    Consults: 03-14-2020 (Cardiology) Patient presented in the office for follow up atrial flutter. Patient reports feeling much better after successful cardioversion on 02-21-2020. Note indicate  He is in sinus rhythm with a first-degree AV block.  Blood pressure is well controlled.  He is on combination diltiazem and metoprolol and takes twice daily Eliquis. He also ha s a hx of OSA but has not been compliant with his CPAP. Patient states he intends to reach out to DME provider to restart therapy. Lifelong Eliquis   Allergies:   Allergies  Allergen Reactions  . Ibuprofen Swelling  . Lisinopril Swelling    Swelling of face and lips   . Oxaprozin Swelling    Swelling of face and lips  . Ziac [Bisoprolol-Hydrochlorothiazide] Swelling    Per patient records from Dr. Florina Ou.  Thayer Jew Hcl] Itching  . Hctz [Hydrochlorothiazide] Swelling  . Norvasc [Amlodipine Besylate]     Unknown     Medications: Outpatient Encounter Medications as of 08/23/2020  Medication Sig  . apixaban (ELIQUIS) 5 MG TABS tablet Take 1 tablet (5 mg total) by mouth 2 (two) times daily.  . cetirizine (ZYRTEC) 10 MG tablet Take 10 mg by mouth daily.  . chlorthalidone (HYGROTON) 25 MG tablet TAKE 1 TABLET BY MOUTH EVERY DAY  . diltiazem (CARDIZEM CD) 300 MG 24 hr capsule TAKE 1 CAPSULE BY MOUTH EVERY DAY  . metFORMIN (GLUCOPHAGE-XR) 500 MG 24 hr tablet TAKE 2 TABLETS BY MOUTH TWICE A DAY  . metoprolol succinate (TOPROL-XL) 25 MG 24 hr tablet TAKE 1 TABLET BY  MOUTH EVERY DAY  . pravastatin (PRAVACHOL) 20 MG tablet TAKE 1 TABLET BY MOUTH EVERY DAY   No facility-administered encounter medications on file as of 08/23/2020.    Current Diagnosis: Patient Active Problem List   Diagnosis Date Noted  . Typical atrial flutter ([place])   . Anticoagulated 02/15/2020  . Anticoagulated 02/15/2020  . Sleep apnea 01/24/2020  . Controlled type 2 diabetes mellitus without complication, without long-term current use of insulin ([place]) 04/02/2017  . Left hip pain 02/25/2016  . CMV infection, acute ([place]) 09/11/2015  . Low HDL (under 40) 07/12/2013  . History of angioedema 11/08/2012  . Paroxysmal atrial flutter ([place]) 02/04/2012  . Essential hypertension 06/15/2009  . BRONCHOSPASM 06/14/2009    Goals Addressed   None    Reviewed chart prior to disease state call. Spoke with patient regarding BP  Recent Office Vitals: BP Readings from Last 3 Encounters:  03/14/20 118/68  02/21/20 114/68  02/15/20 131/69   Pulse Readings from Last 3 Encounters:  03/14/20 70  02/21/20 66  02/15/20 (!) 120    Wt Readings from Last 3 Encounters:  03/14/20 258 lb 6.4 oz (117.2 kg)  02/21/20 250 lb (113.4 kg)  02/15/20 250 lb (113.4 kg)     Kidney Function Lab Results  Component Value Date/Time   CREATININE 1.04 02/20/2020 08:59 AM   CREATININE 1.30 (H) 01/24/2020 12:14 PM   CREATININE 0.98 03/20/2016 08:42 AM   CREATININE  1.09 12/07/2015 11:05 AM   GFR 63.32 11/14/2019 12:01 PM   GFRNONAA 72 02/20/2020 08:59 AM   GFRNONAA 79 03/20/2016 08:42 AM   GFRAA 83 02/20/2020 08:59 AM   GFRAA >89 03/20/2016 08:42 AM    BMP Latest Ref Rng & Units 02/20/2020 01/24/2020 11/14/2019  Glucose 65 - 99 mg/dL 194(H) 160(H) 146(H)  BUN 8 - 27 mg/dL 18 22 20   Creatinine 0.76 - 1.27 mg/dL 1.04 1.30(H) 1.14  BUN/Creat Ratio 10 - 24 17 17  -  Sodium 134 - 144 mmol/L 136 138 138  Potassium 3.5 - 5.2 mmol/L 4.4 4.0 4.4  Chloride 96 - 106 mmol/L 95(L) 98 99  CO2 20 - 29 mmol/L 26 21 31    Calcium 8.6 - 10.2 mg/dL 9.3 9.9 9.6    . Current antihypertensive regimen:  o chlorthalidone 25mg  daily  o metoprolol succinate 50mg  daily o diltiazem 300mg  every day  . How often are you checking your Blood Pressure? infrequently . Current home BP readings: Patient states he may check his blood pressure every three weeks or so. The last reading he can recall was130/80. Marland Kitchen What recent interventions/DTPs have been made by any provider to improve Blood Pressure control since last CPP Visit: none . Any recent hospitalizations or ED visits since last visit with CPP? No . What diet changes have been made to improve Blood Pressure Control?  o Patient states he usually has a protien smoothie for breakfast . What exercise is being done to improve your Blood Pressure Control?  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 Patient stated he is extremely busy at work and does not know when he will have time to make an appointment with the clinical pharmacist. Reports being compliant with blood pressure medication. He does not have any concerns or issues at this time.  Adherence Review: Is the patient currently on ACE/ARB medication? No Does the patient have >5 day gap between last estimated fill dates? Yes   Fill Dates Per Dispense Report - Filled Appropriately except chlorthalidone o chlorthalidone 25mg  daily: 04/16/20 90DS o metoprolol succinate 50mg  daily: 07/25/20 90DS, 04/20/20 90DS o diltiazem 300mg  every day: 07/08/20 90DS, 04/05/20 90DS   Follow-Up:  Pharmacist Review   Fanny Skates, [place] Pharmacist Assistant [phone]  Reviewed by: De Blanch, PharmD Clinical Pharmacist [facility] [phone]

## 2020-09-03 ENCOUNTER — Other Ambulatory Visit: Payer: Self-pay | Admitting: Cardiology

## 2020-09-03 NOTE — Telephone Encounter (Signed)
Refill Request.  

## 2020-09-24 ENCOUNTER — Telehealth: Payer: Self-pay | Admitting: Pharmacist

## 2020-09-24 NOTE — Progress Notes (Addendum)
Chronic Care Management Pharmacy Assistant   Name: [name]  MRN: [id number] DOB: 12-11-1946  Reason for Encounter: Disease State  Patient Questions:  1.  Have you seen any other providers since your last visit? No  2.  Any changes in your medicines or health? No     PCP : Copland, Gay Filler, MD   Their chronic conditions include: AFib, DM, HTN, Low HDL, Allergic Rhinitis  Office Visits: None since their last [place] call with the clinical pharmacist assistant on 08-23-2020.  Consults: None since their last CCM Disease State call with the clinical pharmacist assistant on 08-23-2020.  Allergies:   Allergies  Allergen Reactions  . Ibuprofen Swelling  . Lisinopril Swelling    Swelling of face and lips   . Oxaprozin Swelling    Swelling of face and lips  . Ziac [Bisoprolol-Hydrochlorothiazide] Swelling    Per patient records from Dr. Florina Ou.  Thayer Jew Hcl] Itching  . Hctz [Hydrochlorothiazide] Swelling  . Norvasc [Amlodipine Besylate]     Unknown     Medications: Outpatient Encounter Medications as of 09/24/2020  Medication Sig  . cetirizine (ZYRTEC) 10 MG tablet Take 10 mg by mouth daily.  . chlorthalidone (HYGROTON) 25 MG tablet TAKE 1 TABLET BY MOUTH EVERY DAY  . diltiazem (CARDIZEM CD) 300 MG 24 hr capsule TAKE 1 CAPSULE BY MOUTH EVERY DAY  . ELIQUIS 5 MG TABS tablet TAKE 1 TABLET BY MOUTH TWICE A DAY  . metFORMIN (GLUCOPHAGE-XR) 500 MG 24 hr tablet TAKE 2 TABLETS BY MOUTH TWICE A DAY  . metoprolol succinate (TOPROL-XL) 25 MG 24 hr tablet TAKE 1 TABLET BY MOUTH EVERY DAY  . pravastatin (PRAVACHOL) 20 MG tablet TAKE 1 TABLET BY MOUTH EVERY DAY   No facility-administered encounter medications on file as of 09/24/2020.    Current Diagnosis: Patient Active Problem List   Diagnosis Date Noted  . Typical atrial flutter ([place])   . Anticoagulated 02/15/2020  . Anticoagulated 02/15/2020  . Sleep apnea 01/24/2020  . Controlled type  2 diabetes mellitus without complication, without long-term current use of insulin ([place]) 04/02/2017  . Left hip pain 02/25/2016  . CMV infection, acute ([place]) 09/11/2015  . Low HDL (under 40) 07/12/2013  . History of angioedema 11/08/2012  . Paroxysmal atrial flutter ([place]) 02/04/2012  . Essential hypertension 06/15/2009  . BRONCHOSPASM 06/14/2009    Goals Addressed   None    Reviewed chart prior to disease state call. Spoke with patient regarding BP  Recent Office Vitals: BP Readings from Last 3 Encounters:  03/14/20 118/68  02/21/20 114/68  02/15/20 131/69   Pulse Readings from Last 3 Encounters:  03/14/20 70  02/21/20 66  02/15/20 (!) 120    Wt Readings from Last 3 Encounters:  03/14/20 258 lb 6.4 oz (117.2 kg)  02/21/20 250 lb (113.4 kg)  02/15/20 250 lb (113.4 kg)     Kidney Function Lab Results  Component Value Date/Time   CREATININE 1.04 02/20/2020 08:59 AM   CREATININE 1.30 (H) 01/24/2020 12:14 PM   CREATININE 0.98 03/20/2016 08:42 AM   CREATININE 1.09 12/07/2015 11:05 AM   GFR 63.32 11/14/2019 12:01 PM   GFRNONAA 72 02/20/2020 08:59 AM   GFRNONAA 79 03/20/2016 08:42 AM   GFRAA 83 02/20/2020 08:59 AM   GFRAA >89 03/20/2016 08:42 AM    BMP Latest Ref Rng & Units 02/20/2020 01/24/2020 11/14/2019  Glucose 65 - 99 mg/dL 194(H) 160(H) 146(H)  BUN 8 - 27  mg/dL 18 22 20   Creatinine 0.76 - 1.27 mg/dL 1.04 1.30(H) 1.14  BUN/Creat Ratio 10 - 24 17 17  -  Sodium 134 - 144 mmol/L 136 138 138  Potassium 3.5 - 5.2 mmol/L 4.4 4.0 4.4  Chloride 96 - 106 mmol/L 95(L) 98 99  CO2 20 - 29 mmol/L 26 21 31   Calcium 8.6 - 10.2 mg/dL 9.3 9.9 9.6    . Current antihypertensive regimen:  o chlorthalidone 25mg  daily o  metoprolol succinate 50 daily o diltiazem 300mg  every day  . How often are you checking your Blood Pressure? when feeling symptomatic   . Current home BP readings: Patient was not home to provide home BP readings, but said it has been in normal range.  . What  recent interventions/DTPs have been made by any provider to improve Blood Pressure control since last CPP Visit: None  . Any recent hospitalizations or ED visits since last visit with CPP? No   . What diet changes have been made to improve Blood Pressure Control?  o None . What exercise is being done to improve your Blood Pressure Control?  o None  Patient reports taking his chlorthalidone 25 mg daily. He cannot recall when he picked this medication up last [facility]. He stated he would check the date when he was home, but insists he has been taking it as directed.  Adherence Review: Is the patient currently on ACE/ARB medication? No Does the patient have >5 day gap between last estimated fill dates? Yes (chlorthalidone)  Follow-Up:  Scheduled Follow-Up With Clinical Pharmacist for Wednesday November 17th at [place], [place] Pharmacist Assistant [phone]  Reviewed by: De Blanch, PharmD Clinical Pharmacist [facility] [phone]

## 2020-10-16 ENCOUNTER — Encounter: Payer: Self-pay | Admitting: Family Medicine

## 2020-10-30 NOTE — Chronic Care Management (AMB) (Signed)
Chronic Care Management Pharmacy  Name: [name]  MRN: [id number] DOB: Oct 18, 1946  Chief Complaint/ HPI  [name],  74 y.o. , male presents for their Follow-Up CCM visit with the clinical pharmacist via telephone due to COVID-19 Pandemic.  PCP : Darreld Mclean, MD  Their chronic conditions include: AFib, DM, HTN, Low HDL, Allergic Rhinitis  Office Visits: None since last CCM visit on 03/08/20.   Consult Visit: 03-14-2020 (Cardiology) Patient presented in the office for follow up atrial flutter. Patient reports feeling much better after successful cardioversion on 02-21-2020. Note indicate He is in sinus rhythm with a first-degree AV block. Blood pressure is well controlled. He is on combination diltiazem and metoprolol and takes twice daily Eliquis. He also ha s a hx of OSA but has not been compliant with his CPAP. Patient states he intends to reach out to DME provider to restart therapy. Lifelong Eliquis   Medications: Outpatient Encounter Medications as of 10/31/2020  Medication Sig  . cetirizine (ZYRTEC) 10 MG tablet Take 10 mg by mouth daily.  . chlorthalidone (HYGROTON) 25 MG tablet TAKE 1 TABLET BY MOUTH EVERY [name]  . diltiazem (CARDIZEM CD) 300 MG 24 hr capsule TAKE 1 CAPSULE BY MOUTH EVERY [name]  . ELIQUIS 5 MG TABS tablet TAKE 1 TABLET BY MOUTH TWICE A [name]  . metFORMIN (GLUCOPHAGE-XR) 500 MG 24 hr tablet TAKE 2 TABLETS BY MOUTH TWICE A [name]  . metoprolol succinate (TOPROL-XL) 25 MG 24 hr tablet TAKE 1 TABLET BY MOUTH EVERY [name]  . pravastatin (PRAVACHOL) 20 MG tablet TAKE 1 TABLET BY MOUTH EVERY [name]   No facility-administered encounter medications on file as of 10/31/2020.   SDOH Screenings   Alcohol Screen:   . Last Alcohol Screening Score (AUDIT): Not on file  Depression (PHQ2-9):   . PHQ-2 Score: Not on file  Financial Resource Strain: Low Risk   . Difficulty of Paying Living Expenses: Not hard at all  Food Insecurity:   . Worried About Paediatric nurse in the Last Year: Not on file  . Ran Out of Food in the Last Year: Not on file  Housing:   . Last Housing Risk Score: Not on file  Physical Activity:   . Days of Exercise per Week: Not on file  . Minutes of Exercise per Session: Not on file  Social Connections:   . Frequency of Communication with Friends and Family: Not on file  . Frequency of Social Gatherings with Friends and Family: Not on file  . Attends Religious Services: Not on file  . Active Member of Clubs or Organizations: Not on file  . Attends Archivist Meetings: Not on file  . Marital Status: Not on file  Stress:   . Feeling of Stress : Not on file  Tobacco Use: Medium Risk  . Smoking Tobacco Use: Former Smoker  . Smokeless Tobacco Use: Former Soil scientist Needs:   . Film/video editor (Medical): Not on file  . Lack of Transportation (Non-Medical): Not on file    Current Diagnosis/Assessment:  Goals Addressed            This Visit's Progress   . Chronic Care Management Pharmacy Care Plan       CARE PLAN ENTRY (see longitudinal plan of care for additional care plan information)  Current Barriers:  . Chronic Disease Management support, education, and care coordination needs related to AFib, Diabetes, Hypertension, Low HDL, Allergic Rhinitis   Hypertension  BP Readings from Last 3 Encounters:  03/14/20 118/68  02/21/20 114/68  02/15/20 131/69   . Pharmacist Clinical Goal(s): o Over the next 180 days, patient will work with PharmD and providers to maintain BP goal <140/90 . Current regimen:   Chlorthalidone 25mg  daily   Metoprolol succinate 50 daily  Diltiazem 300mg  every [name] . Interventions: o Discussed BP goal  . Patient self care activities - Over the next 180 days, patient will: o Maintain hypertension medication regimen.   Hyperlipidemia Lab Results  Component Value Date/Time   LDLCALC 92 05/23/2019 10:04 AM   . Pharmacist Clinical Goal(s): o Over the  next 180 days, patient will work with PharmD and providers to maintain LDL goal < 100 . Current regimen:  o Pravastatin 20mg  daily . Interventions: o Discussed LDL goal o Consider updating lipid panel at next visit with [name] . Patient self care activities - Over the next 180 days, patient will: o Maintain cholesterol medication regimen.  o Update lipid panel at next visit with [name]  Diabetes Lab Results  Component Value Date/Time   HGBA1C 7.2 (H) 11/14/2019 12:01 PM   HGBA1C 6.9 (H) 05/23/2019 10:04 AM   . Pharmacist Clinical Goal(s): o Over the next 180 days, patient will work with PharmD and providers to achieve A1c goal <7% . Current regimen:  o Metformin 500mg  #2 twice daily . Interventions: o Discussed DM Goal o Consider updating a1c at next visit w/ [name] . Patient self care activities - Over the next 180 days, patient will: o Achieve a1c <7% o Update a1c at next visit with [name]  Medication management . Pharmacist Clinical Goal(s): o Over the next 180 days, patient will work with PharmD and providers to achieve optimal medication adherence . [facility] . Interventions o Comprehensive medication review performed. o Continue current medication management strategy . 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   Married. Wife with hx of cancer.   Update 10/31/20 At work right now and they are slammed. He's unable to speak long.  AFIB     CBC Latest Ref Rng & Units 02/20/2020 01/24/2020 09/22/2019  WBC 3.4 - 10.8 x10E3/uL 7.1 9.8 10.0  Hemoglobin 13.0 - 17.7 g/dL 15.5 17.0 16.7  Hematocrit 37.5 - 51.0 % 44.7 47.6 45.6  Platelets 150 - 450 x10E3/uL 315 366 329   Patient is currently rate controlled. HR: 80s BPM per patient  Patient has failed these meds in past: None noted  Patient is currently controlled on the following medications:   Eliquis 5mg  BID  Diltiazem 300mg  daily  Metoprolol 50mg  daily   Patient states he was dx long ago, but only recently had issues with Afib. He wonders what could have caused this.  Mentioned to patient that he seems very busy and stress can contribute to recurrent Afib. He agrees and states he will consider taking a vacation.  Stopped taking metoprolol Tacey Dimaggio after his cardioversion He was  not instructed to stop metoprolol, but figured he did not need it any further He wonders if this was the correct thing to do. He also wonders if he has to continue Eliquis. Will defer to cardio. Patient is scheduled to see Dr. Debara Pickett next week (03/14/20), feel this should be answered prior to appt.  Called Dr. Lysbeth Penner office, explained the above situation, and was informed by Maudie Mercury that she would send the message forward and someone would contact the patient directly. Called patient back to inform him of this. He verbalized understanding.  We discussed:  The reason Eliquis is used in AFib along with the risk/benefit of continuing and discontining Eliquis therapy   Update 10/31/20 Still using metoprolol? Yes States he hasn't had any med changes in year with exception of restarting Eliquis  Plan -Continue current medications    Diabetes   Goal <7%, could consider goal of <7.5%, but patient has limited comorbidities   Recent Relevant Labs: Lab Results  Component Value Date/Time   HGBA1C 7.2 (H) 11/14/2019 12:01 PM   HGBA1C 6.9 (H) 05/23/2019 10:04 AM   MICROALBUR 1.1 05/23/2019 10:04 AM    MICROALBUR 0.5 04/07/2018 11:16 AM    Checking BG: Weekly (2-3 times)  Usually less than 150 in the mornings In the last few weeks... Highest: 180 Lowest: 130  Patient has failed these meds in past: None noted  Patient is currently stable on the following medications:   Metformin 500mg  2 BID  Was going to the gym 5 days a week, but has stopped due to covid   Last diabetic Foot exam: No results found for: HMDIABEYEEXA  Last diabetic Eye exam: No results found for: HMDIABFOOTEX   Update 10/31/20 Pt would benefit from updated a1c  Plan -Continue current medications  -Update a1c at next office visit with [name]  Hypertension   BP goal <140/90  Office blood pressures are  BP Readings from Last 3 Encounters:  03/14/20 118/68  02/21/20 114/68  02/15/20 131/69    Patient has failed these meds in the past: lisinopril (swelling), bisoprolol-hctz (swelling), amlodipine (listed in allergies) Patient is currently controlled on the following medications:   Chlorthalidone 25mg  daily   Metoprolol succinate 50 daily  Diltiazem 300mg  every Jorian [place]  Patient checks BP at home when feeling symptomatic   Patient home BP readings are ranging: 120s-130s/70s-80s HR in the 80s  Update 10/31/20 Last checked about 2-3 weeks ago, can't recall last reading.  Plan -Continue current medications   Lipid Assessment   LDL goal <100  Lipid Panel     Component Value Date/Time   CHOL 146 05/23/2019 1004   TRIG 98.0 05/23/2019 1004   HDL 34.30 (L) 05/23/2019 1004   CHOLHDL 4 05/23/2019 1004   VLDL 19.6 05/23/2019 1004   LDLCALC 92 05/23/2019 1004    The 10-year ASCVD risk score Mikey Bussing DC Jr., et al., 2013) is: 34.7%   Values used to calculate the score:     Age: 55 years     Sex: Male     Is Non-Hispanic African American: No     Diabetic: Yes     Tobacco smoker: No     Systolic Blood Pressure: 009 mmHg     Is BP treated: Yes     HDL Cholesterol: 34.3 mg/dL     Total  Cholesterol: 146 mg/dL   Patient has failed these meds in past: None noted  Patient is currently uncontrolled for HDL, stable on the following medications:   Pravastatin  20mg  daily  We discussed:  HDL goal and indication for statin therapy   Update 10/31/20 Pt would benefit from updated lipid panel  Plan -Continue current medications  -Update lipid panel at next visit with Dr. Sherrell Puller Perla Echavarria, PharmD Clinical Pharmacist [facility] [phone]

## 2020-10-31 ENCOUNTER — Ambulatory Visit: Payer: Medicare HMO | Admitting: Pharmacist

## 2020-10-31 DIAGNOSIS — I10 Essential (primary) hypertension: Secondary | ICD-10-CM

## 2020-10-31 DIAGNOSIS — E785 Hyperlipidemia, unspecified: Secondary | ICD-10-CM

## 2020-10-31 DIAGNOSIS — E119 Type 2 diabetes mellitus without complications: Secondary | ICD-10-CM

## 2020-10-31 NOTE — Patient Instructions (Addendum)
Visit Information  Goals Addressed            This Visit's Progress   . Chronic Care Management Pharmacy Care Plan       CARE PLAN ENTRY (see longitudinal plan of care for additional care plan information)  Current Barriers:  . Chronic Disease Management support, education, and care coordination needs related to AFib, Diabetes, Hypertension, Low HDL, Allergic Rhinitis   Hypertension BP Readings from Last 3 Encounters:  03/14/20 118/68  02/21/20 114/68  02/15/20 131/69   . Pharmacist Clinical Goal(s): o Over the next 180 days, patient will work with PharmD and providers to maintain BP goal <140/90 . Current regimen:   Chlorthalidone 25mg  daily   Metoprolol succinate 50 daily  Diltiazem 300mg  every Anjoli Diemer . Interventions: o Discussed BP goal  . Patient self care activities - Over the next 180 days, patient will: o Maintain hypertension medication regimen.   Hyperlipidemia Lab Results  Component Value Date/Time   LDLCALC 92 05/23/2019 10:04 AM   . Pharmacist Clinical Goal(s): o Over the next 180 days, patient will work with PharmD and providers to maintain LDL goal < 100 . Current regimen:  o Pravastatin 20mg  daily . Interventions: o Discussed LDL goal o Consider updating lipid panel at next visit with Dr. Lorelei Pont . Patient self care activities - Over the next 180 days, patient will: o Maintain cholesterol medication regimen.  o Update lipid panel at next visit with Dr. Lorelei Pont  Diabetes Lab Results  Component Value Date/Time   HGBA1C 7.2 (H) 11/14/2019 12:01 PM   HGBA1C 6.9 (H) 05/23/2019 10:04 AM   . Pharmacist Clinical Goal(s): o Over the next 180 days, patient will work with PharmD and providers to achieve A1c goal <7% . Current regimen:  o Metformin 500mg  #2 twice daily . Interventions: o Discussed DM Goal o Consider updating a1c at next visit w/ Dr. Lorelei Pont . Patient self care activities - Over the next 180 days, patient will: o Achieve a1c  <7% o Update a1c at next visit with Dr. Lorelei Pont  Medication management . Pharmacist Clinical Goal(s): o Over the next 180 days, patient will work with PharmD and providers to achieve optimal medication adherence . [facility] . Interventions o Comprehensive medication review performed. o Continue current medication management strategy . Patient self care activities - Over the next 180 days, patient will: o Focus on medication adherence by filling and taking medications appropriately  o Take medications as prescribed o Report any questions or concerns to PharmD and/or provider(s)  Please see past updates related to this goal by clicking on the "Past Updates" button in the selected goal        The patient verbalized understanding of instructions, educational materials, and care plan provided today and agreed to receive a mailed copy of patient instructions, educational materials, and care plan.   Telephone follow up appointment with pharmacy team member scheduled for: 04/30/2021  Melvenia Beam Ceyda Peterka, PharmD Clinical Pharmacist [facility] [phone]

## 2020-12-05 ENCOUNTER — Other Ambulatory Visit: Payer: Self-pay | Admitting: Family Medicine

## 2020-12-05 DIAGNOSIS — I10 Essential (primary) hypertension: Secondary | ICD-10-CM

## 2020-12-12 ENCOUNTER — Telehealth: Payer: Self-pay | Admitting: Family Medicine

## 2020-12-12 NOTE — Telephone Encounter (Signed)
Called pt back- he tested positive for covid 19 today He is vaccinated and boosted He notes a HA but overall is ok He does not have a sat meter- I advised him to get one if he can, watch for any reading less than 90% Try tylenol for HA He will let me know if I can help in any way

## 2020-12-12 NOTE — Telephone Encounter (Signed)
Caller : Monty  Call Back # [phone]  Patient states is covid 19 positive, patient fully vaccinated and exerpericing a headache.   Patient would like to know what to do and recommendation from provider  Please advise

## 2020-12-17 ENCOUNTER — Encounter: Payer: Self-pay | Admitting: Family Medicine

## 2020-12-17 MED ORDER — DOXYCYCLINE HYCLATE 100 MG PO CAPS: 100.0000 mg | ORAL_CAPSULE | Freq: Two times a day (BID) | ORAL | 0 refills | Status: DC

## 2020-12-17 NOTE — Telephone Encounter (Signed)
Pt called back today and stated he feels like he has "a head cold" and tightness in chest and coughing up something.  He is wanting to know if you can prescribe something for him to help him out.  He is now on the 6th day of Covid. He stated the Z-pack does not work well for him.  Pt uses [facility] [address]

## 2020-12-17 NOTE — Addendum Note (Signed)
Addended by: Abbe Amsterdam C on: 12/17/2020 12:07 PM   Modules accepted: Orders

## 2021-01-02 DIAGNOSIS — L821 Other seborrheic keratosis: Secondary | ICD-10-CM | POA: Diagnosis not present

## 2021-01-02 DIAGNOSIS — D235 Other benign neoplasm of skin of trunk: Secondary | ICD-10-CM | POA: Diagnosis not present

## 2021-01-02 DIAGNOSIS — D1801 Hemangioma of skin and subcutaneous tissue: Secondary | ICD-10-CM | POA: Diagnosis not present

## 2021-01-02 DIAGNOSIS — Z85828 Personal history of other malignant neoplasm of skin: Secondary | ICD-10-CM | POA: Diagnosis not present

## 2021-01-02 DIAGNOSIS — L570 Actinic keratosis: Secondary | ICD-10-CM | POA: Diagnosis not present

## 2021-01-17 ENCOUNTER — Telehealth: Payer: Self-pay | Admitting: Pharmacist

## 2021-01-17 NOTE — Progress Notes (Addendum)
Chronic Care Management Pharmacy Assistant   Name: [name]  MRN: [id number] DOB: [birth year]  Reason for Encounter: Disease State For HTN  Patient Questions:  1.  Have you seen any other providers since your last visit? Yes.   2.  Any changes in your medicines or health? Yes.    PCP : Darreld Mclean, MD   Their chronic conditions include: AFib, DM, HTN, Low HDL, Allergic Rhinitis.  Office Visits:  12/12/20 Telephone Dr. Lorelei Pont. Per note patient tested positive for COVID-19. STARTED doxycyline Hyclate 100 mg 2 times daily for 10 days.  Consults: None since 10/31/20  Allergies:   Allergies  Allergen Reactions   Ibuprofen Swelling   Lisinopril Swelling    Swelling of face and lips    Oxaprozin Swelling    Swelling of face and lips   Ziac [Bisoprolol-Hydrochlorothiazide] Swelling    Per patient records from Dr. Florina Ou.   Bystolic [Nebivolol Hcl] Itching   Hctz [Hydrochlorothiazide] Swelling   Norvasc [Amlodipine Besylate]     Unknown     Medications: Outpatient Encounter Medications as of 01/17/2021  Medication Sig   cetirizine (ZYRTEC) 10 MG tablet Take 10 mg by mouth daily.   chlorthalidone (HYGROTON) 25 MG tablet TAKE 1 TABLET BY MOUTH EVERY DAY   diltiazem (CARDIZEM CD) 300 MG 24 hr capsule TAKE 1 CAPSULE BY MOUTH EVERY DAY   doxycycline (VIBRAMYCIN) 100 MG capsule Take 1 capsule (100 mg total) by mouth 2 (two) times daily.   ELIQUIS 5 MG TABS tablet TAKE 1 TABLET BY MOUTH TWICE A DAY   metFORMIN (GLUCOPHAGE-XR) 500 MG 24 hr tablet TAKE 2 TABLETS BY MOUTH TWICE A DAY   metoprolol succinate (TOPROL-XL) 25 MG 24 hr tablet TAKE 1 TABLET BY MOUTH EVERY DAY   pravastatin (PRAVACHOL) 20 MG tablet TAKE 1 TABLET BY MOUTH EVERY DAY   No facility-administered encounter medications on file as of 01/17/2021.    Current Diagnosis: Patient Active Problem List   Diagnosis Date Noted   Typical atrial flutter ([place])    Anticoagulated 02/15/2020    Anticoagulated 02/15/2020   Sleep apnea 01/24/2020   Controlled type 2 diabetes mellitus without complication, without long-term current use of insulin ([place]) 04/02/2017   Left hip pain 02/25/2016   CMV infection, acute ([place]) 09/11/2015   Low HDL (under 40) 07/12/2013   History of angioedema 11/08/2012   Paroxysmal atrial flutter ([place]) 02/04/2012   Essential hypertension 06/15/2009   BRONCHOSPASM 06/14/2009    Goals Addressed   None    Reviewed chart prior to disease state call. Spoke with patient regarding BP  Recent Office Vitals: BP Readings from Last 3 Encounters:  03/14/20 118/68  02/21/20 114/68  02/15/20 131/69   Pulse Readings from Last 3 Encounters:  03/14/20 70  02/21/20 66  02/15/20 (!) 120    Wt Readings from Last 3 Encounters:  03/14/20 258 lb 6.4 oz (117.2 kg)  02/21/20 250 lb (113.4 kg)  02/15/20 250 lb (113.4 kg)     Kidney Function Lab Results  Component Value Date/Time   CREATININE 1.04 02/20/2020 08:59 AM   CREATININE 1.30 (H) 01/24/2020 12:14 PM   CREATININE 0.98 03/20/2016 08:42 AM   CREATININE 1.09 12/07/2015 11:05 AM   GFR 63.32 11/14/2019 12:01 PM   GFRNONAA 72 02/20/2020 08:59 AM   GFRNONAA 79 03/20/2016 08:42 AM   GFRAA 83 02/20/2020 08:59 AM   GFRAA >89 03/20/2016 08:42 AM    BMP Latest Ref Rng & Units  02/20/2020 01/24/2020 11/14/2019  Glucose 65 - 99 mg/dL 194(H) 160(H) 146(H)  BUN 8 - 27 mg/dL 18 22 20   Creatinine 0.76 - 1.27 mg/dL 1.04 1.30(H) 1.14  BUN/Creat Ratio 10 - 24 17 17  -  Sodium 134 - 144 mmol/L 136 138 138  Potassium 3.5 - 5.2 mmol/L 4.4 4.0 4.4  Chloride 96 - 106 mmol/L 95(L) 98 99  CO2 20 - 29 mmol/L 26 21 31   Calcium 8.6 - 10.2 mg/dL 9.3 9.9 9.6    Current antihypertensive regimen:  Chlorthalidone 25 mg daily  Metoprolol succinate 50 mg daily Diltiazem 300 mg every day   What recent interventions/DTPs have been made by any provider to improve Blood Pressure control since last CPP Visit: None.  Any recent  hospitalizations or ED visits since last visit with CPP? No.  Adherence Review: Is the patient currently on ACE/ARB medication? No.  Does the patient have >5 day gap between last estimated fill dates? No  Patient voiced that he did not want to be in the CCM program. I explained the benefits of being in the program and he still did not want to be in the program so I was unable to get updated information about the patients health.    Follow-Up:  Pharmacist Review   Charlann Lange, RMA Clinical Pharmacist Assistant [phone]  5 minutes spent in review, coordination, and documentation.  Reviewed by: Beverly Milch, PharmD Clinical Pharmacist [facility] [phone]

## 2021-02-20 ENCOUNTER — Other Ambulatory Visit: Payer: Self-pay | Admitting: Family Medicine

## 2021-02-20 DIAGNOSIS — R739 Hyperglycemia, unspecified: Secondary | ICD-10-CM

## 2021-03-05 DIAGNOSIS — R69 Illness, unspecified: Secondary | ICD-10-CM | POA: Diagnosis not present

## 2021-03-11 ENCOUNTER — Other Ambulatory Visit: Payer: Self-pay | Admitting: Family Medicine

## 2021-03-11 DIAGNOSIS — E785 Hyperlipidemia, unspecified: Secondary | ICD-10-CM

## 2021-03-11 DIAGNOSIS — I10 Essential (primary) hypertension: Secondary | ICD-10-CM

## 2021-03-22 ENCOUNTER — Other Ambulatory Visit: Payer: Self-pay | Admitting: Family Medicine

## 2021-03-22 DIAGNOSIS — R739 Hyperglycemia, unspecified: Secondary | ICD-10-CM

## 2021-03-26 NOTE — Patient Instructions (Addendum)
It was great to see you again today, I will be in touch with your labs as soon as possible I would recommend that you have your COVID-19 booster or 4th dose if not done already Please also consider getting the Shingrix vaccine series at your pharmacy  You might see an optometrist for your eye exam  I will let Dr Debara Pickett know that you adjusted your BP meds   Take care!

## 2021-03-26 NOTE — Progress Notes (Signed)
[place] at [facility] [address], [place], [place] [address] [phone]  Date:  03/28/2021   Name:  [name]   DOB:  12-08-1946   MRN:  [id number]  PCP:  Darreld Mclean, MD    Chief Complaint: Blood Pressure Follow up   History of Present Illness:  [name] is a 75 y.o. very pleasant male patient who presents with the following:  Following up today on blood pressure-history of atrial fibrillation, hypertension, diabetes, obstructive sleep apnea Last seen by myself in November 2020 His wife [name] has been fighting cancer, she is currently doing quite well which is great news  He feels like he has stayed in sinus rhythm-he can generally tell when he is in atrial for He is taking eliquis as prescribed He does check his BP at home- it was elevated recently up to systolic blood pressure 891 so he got concerned He began taking his toprol BID in response to this and his BP improved -he feels fine on this higher dose, currently taking 50 mg Toprol-XL  Eye exam- this is overdue, reminded him to do this  Labs and A1c are due Foot exam-update today Urine microalbumin COVID booster/fourth dose-recommended to get this done Shingrix-he is not sure, may have been done at pharmacy.  He will try to confirm this Most recent labs on chart from about 1 year ago  Chlorthalidone 25 Diltiazem 300 Eliquis 5 twice daily Metformin XR 500 twice daily Toprol-XL 25 Pravastatin 20  He was seen by his cardiologist, Dr. Debara Pickett also about 1 year ago ASSESSMENT: 1. Hypertension - controlled 2. Paroxysmal atrial flutter on Eliquis, CHADSVASC score of 2 3. OSA-noncompliant with CPAP PLAN: 1.   [name] is doing well now back in sinus rhythm.  He is tolerating Eliquis which will be lifelong.  His blood pressure is well controlled.  He does have a history of OSA but has not been compliant with his CPAP but intends to reach out  to his DME provider to restart therapy.  Plan follow-up with me annually or sooner as necessary.  Lab Results  Component Value Date   HGBA1C 7.2 (H) 11/14/2019    Patient Active Problem List   Diagnosis Date Noted  . Typical atrial flutter ([place])   . Anticoagulated 02/15/2020  . Anticoagulated 02/15/2020  . Sleep apnea 01/24/2020  . Controlled type 2 diabetes mellitus without complication, without long-term current use of insulin ([place]) 04/02/2017  . Left hip pain 02/25/2016  . CMV infection, acute ([place]) 09/11/2015  . Low HDL (under 40) 07/12/2013  . History of angioedema 11/08/2012  . Paroxysmal atrial flutter ([place]) 02/04/2012  . Essential hypertension 06/15/2009  . BRONCHOSPASM 06/14/2009    Past Medical History:  Diagnosis Date  . Allergic angioedema   . Allergy   . Hypertension   . Non-insulin dependent type 2 diabetes mellitus ([place])   . PAF (paroxysmal atrial fibrillation) ([place])   . Pneumonia   . RAD (reactive airway disease)   . Sleep apnea    C-pap intolerant  . Tick bite     Past Surgical History:  Procedure Laterality Date  . APPENDECTOMY    . CARDIOVERSION N/A 02/21/2020   Procedure: CARDIOVERSION;  Surgeon: Skeet Latch, MD;  Location: [facility];  Service: Cardiovascular;  Laterality: N/A;  . CHOLECYSTECTOMY    . COLONOSCOPY    . stress myoview  12/13/2008   EF 61% LV  normal  .  TONSILLECTOMY      Social History   Tobacco Use  . Smoking status: Former Smoker    Types: Cigarettes, Pipe    Quit date: 10/05/1996    Years since quitting: 24.4  . Smokeless tobacco: Former Systems developer    Types: Chew  . Tobacco comment: Occasionally  Substance Use Topics  . Alcohol use: No    Alcohol/week: 0.0 standard drinks  . Drug use: No    Family History  Problem Relation Age of Onset  . Pancreatic cancer Mother   . Hypertension Mother   . Cancer Mother   . Hypertension Sister   . Asthma Son   . Stroke Father   . Hypertension Father   . Hypertension  Brother   . Hypertension Daughter   . Cancer Maternal Grandfather   . Colon polyps Neg Hx   . Esophageal cancer Neg Hx   . Rectal cancer Neg Hx   . Stomach cancer Neg Hx     Allergies  Allergen Reactions  . Ibuprofen Swelling  . Lisinopril Swelling    Swelling of face and lips   . Oxaprozin Swelling    Swelling of face and lips  . Ziac [Bisoprolol-Hydrochlorothiazide] Swelling    Per patient records from Dr. Florina Ou.  Thayer Jew Hcl] Itching  . Hctz [Hydrochlorothiazide] Swelling  . Norvasc [Amlodipine Besylate]     Unknown     Medication list has been reviewed and updated.  Current Outpatient Medications on File Prior to Visit  Medication Sig Dispense Refill  . cetirizine (ZYRTEC) 10 MG tablet Take 10 mg by mouth daily.    . chlorthalidone (HYGROTON) 25 MG tablet TAKE 1 TABLET BY MOUTH EVERY DAY 30 tablet 0  . diltiazem (CARDIZEM CD) 300 MG 24 hr capsule TAKE 1 CAPSULE BY MOUTH EVERY DAY 30 capsule 0  . ELIQUIS 5 MG TABS tablet TAKE 1 TABLET BY MOUTH TWICE A DAY 60 tablet 6  . metFORMIN (GLUCOPHAGE-XR) 500 MG 24 hr tablet TAKE 2 TABLETS BY MOUTH TWICE A DAY 120 tablet 0  . metoprolol succinate (TOPROL-XL) 25 MG 24 hr tablet TAKE 1 TABLET BY MOUTH EVERY DAY (Patient taking differently: Take by mouth in the morning and at bedtime. Pt increased to BID) 90 tablet 1  . pravastatin (PRAVACHOL) 20 MG tablet TAKE 1 TABLET BY MOUTH EVERY DAY 30 tablet 0   No current facility-administered medications on file prior to visit.    Review of Systems:  As per HPI- otherwise negative.   Physical Examination: Vitals:   03/28/21 1123  BP: (!) 142/78  Pulse: 66  Resp: 18  SpO2: 98%   Vitals:   03/28/21 1123  Weight: 259 lb (117.5 kg)  Height: 6\' 2"  (1.88 m)   Body mass index is 33.25 kg/m. Ideal Body Weight: Weight in (lb) to have BMI = 25: 194.3  GEN: no acute distress.  Tall build, mild obesity.  Looks well HEENT: Atraumatic, Normocephalic.  Bilateral TM  wnl, oropharynx normal.  PEERL,EOMI.   Ears and Nose: No external deformity. CV: RRR, No M/G/R. No JVD. No thrill. No extra heart sounds. PULM: CTA B, no wheezes, crackles, rhonchi. No retractions. No resp. distress. No accessory muscle use. ABD: S, NT, ND, +BS. No rebound. No HSM. EXTR: No c/c/e PSYCH: Normally interactive. Conversant.  Foot exam: done today   Assessment and Plan: Essential hypertension - Plan: diltiazem (CARDIZEM CD) 300 MG 24 hr capsule, chlorthalidone (HYGROTON) 25 MG tablet, CBC, Comprehensive metabolic panel, CANCELED: CBC,  CANCELED: Comprehensive metabolic panel  Controlled type 2 diabetes mellitus without complication, without long-term current use of insulin ([facility]) - Plan: metFORMIN (GLUMETZA) 1000 MG (MOD) 24 hr tablet, Comprehensive metabolic panel, Hemoglobin A1c, Microalbumin / creatinine urine ratio, CANCELED: Comprehensive metabolic panel, CANCELED: Hemoglobin A1c, CANCELED: Microalbumin / creatinine urine ratio  Dyslipidemia - Plan: pravastatin (PRAVACHOL) 20 MG tablet, Lipid panel, CANCELED: Lipid panel  Paroxysmal atrial flutter ([facility]) - Plan: apixaban (ELIQUIS) 5 MG TABS tablet, metoprolol succinate (TOPROL-XL) 50 MG 24 hr tablet  Screening for prostate cancer - Plan: PSA, CANCELED: PSA  Hyperglycemia - Plan: metFORMIN (GLUMETZA) 1000 MG (MOD) 24 hr tablet  Patient today for follow-up visit.  I have not seen him in some time, though we have been in touch via MyChart Refill medications He has changed his metoprolol to 50 mg daily in response to elevated blood pressure.  Blood pressure is now reasonable, refilled medication at the same dose I will also touch base with his cardiologist, they may wish to get him in for a routine visit Will plan further follow- up pending labs. Discussed and encouraged appropriate immunizations Will plan further follow- up pending labs  This visit occurred during the SARS-CoV-2 public health emergency.  Safety protocols were  in place, including screening questions prior to the visit, additional usage of staff PPE, and extensive cleaning of exam room while observing appropriate contact time as indicated for disinfecting solutions.    Signed Lamar Blinks, MD

## 2021-03-28 ENCOUNTER — Ambulatory Visit (INDEPENDENT_AMBULATORY_CARE_PROVIDER_SITE_OTHER): Payer: Medicare HMO | Admitting: Family Medicine

## 2021-03-28 ENCOUNTER — Other Ambulatory Visit: Payer: Self-pay | Admitting: Family Medicine

## 2021-03-28 ENCOUNTER — Encounter: Payer: Self-pay | Admitting: Family Medicine

## 2021-03-28 ENCOUNTER — Other Ambulatory Visit: Payer: Self-pay

## 2021-03-28 VITALS — BP 142/78 | HR 66 | Resp 18 | Ht 74.0 in | Wt 259.0 lb

## 2021-03-28 DIAGNOSIS — Z125 Encounter for screening for malignant neoplasm of prostate: Secondary | ICD-10-CM | POA: Diagnosis not present

## 2021-03-28 DIAGNOSIS — R739 Hyperglycemia, unspecified: Secondary | ICD-10-CM

## 2021-03-28 DIAGNOSIS — E119 Type 2 diabetes mellitus without complications: Secondary | ICD-10-CM | POA: Diagnosis not present

## 2021-03-28 DIAGNOSIS — I10 Essential (primary) hypertension: Secondary | ICD-10-CM | POA: Diagnosis not present

## 2021-03-28 DIAGNOSIS — I4892 Unspecified atrial flutter: Secondary | ICD-10-CM | POA: Diagnosis not present

## 2021-03-28 DIAGNOSIS — E785 Hyperlipidemia, unspecified: Secondary | ICD-10-CM | POA: Diagnosis not present

## 2021-03-28 MED ORDER — METFORMIN HCL ER 500 MG PO TB24: 1000.0000 mg | ORAL_TABLET | Freq: Two times a day (BID) | ORAL | 3 refills | Status: DC

## 2021-03-28 MED ORDER — METOPROLOL SUCCINATE ER 50 MG PO TB24: 50.0000 mg | ORAL_TABLET | Freq: Every day | ORAL | 3 refills | Status: DC

## 2021-03-28 MED ORDER — CHLORTHALIDONE 25 MG PO TABS: 25.0000 mg | ORAL_TABLET | Freq: Every day | ORAL | 3 refills | Status: DC

## 2021-03-28 MED ORDER — DILTIAZEM HCL ER COATED BEADS 300 MG PO CP24: 300.0000 mg | ORAL_CAPSULE | Freq: Every day | ORAL | 3 refills | Status: DC

## 2021-03-28 MED ORDER — METFORMIN HCL ER (MOD) 1000 MG PO TB24: 1000.0000 mg | ORAL_TABLET | Freq: Two times a day (BID) | ORAL | 3 refills | Status: DC

## 2021-03-28 MED ORDER — PRAVASTATIN SODIUM 20 MG PO TABS: 20.0000 mg | ORAL_TABLET | Freq: Every day | ORAL | 3 refills | Status: DC

## 2021-03-28 MED ORDER — ELIQUIS 5 MG PO TABS: 1.0000 | ORAL_TABLET | Freq: Two times a day (BID) | ORAL | 3 refills | Status: DC

## 2021-03-28 NOTE — Telephone Encounter (Signed)
Pharmacy note:  Alternative Requested:NOT COVERED BY INSURANCE, PLEASE SEND AN ALTERMATIVE, ISNURANCE PREFFERES METFORMIN 1000 MG ISNTEAD OF THE ER VERSION.

## 2021-04-03 ENCOUNTER — Other Ambulatory Visit: Payer: Self-pay

## 2021-04-03 ENCOUNTER — Other Ambulatory Visit (INDEPENDENT_AMBULATORY_CARE_PROVIDER_SITE_OTHER): Payer: Medicare HMO

## 2021-04-03 ENCOUNTER — Encounter: Payer: Self-pay | Admitting: Family Medicine

## 2021-04-03 DIAGNOSIS — E119 Type 2 diabetes mellitus without complications: Secondary | ICD-10-CM | POA: Diagnosis not present

## 2021-04-03 DIAGNOSIS — I10 Essential (primary) hypertension: Secondary | ICD-10-CM | POA: Diagnosis not present

## 2021-04-03 DIAGNOSIS — E785 Hyperlipidemia, unspecified: Secondary | ICD-10-CM | POA: Diagnosis not present

## 2021-04-03 DIAGNOSIS — Z125 Encounter for screening for malignant neoplasm of prostate: Secondary | ICD-10-CM | POA: Diagnosis not present

## 2021-04-03 DIAGNOSIS — E786 Lipoprotein deficiency: Secondary | ICD-10-CM

## 2021-04-03 LAB — CBC
HCT: 44.4 % (ref 39.0–52.0)
Hemoglobin: 15.2 g/dL (ref 13.0–17.0)
MCHC: 34.2 g/dL (ref 30.0–36.0)
MCV: 87.3 fl (ref 78.0–100.0)
Platelets: 315 10*3/uL (ref 150.0–400.0)
RBC: 5.09 Mil/uL (ref 4.22–5.81)
RDW: 13.8 % (ref 11.5–15.5)
WBC: 7.6 10*3/uL (ref 4.0–10.5)

## 2021-04-03 LAB — HEMOGLOBIN A1C: Hgb A1c MFr Bld: 7 % — ABNORMAL HIGH (ref 4.6–6.5)

## 2021-04-03 LAB — MICROALBUMIN / CREATININE URINE RATIO
Creatinine,U: 59.1 mg/dL
Microalb Creat Ratio: 1.2 mg/g (ref 0.0–30.0)
Microalb, Ur: <0.7 mg/dL (ref 0.0–1.9)

## 2021-04-03 LAB — COMPREHENSIVE METABOLIC PANEL
ALT: 25 U/L (ref 0–53)
AST: 22 U/L (ref 0–37)
Albumin: 4.1 g/dL (ref 3.5–5.2)
Alkaline Phosphatase: 56 U/L (ref 39–117)
BUN: 21 mg/dL (ref 6–23)
CO2: 31 mEq/L (ref 19–32)
Calcium: 9.9 mg/dL (ref 8.4–10.5)
Chloride: 98 mEq/L (ref 96–112)
Creatinine, Ser: 1.06 mg/dL (ref 0.40–1.50)
GFR: 70.17 mL/min (ref >60.00)
Glucose, Bld: 170 mg/dL — ABNORMAL HIGH (ref 70–99)
Potassium: 4.7 mEq/L (ref 3.5–5.1)
Sodium: 137 mEq/L (ref 135–145)
Total Bilirubin: 0.6 mg/dL (ref 0.2–1.2)
Total Protein: 7.2 g/dL (ref 6.0–8.3)

## 2021-04-03 LAB — LIPID PANEL
Cholesterol: 141 mg/dL (ref 0–200)
HDL: 36.7 mg/dL — ABNORMAL LOW (ref >39.00)
LDL Cholesterol: 73 mg/dL (ref 0–99)
NonHDL: 104.39
Total CHOL/HDL Ratio: 4
Triglycerides: 157 mg/dL — ABNORMAL HIGH (ref 0.0–149.0)
VLDL: 31.4 mg/dL (ref 0.0–40.0)

## 2021-04-03 LAB — PSA: PSA: 0.73 ng/mL (ref 0.10–4.00)

## 2021-04-03 NOTE — Progress Notes (Signed)
Received his labs, message to pt  Results for orders placed or performed in visit on 04/03/21  PSA  Result Value Ref Range   PSA 0.73 0.10 - 4.00 ng/mL  Microalbumin / creatinine urine ratio  Result Value Ref Range   Microalb, Ur <0.7 0.0 - 1.9 mg/dL   Creatinine,U 59.1 mg/dL   Microalb Creat Ratio 1.2 0.0 - 30.0 mg/g  Lipid panel  Result Value Ref Range   Cholesterol 141 0 - 200 mg/dL   Triglycerides 157.0 (H) 0.0 - 149.0 mg/dL   HDL 36.70 (L) >39.00 mg/dL   VLDL 31.4 0.0 - 40.0 mg/dL   LDL Cholesterol 73 0 - 99 mg/dL   Total CHOL/HDL Ratio 4    NonHDL 104.39   Hemoglobin A1c  Result Value Ref Range   Hgb A1c MFr Bld 7.0 (H) 4.6 - 6.5 %  Comprehensive metabolic panel  Result Value Ref Range   Sodium 137 135 - 145 mEq/L   Potassium 4.7 3.5 - 5.1 mEq/L   Chloride 98 96 - 112 mEq/L   CO2 31 19 - 32 mEq/L   Glucose, Bld 170 (H) 70 - 99 mg/dL   BUN 21 6 - 23 mg/dL   Creatinine, Ser 1.06 0.40 - 1.50 mg/dL   Total Bilirubin 0.6 0.2 - 1.2 mg/dL   Alkaline Phosphatase 56 39 - 117 U/L   AST 22 0 - 37 U/L   ALT 25 0 - 53 U/L   Total Protein 7.2 6.0 - 8.3 g/dL   Albumin 4.1 3.5 - 5.2 g/dL   GFR 70.17 >60.00 mL/min   Calcium 9.9 8.4 - 10.5 mg/dL  CBC  Result Value Ref Range   WBC 7.6 4.0 - 10.5 K/uL   RBC 5.09 4.22 - 5.81 Mil/uL   Platelets 315.0 150.0 - 400.0 K/uL   Hemoglobin 15.2 13.0 - 17.0 g/dL   HCT 44.4 39.0 - 52.0 %   MCV 87.3 78.0 - 100.0 fl   MCHC 34.2 30.0 - 36.0 g/dL   RDW 13.8 11.5 - 15.5 %

## 2021-04-12 MED ORDER — ROSUVASTATIN CALCIUM 20 MG PO TABS: 20.0000 mg | ORAL_TABLET | Freq: Every day | ORAL | 3 refills | Status: DC

## 2021-04-30 ENCOUNTER — Telehealth: Payer: Medicare HMO

## 2021-04-30 NOTE — Progress Notes (Signed)
Cardiology Clinic Note   Patient Name: [name] Date of Encounter: 05/01/2021  Primary Care Provider:  Darreld Mclean, MD Primary Cardiologist:  Jared Casino, MD  Patient Profile    [name]. [name] 75 year old male presents the clinic today for follow-up evaluation of his paroxysmal atrial flutter, and essential hypertension.  Past Medical History    Past Medical History:  Diagnosis Date  . Allergic angioedema   . Allergy   . Hypertension   . Non-insulin dependent type 2 diabetes mellitus ([place])   . PAF (paroxysmal atrial fibrillation) ([place])   . Pneumonia   . RAD (reactive airway disease)   . Sleep apnea    C-pap intolerant  . Tick bite    Past Surgical History:  Procedure Laterality Date  . APPENDECTOMY    . CARDIOVERSION N/A 02/21/2020   Procedure: CARDIOVERSION;  Surgeon: Jared Latch, MD;  Location: [place];  Service: Cardiovascular;  Laterality: N/A;  . CHOLECYSTECTOMY    . COLONOSCOPY    . stress myoview  12/13/2008   EF 61% LV  normal  . TONSILLECTOMY      Allergies  Allergies  Allergen Reactions  . Ibuprofen Swelling  . Lisinopril Swelling    Swelling of face and lips   . Oxaprozin Swelling    Swelling of face and lips  . Ziac [Bisoprolol-Hydrochlorothiazide] Swelling    Per patient records from Dr. Florina Ou.  Thayer Jew Hcl] Itching  . Hctz [Hydrochlorothiazide] Swelling  . Norvasc [Amlodipine Besylate]     Unknown     History of Present Illness    [name] has a PMH of paroxysmal atrial flutter on Xarelto, first-degree AV block, OSA on CPAP, and hypertension.  He was found to be in atrial flutter and outpatient DCCV was planned.  He spontaneously converted to sinus rhythm.  He reported that he did not feel much better after converting.  He reported being very aware of his atrial flutter.  He was previously on diltiazem and low-dose beta-blocker was added to his medication regimen for rate control.  He  did feel like the medicine made him a little bit more tired.  His wife noted that he was more depressed.  It was not clear whether his mood was related to the beta-blocker or other factors.  He was not noted to have any recurrence of atrial flutter or fibrillation and was taken off his Xarelto.  His CHA2DS2-VASc score was 1.  It was felt that he was low risk.  He reported problems with increased blood pressure.  He noted that he had several intolerances/allergies to medications.  These include ACE inhibitor's, and ARB medications.  He also reported an allergy to HCTZ but did not recall whether it was a rash.  He followed up and did not have further complaints.  He continued to do aerobic and resistance types of exercises.  He had put on some increased muscle mass.  He denied chest pain and shortness of breath.  He denied recurrent palpitations or atrial fibrillation/flutter.  His blood pressure was well controlled.  He denied any edema on chlorthalidone.  He returns for follow-up and unfortunately sustained a CMV infection.  He indicated that he had possibly had a tickborne illness that was treated with doxycycline at [facility].  He was seen by Dr. Linus [name] with infectious disease and Dr. Havery [name] with GI for elevated LFTs and right upper quadrant pain.  He reported that his symptoms had significantly improved.  He denied recurrent atrial flutter but did note occasional palpitations.  He was not regularly taking aspirin.  He followed up with Dr. Debara [name] 03/14/2020.  He was noted to have new atrial fibrillation.  He was restarted on apixaban and arrange for cardioversion.  His DCCV was successful.  On follow-up he reported he felt much better.  He was in sinus rhythm with first-degree AV block.  His blood pressure was well controlled.  He was on diltiazem and metoprolol and reported compliance with his apixaban.  He reported compliance with his CPAP.  He reported that he was not able to use it during the  year and his wife had cancer.  He reported she was doing much better.  He reported he would reach out to [employer] so that he may receive better fitting equipment.  He presents the clinic today for follow-up evaluation states he feels well.  He continues to exercise 4-5 days/week at the gym and be very active outside.  He reports that he is eating a heart healthy low-sodium diet.  He does occasionally have desserts.  When asked about his CPAP, he reports that he has not been using it due to needing to hear his wife at night.  He reports she is recovering from cancer and doing quite well at this time.  I will give him a salty 6 diet sheet, have asked him to resume CPAP use, we will have him maintain his physical activity and follow-up in 12 months.  Today he denies chest pain, shortness of breath, lower extremity edema, fatigue, palpitations, melena, hematuria, hemoptysis, diaphoresis, weakness, presyncope, syncope, orthopnea, and PND.   Home Medications    Prior to Admission medications   Medication Sig Start Date End Date Taking? Authorizing Provider  apixaban (ELIQUIS) 5 MG TABS tablet Take 1 tablet (5 mg total) by mouth 2 (two) times daily. 03/28/21   Copland, Gay Filler, MD  cetirizine (ZYRTEC) 10 MG tablet Take 10 mg by mouth daily.    [provider]  chlorthalidone (HYGROTON) 25 MG tablet Take 1 tablet (25 mg total) by mouth daily. 03/28/21   Copland, Gay Filler, MD  diltiazem (CARDIZEM CD) 300 MG 24 hr capsule Take 1 capsule (300 mg total) by mouth daily. 03/28/21   Copland, Gay Filler, MD  metFORMIN (GLUCOPHAGE-XR) 500 MG 24 hr tablet Take 2 tablets (1,000 mg total) by mouth 2 (two) times daily. 03/28/21   Copland, Gay Filler, MD  metoprolol succinate (TOPROL-XL) 25 MG 24 hr tablet TAKE 1 TABLET BY MOUTH EVERY DAY Patient taking differently: Take by mouth in the morning and at bedtime. Pt increased to BID 07/25/20   Hilty, Nadean Corwin, MD  metoprolol succinate (TOPROL-XL) 50 MG 24 hr tablet Take  1 tablet (50 mg total) by mouth daily. Take with or immediately following a meal. 03/28/21   Copland, Gay Filler, MD  rosuvastatin (CRESTOR) 20 MG tablet Take 1 tablet (20 mg total) by mouth daily. 04/12/21   Copland, Gay Filler, MD    Family History    Family History  Problem Relation Age of Onset  . Pancreatic cancer Mother   . Hypertension Mother   . Cancer Mother   . Hypertension Sister   . Asthma Son   . Stroke Father   . Hypertension Father   . Hypertension Brother   . Hypertension Daughter   . Cancer Maternal Grandfather   . Colon polyps Neg Hx   . Esophageal cancer Neg Hx   . Rectal cancer Neg  Hx   . Stomach cancer Neg Hx    He indicated that his mother is deceased. He indicated that his father is deceased. He indicated that his sister is alive. He indicated that his brother is alive. He indicated that his maternal grandmother is deceased. He indicated that his maternal grandfather is deceased. He indicated that his paternal grandmother is deceased. He indicated that his paternal grandfather is deceased. He indicated that his daughter is alive. He indicated that his son is alive. He indicated that the status of his neg hx is unknown.  Social History    Social History   Socioeconomic History  . Marital status: Married    Spouse name: Not on file  . Number of children: Not on file  . Years of education: Not on file  . Highest education level: Not on file  Occupational History  . Not on file  Tobacco Use  . Smoking status: Former Smoker    Types: Cigarettes, Pipe    Quit date: 10/05/1996    Years since quitting: 24.5  . Smokeless tobacco: Former Systems developer    Types: Chew  . Tobacco comment: Occasionally  Substance and Sexual Activity  . Alcohol use: No    Alcohol/week: 0.0 standard drinks  . Drug use: No  . Sexual activity: Yes    Birth control/protection: None  Other Topics Concern  . Not on file  Social History Narrative  . Not on file   Social Determinants of  Health   Financial Resource Strain: Low Risk   . Difficulty of Paying Living Expenses: Not hard at all  Food Insecurity: Not on file  Transportation Needs: Not on file  Physical Activity: Not on file  Stress: Not on file  Social Connections: Not on file  Intimate Partner Violence: Not on file     Review of Systems    General:  No chills, fever, night sweats or weight changes.  Cardiovascular:  No chest pain, dyspnea on exertion, edema, orthopnea, palpitations, paroxysmal nocturnal dyspnea. Dermatological: No rash, lesions/masses Respiratory: No cough, dyspnea Urologic: No hematuria, dysuria Abdominal:   No nausea, vomiting, diarrhea, bright red blood per rectum, melena, or hematemesis Neurologic:  No visual changes, wkns, changes in mental status. All other systems reviewed and are otherwise negative except as noted above.  Physical Exam    VS:  BP 134/72   Pulse 68   Ht 6\' 2"  (1.88 m)   Wt 253 lb 6.4 oz (114.9 kg)   SpO2 97%   BMI 32.53 kg/m  , BMI Body mass index is 32.53 kg/m. GEN: Well nourished, well developed, in no acute distress. HEENT: normal. Neck: Supple, no JVD, carotid bruits, or masses. Cardiac: RRR, no murmurs, rubs, or gallops. No clubbing, cyanosis, edema.  Radials/DP/PT 2+ and equal bilaterally.  Respiratory:  Respirations regular and unlabored, clear to auscultation bilaterally. GI: Soft, nontender, nondistended, BS + x 4. MS: no deformity or atrophy. Skin: warm and dry, no rash. Neuro:  Strength and sensation are intact. Psych: Normal affect.  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 Left ventricular ejection fraction, by estimation, is 55 to 60%. The  left ventricle has normal function. The left ventrical has no regional  wall motion abnormalities. Left ventricular diastolic parameters were  normal.  2. Right ventricular systolic function is normal. The right ventricular  size is normal.  3. No left atrial/left atrial appendage thrombus was detected.  4. The mitral valve is normal in structure and function. no evidence of  mitral valve regurgitation. No evidence of mitral stenosis.  5. The aortic valve is normal in structure and function. Aortic valve  regurgitation is not visualized. No aortic stenosis is present.  6. The inferior vena cava is normal in size with greater than 50%  respiratory variability, suggesting right atrial pressure of 3 mmHg.   Assessment & Plan   1.  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  No recent episodes of irregular or accelerated heartbeat.  Reports compliance with apixaban.  Denies bleeding issues.  CHA2DS2-VASc score 2. Continue diltiazem, metoprolol, apixaban Heart healthy low-sodium diet-salty 6 given Increase physical activity as tolerated Avoid triggers caffeine, chocolate, EtOH, dehydration etc.  Essential hypertension-BP  134/72.  Well-controlled at home. Continue metoprolol, diltiazem Heart healthy low-sodium diet-salty 6 given Increase physical activity as tolerated  Obstructive sleep apnea- reports non compliance.  Reviewed importance of CPAP use with regards to atrial fibrillation/flutter and hypertension. Restart CPAP use.  Disposition: Follow-up with Dr. Debara [name] in 12 months.  Jossie Ng. Avannah Decker NP-C    05/01/2021, 10:16 AM [address] Office [phone] Fax [phone]  Notice: This dictation was prepared with Dragon dictation along with smaller phrase technology. Any transcriptional errors that result from this process are unintentional and may not be corrected upon review.  I spent 13 minutes examining this patient, reviewing medications, and using patient centered shared decision making involving her cardiac care.  Prior to her visit I spent greater than 20 minutes reviewing her past medical history,  medications, and prior cardiac tests.

## 2021-05-01 ENCOUNTER — Other Ambulatory Visit: Payer: Self-pay

## 2021-05-01 ENCOUNTER — Encounter: Payer: Self-pay | Admitting: General Practice

## 2021-05-01 ENCOUNTER — Ambulatory Visit: Payer: Medicare HMO | Admitting: General Practice

## 2021-05-01 VITALS — BP 134/72 | HR 68 | Ht 74.0 in | Wt 253.4 lb

## 2021-05-01 DIAGNOSIS — I10 Essential (primary) hypertension: Secondary | ICD-10-CM

## 2021-05-01 DIAGNOSIS — G4730 Sleep apnea, unspecified: Secondary | ICD-10-CM | POA: Diagnosis not present

## 2021-05-01 DIAGNOSIS — I4892 Unspecified atrial flutter: Secondary | ICD-10-CM

## 2021-05-01 NOTE — Patient Instructions (Signed)
Medication Instructions:  The current medical regimen is effective;  continue present plan and medications as directed. Please refer to the Current Medication list given to you today.  *If you need a refill on your cardiac medications before your next appointment, please call your pharmacy*  Lab Work:   Testing/Procedures:  NONE    NONE  Special Instructions PLEASE READ AND FOLLOW SALTY 6-ATTACHED-1,800mg  daily  PLEASE INCREASE PHYSICAL ACTIVITY AS TOLERATED  Follow-Up: Your next appointment:  12 month(s) In Person with You may see Pixie Casino, MD IF UNAVAILABLE JESSE CLEAVER, FNP-C  or one of the following Advanced Practice Providers on your designated Care Team:  Almyra Deforest, PA-C Fabian Sharp, [place] or Roby Lofts, PA-C  Please call our office 2 months in advance to schedule this appointment   At [facility], you and your health needs are our priority.  As part of our continuing mission to provide you with exceptional heart care, we have created designated Provider Care Teams.  These Care Teams include your primary Cardiologist (physician) and Advanced Practice Providers (APPs -  Physician Assistants and Nurse Practitioners) who all work together to provide you with the care you need, when you need it.            6 SALTY THINGS TO AVOID     1,800MG  DAILY

## 2021-05-29 ENCOUNTER — Ambulatory Visit: Payer: Medicare HMO | Attending: Internal Medicine

## 2021-05-29 DIAGNOSIS — Z23 Encounter for immunization: Secondary | ICD-10-CM

## 2021-05-29 NOTE — Progress Notes (Signed)
   Covid-19 Vaccination Clinic  Name:  DRAPER GALLON    MRN: [id number] DOB: 1946/10/25  05/29/2021  [name] was observed post Covid-19 immunization for 15 minutes without incident. He was provided with Vaccine Information Sheet and instruction to access the V-Safe system.   [name] was instructed to call 911 with any severe reactions post vaccine: Difficulty breathing  Swelling of face and throat  A fast heartbeat  A bad rash all over body  Dizziness and weakness   Immunizations Administered     Name Date Dose VIS Date Route   PFIZER Comrnaty(Gray TOP) Covid-19 Vaccine 05/29/2021 11:41 AM 0.3 mL 11/22/2020 Intramuscular   Manufacturer: [place]   Lot: [id number]   [place]: [phone]

## 2021-05-30 ENCOUNTER — Other Ambulatory Visit (HOSPITAL_BASED_OUTPATIENT_CLINIC_OR_DEPARTMENT_OTHER): Payer: Self-pay

## 2021-05-30 MED ORDER — COVID-19 MRNA VAC-TRIS(PFIZER) 30 MCG/0.3ML IM SUSP
INTRAMUSCULAR | 0 refills | Status: DC
  Filled 2021-05-30: qty 0.3, 1d supply, fill #0

## 2021-07-16 ENCOUNTER — Telehealth: Payer: Self-pay | Admitting: Pharmacist

## 2021-07-16 NOTE — Chronic Care Management (AMB) (Signed)
Chronic Care Management Pharmacy Assistant   Name: [name]  MRN: [id number] DOB: 06-09-1946  Reason for Encounter: Disease State Hypertension  Recent office visits:  05/29/21 Lamar Blinks MD(PCP)- Patient was seen to receive his Covid Vaccine.   04/03/21 Janett Billow Copland MD(PCP)- Patient was seen for Low HDL. Patient stopped Pravastatin 20 mg daily and started Rosuvastatin 20 mg daily. Follow up in 6 months.  03/28/21 Janett Billow Copland MD(PCP)- Patient was seen for Hypertension. Labs were ordered and there were medication changes. Metformin HCI was changed to 1000 mg BID and patients Metoprolol Succinate was increased to 50 mg daily due to elevated BP. Doxycycline Hyclate was discontinued. Patient will follow up pending labs.  Recent consult visits:  05/01/21 Coletta Memos NP(Cardiology)- Patient was seen for Paroxysmal Atrial Flutter. An EKG was performed during visit   Hospital visits:  None in previous 6 months  Medications: Outpatient Encounter Medications as of 07/16/2021  Medication Sig   apixaban (ELIQUIS) 5 MG TABS tablet Take 1 tablet (5 mg total) by mouth 2 (two) times daily.   cetirizine (ZYRTEC) 10 MG tablet Take 10 mg by mouth daily.   chlorthalidone (HYGROTON) 25 MG tablet Take 1 tablet (25 mg total) by mouth daily.   COVID-19 mRNA Vac-TriS, Pfizer, SUSP injection Inject into the muscle.   diltiazem (CARDIZEM CD) 300 MG 24 hr capsule Take 1 capsule (300 mg total) by mouth daily.   metFORMIN (GLUCOPHAGE-XR) 500 MG 24 hr tablet Take 2 tablets (1,000 mg total) by mouth 2 (two) times daily.   metoprolol succinate (TOPROL-XL) 25 MG 24 hr tablet TAKE 1 TABLET BY MOUTH EVERY DAY   metoprolol succinate (TOPROL-XL) 50 MG 24 hr tablet Take 1 tablet (50 mg total) by mouth daily. Take with or immediately following a meal.   rosuvastatin (CRESTOR) 20 MG tablet Take 1 tablet (20 mg total) by mouth daily.   No facility-administered encounter medications on file as of  07/16/2021.    Reviewed chart prior to disease state call. Spoke with patient regarding BP  Recent Office Vitals: BP Readings from Last 3 Encounters:  05/01/21 134/72  03/28/21 (!) 142/78  03/14/20 118/68   Pulse Readings from Last 3 Encounters:  05/01/21 68  03/28/21 66  03/14/20 70    Wt Readings from Last 3 Encounters:  05/01/21 253 lb 6.4 oz (114.9 kg)  03/28/21 259 lb (117.5 kg)  03/14/20 258 lb 6.4 oz (117.2 kg)     Kidney Function Lab Results  Component Value Date/Time   CREATININE 1.06 04/03/2021 07:40 AM   CREATININE 1.04 02/20/2020 08:59 AM   CREATININE 0.98 03/20/2016 08:42 AM   CREATININE 1.09 12/07/2015 11:05 AM   GFR 70.17 04/03/2021 07:40 AM   GFRNONAA 72 02/20/2020 08:59 AM   GFRNONAA 79 03/20/2016 08:42 AM   GFRAA 83 02/20/2020 08:59 AM   GFRAA >89 03/20/2016 08:42 AM    BMP Latest Ref Rng & Units 04/03/2021 02/20/2020 01/24/2020  Glucose 70 - 99 mg/dL 170(H) 194(H) 160(H)  BUN 6 - 23 mg/dL '21 18 22  '$ Creatinine 0.40 - 1.50 mg/dL 1.06 1.04 1.30(H)  BUN/Creat Ratio 10 - 24 - 17 17  Sodium 135 - 145 mEq/L 137 136 138  Potassium 3.5 - 5.1 mEq/L 4.7 4.4 4.0  Chloride 96 - 112 mEq/L 98 95(L) 98  CO2 19 - 32 mEq/L '31 26 21  '$ Calcium 8.4 - 10.5 mg/dL 9.9 9.3 9.9    Current antihypertensive regimen:  Chlorthalidone 25 mg daily Metoprolol succinate  50 mg daily Diltiazem 300 mg every day  How often are you checking your Blood Pressure? weekly  Current home BP readings: Patient stated he only has to check his BP once every 10 days unless he feels the need to check more often. Patient reported his numbers usually stay around 130/80-135/80.  What recent interventions/DTPs have been made by any provider to improve Blood Pressure control since last CPP Visit: Patient stated none were made.  Any recent hospitalizations or ED visits since last visit with CPP? No  What diet changes have been made to improve Blood Pressure Control?  Patient stated he's drinking more  water and he doesn't eat much salt so lower his sodium intake any more isn't necessary.  What exercise is being done to improve your Blood Pressure Control?  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 He was working in his yard when we spoke.  Adherence Review: Is the patient currently on ACE/ARB medication? No Does the patient have >5 day gap between last estimated fill dates? No Chlorthalidone 25 mg daily last filled 07/06/21 90 DS Metoprolol succinate 50 mg daily last filled 06/24/21 90DS Diltiazem 300 mg daily last filled 07/06/21 90 DS    Star Rating Drugs: ELIQUIS 5 MG tablet last filled 07/08/21 90 DS rosuvastatin 20 MG tablet last filled 07/15/21 90 DS  [place] Pharmacist Assistant [phone]

## 2021-08-26 ENCOUNTER — Other Ambulatory Visit (HOSPITAL_BASED_OUTPATIENT_CLINIC_OR_DEPARTMENT_OTHER): Payer: Self-pay

## 2021-09-22 NOTE — Progress Notes (Addendum)
[place] at [facility] [address], [place], [place] [address] [phone]  Date:  09/25/2021   Name:  [name]   DOB:  1946/06/06   MRN:  [id number]  PCP:  Darreld Mclean, MD    Chief Complaint: 6 month follow up (Concerns/ questions: pt  thought this would be his CPE today. Pt would like testosterone checked. /Flu shot today: pt would like to weight some./Zoster: pt states he has had one/DM eye exam: scheduled in a couple of weeks )   History of Present Illness:  [name] is a 75 y.o. very pleasant male patient who presents with the following:  Patient seen today for 29-month follow-up visit.  Most recent visit with myself was in April- of atrial fibrillation, hypertension, diabetes, obstructive sleep apnea  Seen by cardiology in May: 1.  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  No recent episodes of irregular or accelerated heartbeat.  Reports compliance with apixaban.  Denies bleeding issues.  CHA2DS2-VASc score 2. Continue diltiazem, metoprolol, apixaban Heart healthy low-sodium diet-salty 6 given Increase physical activity as tolerated Avoid triggers caffeine, chocolate, EtOH, dehydration etc. Essential hypertension-BP  134/72.  Well-controlled at home. Continue metoprolol, diltiazem Heart healthy low-sodium diet-salty 6 given Increase physical activity as tolerated Obstructive sleep apnea- reports non compliance.  Reviewed importance of CPAP use with regards to atrial fibrillation/flutter and hypertension. Restart CPAP use.   He has intermittent A. 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  He has a lot of family members working there, he enjoys the work  He may work about 30 hours a week   Shingrix- recommended  Eye exam- appt coming up  Flu vaccine- he prefers to do later in the season  COVID booster- he plans to do today   Eliquis 5 twice daily Chlorthalidone Cardizem 300 Metoprolol 25 mg Crestor- it looks like he is actually taking pravachol now. He would like do see his HDL progress Metformin at thousand twice daily  Would like to check his T levels today for fatigue Patient Active Problem List   Diagnosis Date Noted   Typical atrial flutter ([place])    Anticoagulated 02/15/2020   Sleep apnea 01/24/2020   Controlled type 2 diabetes mellitus without complication, without long-term current use of insulin ([place]) 04/02/2017   Left hip pain 02/25/2016   CMV infection, acute ([place]) 09/11/2015   Low HDL (under 40) 07/12/2013   History of angioedema 11/08/2012   Paroxysmal atrial flutter ([place]) 02/04/2012   Essential hypertension 06/15/2009   BRONCHOSPASM 06/14/2009    Past Medical History:  Diagnosis Date   Allergic angioedema    Allergy    Hypertension    Non-insulin dependent type 2 diabetes mellitus (HCC)    PAF (paroxysmal atrial fibrillation) (HCC)    Pneumonia    RAD (reactive airway disease)    Sleep apnea    C-pap intolerant   Tick bite     Past Surgical History:  Procedure Laterality Date   APPENDECTOMY  CARDIOVERSION N/A 02/21/2020   Procedure: CARDIOVERSION;  Surgeon: Skeet Latch, MD;  Location: [facility];  Service: Cardiovascular;  Laterality: N/A;   CHOLECYSTECTOMY     COLONOSCOPY     stress myoview  12/13/2008   EF 61% LV  normal   TONSILLECTOMY      Social History   Tobacco Use   Smoking status: Former    Types: Cigarettes, Pipe    Quit date: 10/05/1996    Years since quitting: 24.9   Smokeless tobacco: Former    Types: Chew   Tobacco comments:     Occasionally  Substance Use Topics   Alcohol use: No    Alcohol/week: 0.0 standard drinks   Drug use: No    Family History  Problem Relation Age of Onset   Pancreatic cancer Mother    Hypertension Mother    Cancer Mother    Hypertension Sister    Asthma Son    Stroke Father    Hypertension Father    Hypertension Brother    Hypertension Daughter    Cancer Maternal Grandfather    Colon polyps Neg Hx    Esophageal cancer Neg Hx    Rectal cancer Neg Hx    Stomach cancer Neg Hx     Allergies  Allergen Reactions   Ibuprofen Swelling   Lisinopril Swelling    Swelling of face and lips    Oxaprozin Swelling    Swelling of face and lips   Ziac [Bisoprolol-Hydrochlorothiazide] Swelling    Per patient records from Dr. Florina Ou.   Bystolic [Nebivolol Hcl] Itching   Hctz [Hydrochlorothiazide] Swelling   Norvasc [Amlodipine Besylate]     Unknown     Medication list has been reviewed and updated.  Current Outpatient Medications on File Prior to Visit  Medication Sig Dispense Refill   apixaban (ELIQUIS) 5 MG TABS tablet Take 1 tablet (5 mg total) by mouth 2 (two) times daily. 180 tablet 3   cetirizine (ZYRTEC) 10 MG tablet Take 10 mg by mouth daily.     chlorthalidone (HYGROTON) 25 MG tablet Take 1 tablet (25 mg total) by mouth daily. 90 tablet 3   diltiazem (CARDIZEM CD) 300 MG 24 hr capsule Take 1 capsule (300 mg total) by mouth daily. 90 capsule 3   metFORMIN (GLUCOPHAGE-XR) 500 MG 24 hr tablet Take 2 tablets (1,000 mg total) by mouth 2 (two) times daily. 360 tablet 3   metoprolol succinate (TOPROL-XL) 50 MG 24 hr tablet Take 1 tablet (50 mg total) by mouth daily. Take with or immediately following a meal. 90 tablet 3   pravastatin (PRAVACHOL) 20 MG tablet Take 20 mg by mouth daily.     No current facility-administered medications on file prior to visit.    Review of Systems:  As per HPI- otherwise negative.   Physical Examination: Vitals:   09/25/21 0825  BP:  132/64  Pulse: 70  Resp: 18  Temp: 98.2 F (36.8 C)  SpO2: 98%   Vitals:   09/25/21 0825  Weight: 255 lb (115.7 kg)  Height: 6' (1.829 m)   Body mass index is 34.58 kg/m. Ideal Body Weight: Weight in (lb) to have BMI = 25: 183.9  GEN: no acute distress. Tall build, obese  HEENT: Atraumatic, Normocephalic.  Ears and Nose: No external deformity. CV: RRR, No M/G/R. No JVD. No thrill. No extra heart sounds. PULM: CTA B, no wheezes, crackles, rhonchi. No retractions. No resp. distress. No accessory muscle use. ABD: S, NT, ND, +BS. No  rebound. No HSM. EXTR: No c/c/e PSYCH: Normally interactive. Conversant.   Wt Readings from Last 3 Encounters:  09/25/21 255 lb (115.7 kg)  05/01/21 253 lb 6.4 oz (114.9 kg)  03/28/21 259 lb (117.5 kg)    Assessment and Plan: Essential hypertension - Plan: Basic metabolic panel  Controlled type 2 diabetes mellitus without complication, without long-term current use of insulin ([place]) - Plan: Hemoglobin X3G, Basic metabolic panel  Paroxysmal atrial flutter (HCC)  Dyslipidemia  Low HDL (under 40) - Plan: Lipid panel  Other fatigue - Plan: Testosterone Total,Free,Bio, Males-([facility])  Follow-up on a1c and lipids as above, make adjustments as needed In SR He would like a T evaluation Discussed occasional elevation of BP- he will let me know if this continues to be an issue, if persistent we can make a med adjustment.  For now continue current regimen due to low SBP   Recommend flu and covid booster  Signed Lamar Blinks, MD  Received labs as below, message to patient  Results for orders placed or performed in visit on 09/25/21  Hemoglobin A1c  Result Value Ref Range   Hgb A1c MFr Bld 7.6 (H) 4.6 - 6.5 %  Basic metabolic panel  Result Value Ref Range   Sodium 137 135 - 145 mEq/L   Potassium 4.3 3.5 - 5.1 mEq/L   Chloride 98 96 - 112 mEq/L   CO2 32 19 - 32 mEq/L   Glucose, Bld 197 (H) 70 - 99 mg/dL   BUN 21 6 - 23 mg/dL   Creatinine,  Ser 1.07 0.40 - 1.50 mg/dL   GFR 69.15 >60.00 mL/min   Calcium 9.9 8.4 - 10.5 mg/dL  Lipid panel  Result Value Ref Range   Cholesterol 91 0 - 200 mg/dL   Triglycerides 106.0 0.0 - 149.0 mg/dL   HDL 34.50 (L) >39.00 mg/dL   VLDL 21.2 0.0 - 40.0 mg/dL   LDL Cholesterol 36 0 - 99 mg/dL   Total CHOL/HDL Ratio 3    NonHDL 56.90

## 2021-09-22 NOTE — Patient Instructions (Addendum)
It was great to see you again today, I will be in touch with your labs.  Assuming all is well please see me in about 6 months  Please get your new covid booster at your convenience, and the shingles vaccine if not done already

## 2021-09-25 ENCOUNTER — Ambulatory Visit (INDEPENDENT_AMBULATORY_CARE_PROVIDER_SITE_OTHER): Payer: Medicare HMO | Admitting: Family Medicine

## 2021-09-25 ENCOUNTER — Other Ambulatory Visit: Payer: Self-pay

## 2021-09-25 ENCOUNTER — Ambulatory Visit: Payer: Medicare HMO | Attending: Internal Medicine

## 2021-09-25 ENCOUNTER — Encounter: Payer: Self-pay | Admitting: Family Medicine

## 2021-09-25 VITALS — BP 132/64 | HR 70 | Temp 98.2°F | Resp 18 | Ht 72.0 in | Wt 255.0 lb

## 2021-09-25 DIAGNOSIS — E786 Lipoprotein deficiency: Secondary | ICD-10-CM | POA: Diagnosis not present

## 2021-09-25 DIAGNOSIS — E785 Hyperlipidemia, unspecified: Secondary | ICD-10-CM

## 2021-09-25 DIAGNOSIS — E119 Type 2 diabetes mellitus without complications: Secondary | ICD-10-CM

## 2021-09-25 DIAGNOSIS — I10 Essential (primary) hypertension: Secondary | ICD-10-CM

## 2021-09-25 DIAGNOSIS — R5383 Other fatigue: Secondary | ICD-10-CM | POA: Diagnosis not present

## 2021-09-25 DIAGNOSIS — Z23 Encounter for immunization: Secondary | ICD-10-CM | POA: Diagnosis not present

## 2021-09-25 DIAGNOSIS — I4892 Unspecified atrial flutter: Secondary | ICD-10-CM

## 2021-09-25 LAB — TESTOSTERONE TOTAL,FREE,BIO, MALES
Albumin: 4.3 g/dL (ref 3.6–5.1)
Sex Hormone Binding: 25 nmol/L (ref 22–77)
Testosterone: 225 ng/dL — ABNORMAL LOW (ref 250–827)

## 2021-09-25 LAB — BASIC METABOLIC PANEL
BUN: 21 mg/dL (ref 6–23)
CO2: 32 mEq/L (ref 19–32)
Calcium: 9.9 mg/dL (ref 8.4–10.5)
Chloride: 98 mEq/L (ref 96–112)
Creatinine, Ser: 1.07 mg/dL (ref 0.40–1.50)
GFR: 69.15 mL/min (ref >60.00)
Glucose, Bld: 197 mg/dL — ABNORMAL HIGH (ref 70–99)
Potassium: 4.3 mEq/L (ref 3.5–5.1)
Sodium: 137 mEq/L (ref 135–145)

## 2021-09-25 LAB — LIPID PANEL
Cholesterol: 91 mg/dL (ref 0–200)
HDL: 34.5 mg/dL — ABNORMAL LOW (ref >39.00)
LDL Cholesterol: 36 mg/dL (ref 0–99)
NonHDL: 56.9
Total CHOL/HDL Ratio: 3
Triglycerides: 106 mg/dL (ref 0.0–149.0)
VLDL: 21.2 mg/dL (ref 0.0–40.0)

## 2021-09-25 LAB — HEMOGLOBIN A1C: Hgb A1c MFr Bld: 7.6 % — ABNORMAL HIGH (ref 4.6–6.5)

## 2021-09-25 NOTE — Progress Notes (Signed)
.    Covid-19 Vaccination Clinic  Name:  [name]    MRN: [id number] DOB: Mar 03, 1946  09/25/2021  [name] was observed post Covid-19 immunization for 15 minutes without incident. He was provided with Vaccine Information Sheet and instruction to access the V-Safe system.   [name] was instructed to call 911 with any severe reactions post vaccine: Difficulty breathing  Swelling of face and throat  A fast heartbeat  A bad rash all over body  Dizziness and weakness

## 2021-09-26 ENCOUNTER — Encounter: Payer: Self-pay | Admitting: Family Medicine

## 2021-09-26 DIAGNOSIS — E291 Testicular hypofunction: Secondary | ICD-10-CM

## 2021-09-26 DIAGNOSIS — Z5181 Encounter for therapeutic drug level monitoring: Secondary | ICD-10-CM

## 2021-09-30 NOTE — Addendum Note (Signed)
Addended by: Lamar Blinks C on: 09/30/2021 01:18 PM   Modules accepted: Orders

## 2021-10-01 ENCOUNTER — Other Ambulatory Visit: Payer: Self-pay

## 2021-10-01 ENCOUNTER — Other Ambulatory Visit (INDEPENDENT_AMBULATORY_CARE_PROVIDER_SITE_OTHER): Payer: Medicare HMO

## 2021-10-01 DIAGNOSIS — Z5181 Encounter for therapeutic drug level monitoring: Secondary | ICD-10-CM | POA: Diagnosis not present

## 2021-10-01 DIAGNOSIS — E291 Testicular hypofunction: Secondary | ICD-10-CM

## 2021-10-01 LAB — CBC
HCT: 40.3 % (ref 39.0–52.0)
Hemoglobin: 14.3 g/dL (ref 13.0–17.0)
MCHC: 35.4 g/dL (ref 30.0–36.0)
MCV: 86.7 fl (ref 78.0–100.0)
Platelets: 310 10*3/uL (ref 150.0–400.0)
RBC: 4.65 Mil/uL (ref 4.22–5.81)
RDW: 13.3 % (ref 11.5–15.5)
WBC: 7.7 10*3/uL (ref 4.0–10.5)

## 2021-10-01 LAB — PSA: PSA: 0.73 ng/mL (ref 0.10–4.00)

## 2021-10-02 ENCOUNTER — Encounter: Payer: Self-pay | Admitting: Family Medicine

## 2021-10-02 LAB — TESTOSTERONE TOTAL,FREE,BIO, MALES
Albumin: 4 g/dL (ref 3.6–5.1)
Sex Hormone Binding: 26 nmol/L (ref 22–77)
Testosterone, Bioavailable: 77 ng/dL (ref 15.0–150.0)
Testosterone, Free: 41.8 pg/mL (ref 6.0–73.0)
Testosterone: 267 ng/dL (ref 250–827)

## 2021-10-07 ENCOUNTER — Telehealth: Payer: Self-pay | Admitting: Family Medicine

## 2021-10-07 NOTE — Telephone Encounter (Signed)
Left message for patient to call back and schedule Medicare Annual Wellness Visit (AWV) in office.  ° °If not able to come in office, please offer to do virtually or by telephone.  Left office number and my jabber #[phone]. ° °Due for AWVI ° °Please schedule at anytime with Nurse Health Advisor. °  °

## 2021-10-14 ENCOUNTER — Other Ambulatory Visit (HOSPITAL_BASED_OUTPATIENT_CLINIC_OR_DEPARTMENT_OTHER): Payer: Self-pay

## 2021-10-14 DIAGNOSIS — E119 Type 2 diabetes mellitus without complications: Secondary | ICD-10-CM | POA: Diagnosis not present

## 2021-10-14 LAB — HM DIABETES EYE EXAM

## 2021-10-14 MED ORDER — PFIZER COVID-19 VAC BIVALENT 30 MCG/0.3ML IM SUSP
INTRAMUSCULAR | 0 refills | Status: DC
  Filled 2021-10-14: qty 0.3, 1d supply, fill #0

## 2021-12-17 DIAGNOSIS — L821 Other seborrheic keratosis: Secondary | ICD-10-CM | POA: Diagnosis not present

## 2021-12-17 DIAGNOSIS — Z85828 Personal history of other malignant neoplasm of skin: Secondary | ICD-10-CM | POA: Diagnosis not present

## 2021-12-17 DIAGNOSIS — D225 Melanocytic nevi of trunk: Secondary | ICD-10-CM | POA: Diagnosis not present

## 2021-12-17 DIAGNOSIS — L720 Epidermal cyst: Secondary | ICD-10-CM | POA: Diagnosis not present

## 2021-12-17 DIAGNOSIS — D3617 Benign neoplasm of peripheral nerves and autonomic nervous system of trunk, unspecified: Secondary | ICD-10-CM | POA: Diagnosis not present

## 2021-12-17 DIAGNOSIS — L570 Actinic keratosis: Secondary | ICD-10-CM | POA: Diagnosis not present

## 2021-12-27 ENCOUNTER — Telehealth (INDEPENDENT_AMBULATORY_CARE_PROVIDER_SITE_OTHER): Payer: Medicare HMO | Admitting: Physician Assistant

## 2021-12-27 ENCOUNTER — Encounter: Payer: Self-pay | Admitting: Physician Assistant

## 2021-12-27 VITALS — Ht 72.0 in | Wt 250.0 lb

## 2021-12-27 DIAGNOSIS — R6889 Other general symptoms and signs: Secondary | ICD-10-CM | POA: Diagnosis not present

## 2021-12-27 LAB — POCT INFLUENZA A/B
Influenza A, POC: NEGATIVE
Influenza B, POC: NEGATIVE

## 2021-12-27 NOTE — Progress Notes (Signed)
TELEPHONE ENCOUNTER   Patient verbally agreed to telephone visit and is aware that copayment and coinsurance may apply. Patient was treated using telemedicine according to accepted telemedicine protocols.  Location of the patient: home Location of provider: [place] of all persons participating in the telemedicine service and role in the encounter: Inda Coke, [place] , Alfredo Bach  Subjective:   Chief Complaint  Patient presents with   Flu like symptoms     HPI   Flu-Like Symptoms Ola B Kenny Rea. is a 76 y.o. who identifies as a male who was assigned male at birth, and is being seen today for Flu like symptoms. Pt c/o headache, fever and nausea started two days ago. He says it is worse at night. Has been using Nyquil and Advil. Home COVID test done yesterday was Negative.  Negative home COVID test yesterday.   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/11/2015   Low HDL (under 40) 07/12/2013   History of angioedema 11/08/2012   Paroxysmal atrial flutter ([place]) 02/04/2012   Essential hypertension 06/15/2009   BRONCHOSPASM 06/14/2009   Social History   Tobacco Use   Smoking status: Former    Types: Cigarettes, Pipe    Quit date: 10/05/1996    Years since quitting: 25.2   Smokeless tobacco: Former    Types: Chew   Tobacco comments:    Occasionally  Substance Use Topics   Alcohol use: No    Alcohol/week: 0.0 standard drinks    Current Outpatient Medications:    apixaban (ELIQUIS) 5 MG TABS tablet, Take 1 tablet (5 mg total) by mouth 2 (two) times daily., Disp: 180 tablet, Rfl: 3   cetirizine (ZYRTEC) 10 MG tablet, Take 10 mg by mouth  daily., Disp: , Rfl:    chlorthalidone (HYGROTON) 25 MG tablet, Take 1 tablet (25 mg total) by mouth daily., Disp: 90 tablet, Rfl: 3   diltiazem (CARDIZEM CD) 300 MG 24 hr capsule, Take 1 capsule (300 mg total) by mouth daily., Disp: 90 capsule, Rfl: 3   metFORMIN (GLUCOPHAGE-XR) 500 MG 24 hr tablet, Take 2 tablets (1,000 mg total) by mouth 2 (two) times daily., Disp: 360 tablet, Rfl: 3   metoprolol succinate (TOPROL-XL) 50 MG 24 hr tablet, Take 1 tablet (50 mg total) by mouth daily. Take with or immediately following a meal., Disp: 90 tablet, Rfl: 3   pravastatin (PRAVACHOL) 20 MG tablet, Take 20 mg by mouth daily., Disp: , Rfl:  Allergies  Allergen Reactions   Ibuprofen Swelling   Lisinopril Swelling    Swelling of face and lips    Oxaprozin Swelling    Swelling of face and lips   Ziac [Bisoprolol-Hydrochlorothiazide] Swelling    Per patient records from Dr. Florina Ou.   Bystolic [Nebivolol Hcl] Itching   Hctz [Hydrochlorothiazide] Swelling   Norvasc [Amlodipine Besylate]     Unknown     Assessment & Plan:   1. Flu-like symptoms   No red flags Rapid flu test is negative COVID PCR pending Suspect viral URI Push fluids, rest, maintain adequate nutrition If new/worsening symptoms, advised to reach out to our office or seek in person care   Orders Placed  This Encounter  Procedures   Novel Coronavirus, NAA ([facility])   POCT Influenza A/B   No orders of the defined types were placed in this encounter.   Inda Coke, [place] 12/27/2021  Time spent with the patient: 7 minutes, spent in obtaining information about his symptoms, reviewing his previous labs, evaluations, and treatments, counseling him about his condition (please see the discussed topics above), and developing a plan to further investigate it; he had a number of questions which I addressed.

## 2021-12-28 LAB — NOVEL CORONAVIRUS, NAA: SARS-CoV-2, NAA: NOT DETECTED

## 2021-12-28 LAB — SARS-COV-2, NAA 2 DAY TAT

## 2022-02-17 DIAGNOSIS — F4320 Adjustment disorder, unspecified: Secondary | ICD-10-CM | POA: Diagnosis not present

## 2022-02-17 DIAGNOSIS — R69 Illness, unspecified: Secondary | ICD-10-CM | POA: Diagnosis not present

## 2022-02-24 ENCOUNTER — Encounter: Payer: Self-pay | Admitting: Gastroenterology

## 2022-02-25 DIAGNOSIS — F4320 Adjustment disorder, unspecified: Secondary | ICD-10-CM | POA: Diagnosis not present

## 2022-02-25 DIAGNOSIS — R69 Illness, unspecified: Secondary | ICD-10-CM | POA: Diagnosis not present

## 2022-03-16 NOTE — Progress Notes (Addendum)
Therapist, music at [facility] ?[address], [address] ?[place], [place] [address] ?336 [phone] ?Fax [phone] ? ?Date:  03/20/2022  ? ?Name:  [name].   DOB:  10/10/1946   MRN:  [id number] ? ?PCP:  Darreld Mclean, MD  ? ? ?Chief Complaint: 6 month follow up (Concerns/ questions: pt now takes Metformin 1 tab BID. /) ? ? ?History of Present Illness: ? ?[name]. is a 76 y.o. very pleasant male patient who presents with the following: ? ?Here today for a 6 month follow-up visit ?Last seen by myself in October - history of atrial fibrillation, hypertension, diabetes, obstructive sleep apnea ? 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 He has a lot of family members working there, he enjoys the work  ? ?Shingrix-recommended ?Eye exam just done late October - all was ok per his report  ?Needs urine micro today  ?He is fasting today  ? ?He decreased his metformin to 500 BID as his glucose was running a bit low-about 100.  ? ?He is working 3-4 hours a day still, no plans to retire ?He is seeing cardiology annually now  ? ?He notes he is seen an alternative medicine doctor of some sort who is prescribing testosterone for him.  He notes his most recent level was approximately 800.  I did advise patient that caution is necessary, regular blood work is very important to monitor his PSA, lipids, CBC.  Of note I have not recommended testosterone replacement for this patient myself; most recent level in my office low normal as would be expected for age ? ?Lab Results  ?Component Value Date  ? PSA 0.73 10/01/2021  ? PSA 0.73 04/03/2021  ? PSA 0.85 05/23/2019  ? ? ? ? ?Patient Active Problem List  ? Diagnosis Date Noted  ? Typical atrial flutter ([place])   ? Anticoagulated 02/15/2020  ? Sleep apnea 01/24/2020  ? Controlled type 2 diabetes mellitus without complication, without long-term current  use of insulin ([place]) 04/02/2017  ? Left hip pain 02/25/2016  ? CMV infection, acute ([place]) 09/11/2015  ? Low HDL (under 40) 07/12/2013  ? History of angioedema 11/08/2012  ? Paroxysmal atrial flutter ([place]) 02/04/2012  ? Essential hypertension 06/15/2009  ? BRONCHOSPASM 06/14/2009  ? ? ?Past Medical History:  ?Diagnosis Date  ? Allergic angioedema   ? Allergy   ? Hypertension   ? Non-insulin dependent type 2 diabetes mellitus ([place])   ? PAF (paroxysmal atrial fibrillation) ([place])   ? Pneumonia   ? RAD (reactive airway disease)   ? Sleep apnea   ? C-pap intolerant  ? Tick bite   ? ? ?Past Surgical History:  ?Procedure Laterality Date  ? APPENDECTOMY    ? CARDIOVERSION N/A 02/21/2020  ? Procedure: CARDIOVERSION;  Surgeon: Skeet Latch, MD;  Location: [facility];  Service: Cardiovascular;  Laterality: N/A;  ? CHOLECYSTECTOMY    ? COLONOSCOPY    ? stress myoview  12/13/2008  ? EF 61% LV  normal  ? TONSILLECTOMY    ? ? ?Social History  ? ?Tobacco Use  ? Smoking status: Former  ?  Types: Cigarettes, Pipe  ?  Quit date: 10/05/1996  ?  Years since quitting: 25.4  ? Smokeless tobacco: Former  ?  Types: Chew  ? Tobacco comments:  ?  Occasionally  ?Substance Use Topics  ?  Alcohol use: No  ?  Alcohol/week: 0.0 standard drinks  ? Drug use: No  ? ? ?Family History  ?Problem Relation Age of Onset  ? Pancreatic cancer Mother   ? Hypertension Mother   ? Cancer Mother   ? Hypertension Sister   ? Asthma Son   ? Stroke Father   ? Hypertension Father   ? Hypertension Brother   ? Hypertension Daughter   ? Cancer Maternal Grandfather   ? Colon polyps Neg Hx   ? Esophageal cancer Neg Hx   ? Rectal cancer Neg Hx   ? Stomach cancer Neg Hx   ? ? ?Allergies  ?Allergen Reactions  ? Ibuprofen Swelling  ? Lisinopril Swelling  ?  Swelling of face and lips ?  ? Oxaprozin Swelling  ?  Swelling of face and lips  ? Ziac [Bisoprolol-Hydrochlorothiazide] Swelling  ?  Per patient records from Dr. Florina Ou.  ? Bystolic [Nebivolol Hcl] Itching  ? Hctz  [Hydrochlorothiazide] Swelling  ? Norvasc [Amlodipine Besylate]   ?  Unknown   ? ? ?Medication list has been reviewed and updated. ? ?Current Outpatient Medications on File Prior to Visit  ?Medication Sig Dispense Refill  ? apixaban (ELIQUIS) 5 MG TABS tablet Take 1 tablet (5 mg total) by mouth 2 (two) times daily. 180 tablet 3  ? cetirizine (ZYRTEC) 10 MG tablet Take 10 mg by mouth daily.    ? chlorthalidone (HYGROTON) 25 MG tablet Take 1 tablet (25 mg total) by mouth daily. 90 tablet 3  ? diltiazem (CARDIZEM CD) 300 MG 24 hr capsule Take 1 capsule (300 mg total) by mouth daily. 90 capsule 3  ? metFORMIN (GLUCOPHAGE-XR) 500 MG 24 hr tablet Take 2 tablets (1,000 mg total) by mouth 2 (two) times daily. (Patient taking differently: Take 1,000 mg by mouth 2 (two) times daily. Taking 1 tab BID) 360 tablet 3  ? metoprolol succinate (TOPROL-XL) 50 MG 24 hr tablet TAKE 1 TABLET BY MOUTH DAILY. TAKE WITH OR IMMEDIATELY FOLLOWING A MEAL. 90 tablet 3  ? pravastatin (PRAVACHOL) 20 MG tablet Take 20 mg by mouth daily.    ? ?No current facility-administered medications on file prior to visit.  ? ? ?Review of Systems: ? ?As per HPI- otherwise negative. ? ? ?Physical Examination: ?Vitals:  ? 03/20/22 1026  ?BP: 130/80  ?Pulse: 73  ?Resp: 18  ?Temp: 98.2 ?F (36.8 ?C)  ?SpO2: 98%  ? ?Vitals:  ? 03/20/22 1026  ?Weight: 247 lb (112 kg)  ?Height: '6\' 2"'$  (1.88 m)  ? ?Body mass index is 31.71 kg/m?. ?Ideal Body Weight: Weight in (lb) to have BMI = 25: 194.3 ? ?GEN: no acute distress.  Mild overweight, tall build.  Looks well ?HEENT: Atraumatic, Normocephalic.  ?Ears and Nose: No external deformity. ?CV: RRR, No M/G/R. No JVD. No thrill. No extra heart sounds. ?PULM: CTA B, no wheezes, crackles, rhonchi. No retractions. No resp. distress. No accessory muscle use. ?ABD: S, NT, ND, +BS. No rebound. No HSM. ?EXTR: No c/c/e ?PSYCH: Normally interactive. Conversant.  ? ? ?Assessment and Plan: ?Essential hypertension - Plan: Comprehensive  metabolic panel, CBC ? ?Controlled type 2 diabetes mellitus without complication, without long-term current use of insulin ([place]) - Plan: Hemoglobin A1c, Microalbumin / creatinine urine ratio ? ?Paroxysmal atrial flutter ([place]) ? ?Dyslipidemia - Plan: Lipid panel ? ?Patient seen today for follow-up.  Blood pressure under good control on current regimen, continue metoprolol, diltiazem, chlorthalidone ?Patient noted improved blood sugar, he decreased his metformin to 500  mg twice daily.  Check A1c today ?In sinus rhythm today.  He takes Eliquis regularly ?On Pravachol, check lipids ?Will plan further follow- up pending labs. ? ? ?Signed ?Lamar Blinks, MD ? ?Received labs as below, message to patient ?Results for orders placed or performed in visit on 03/20/22  ?Comprehensive metabolic panel  ?Result Value Ref Range  ? Sodium 135 135 - 145 mEq/L  ? Potassium 4.1 3.5 - 5.1 mEq/L  ? Chloride 98 96 - 112 mEq/L  ? CO2 29 19 - 32 mEq/L  ? Glucose, Bld 164 (H) 70 - 99 mg/dL  ? BUN 23 6 - 23 mg/dL  ? Creatinine, Ser 1.10 0.40 - 1.50 mg/dL  ? Total Bilirubin 0.6 0.2 - 1.2 mg/dL  ? Alkaline Phosphatase 50 39 - 117 U/L  ? AST 19 0 - 37 U/L  ? ALT 15 0 - 53 U/L  ? Total Protein 6.9 6.0 - 8.3 g/dL  ? Albumin 4.3 3.5 - 5.2 g/dL  ? GFR 66.66 >60.00 mL/min  ? Calcium 9.7 8.4 - 10.5 mg/dL  ?Hemoglobin A1c  ?Result Value Ref Range  ? Hgb A1c MFr Bld 7.1 (H) 4.6 - 6.5 %  ?Microalbumin / creatinine urine ratio  ?Result Value Ref Range  ? Microalb, Ur 2.3 (H) 0.0 - 1.9 mg/dL  ? Creatinine,U 43.3 mg/dL  ? Microalb Creat Ratio 5.3 0.0 - 30.0 mg/g  ?CBC  ?Result Value Ref Range  ? WBC 7.3 4.0 - 10.5 K/uL  ? RBC 5.30 4.22 - 5.81 Mil/uL  ? Platelets 288.0 150.0 - 400.0 K/uL  ? Hemoglobin 15.6 13.0 - 17.0 g/dL  ? HCT 45.3 39.0 - 52.0 %  ? MCV 85.3 78.0 - 100.0 fl  ? MCHC 34.4 30.0 - 36.0 g/dL  ? RDW 14.2 11.5 - 15.5 %  ?Lipid panel  ?Result Value Ref Range  ? Cholesterol 90 0 - 200 mg/dL  ? Triglycerides 75.0 0.0 - 149.0 mg/dL  ? HDL 31.10 (L)  >39.00 mg/dL  ? VLDL 15.0 0.0 - 40.0 mg/dL  ? LDL Cholesterol 44 0 - 99 mg/dL  ? Total CHOL/HDL Ratio 3   ? NonHDL 59.24   ? ? ? ?

## 2022-03-17 DIAGNOSIS — R69 Illness, unspecified: Secondary | ICD-10-CM | POA: Diagnosis not present

## 2022-03-17 DIAGNOSIS — F4320 Adjustment disorder, unspecified: Secondary | ICD-10-CM | POA: Diagnosis not present

## 2022-03-19 ENCOUNTER — Other Ambulatory Visit: Payer: Self-pay | Admitting: Family Medicine

## 2022-03-19 DIAGNOSIS — I4892 Unspecified atrial flutter: Secondary | ICD-10-CM

## 2022-03-20 ENCOUNTER — Encounter: Payer: Self-pay | Admitting: Family Medicine

## 2022-03-20 ENCOUNTER — Ambulatory Visit (INDEPENDENT_AMBULATORY_CARE_PROVIDER_SITE_OTHER): Payer: Medicare HMO | Admitting: Family Medicine

## 2022-03-20 VITALS — BP 130/80 | HR 73 | Temp 98.2°F | Resp 18 | Ht 74.0 in | Wt 247.0 lb

## 2022-03-20 DIAGNOSIS — E119 Type 2 diabetes mellitus without complications: Secondary | ICD-10-CM

## 2022-03-20 DIAGNOSIS — E785 Hyperlipidemia, unspecified: Secondary | ICD-10-CM | POA: Diagnosis not present

## 2022-03-20 DIAGNOSIS — I10 Essential (primary) hypertension: Secondary | ICD-10-CM | POA: Diagnosis not present

## 2022-03-20 DIAGNOSIS — I4892 Unspecified atrial flutter: Secondary | ICD-10-CM

## 2022-03-20 LAB — CBC
HCT: 45.3 % (ref 39.0–52.0)
Hemoglobin: 15.6 g/dL (ref 13.0–17.0)
MCHC: 34.4 g/dL (ref 30.0–36.0)
MCV: 85.3 fl (ref 78.0–100.0)
Platelets: 288 10*3/uL (ref 150.0–400.0)
RBC: 5.3 Mil/uL (ref 4.22–5.81)
RDW: 14.2 % (ref 11.5–15.5)
WBC: 7.3 10*3/uL (ref 4.0–10.5)

## 2022-03-20 LAB — COMPREHENSIVE METABOLIC PANEL
ALT: 15 U/L (ref 0–53)
AST: 19 U/L (ref 0–37)
Albumin: 4.3 g/dL (ref 3.5–5.2)
Alkaline Phosphatase: 50 U/L (ref 39–117)
BUN: 23 mg/dL (ref 6–23)
CO2: 29 mEq/L (ref 19–32)
Calcium: 9.7 mg/dL (ref 8.4–10.5)
Chloride: 98 mEq/L (ref 96–112)
Creatinine, Ser: 1.1 mg/dL (ref 0.40–1.50)
GFR: 66.66 mL/min (ref >60.00)
Glucose, Bld: 164 mg/dL — ABNORMAL HIGH (ref 70–99)
Potassium: 4.1 mEq/L (ref 3.5–5.1)
Sodium: 135 mEq/L (ref 135–145)
Total Bilirubin: 0.6 mg/dL (ref 0.2–1.2)
Total Protein: 6.9 g/dL (ref 6.0–8.3)

## 2022-03-20 LAB — HEMOGLOBIN A1C: Hgb A1c MFr Bld: 7.1 % — ABNORMAL HIGH (ref 4.6–6.5)

## 2022-03-20 LAB — LIPID PANEL
Cholesterol: 90 mg/dL (ref 0–200)
HDL: 31.1 mg/dL — ABNORMAL LOW (ref >39.00)
LDL Cholesterol: 44 mg/dL (ref 0–99)
NonHDL: 59.24
Total CHOL/HDL Ratio: 3
Triglycerides: 75 mg/dL (ref 0.0–149.0)
VLDL: 15 mg/dL (ref 0.0–40.0)

## 2022-03-20 LAB — MICROALBUMIN / CREATININE URINE RATIO
Creatinine,U: 43.3 mg/dL
Microalb Creat Ratio: 5.3 mg/g (ref 0.0–30.0)
Microalb, Ur: 2.3 mg/dL — ABNORMAL HIGH (ref 0.0–1.9)

## 2022-03-20 NOTE — Patient Instructions (Addendum)
It was good to see you today- I am glad you are feeling well ?We will check your labs for you today-  ?If not done already consider getting the shingles vaccine (shingrix) at your pharmacy  ? ?Assuming all is ok please see me in about 6 months  ?

## 2022-03-26 ENCOUNTER — Ambulatory Visit: Payer: Medicare HMO | Admitting: Family Medicine

## 2022-04-02 ENCOUNTER — Other Ambulatory Visit: Payer: Self-pay | Admitting: Family Medicine

## 2022-04-02 DIAGNOSIS — I10 Essential (primary) hypertension: Secondary | ICD-10-CM

## 2022-04-02 DIAGNOSIS — E785 Hyperlipidemia, unspecified: Secondary | ICD-10-CM

## 2022-04-02 DIAGNOSIS — I4892 Unspecified atrial flutter: Secondary | ICD-10-CM

## 2022-04-11 ENCOUNTER — Other Ambulatory Visit: Payer: Self-pay | Admitting: Family Medicine

## 2022-04-11 DIAGNOSIS — E786 Lipoprotein deficiency: Secondary | ICD-10-CM

## 2022-04-11 DIAGNOSIS — R739 Hyperglycemia, unspecified: Secondary | ICD-10-CM

## 2022-04-22 DIAGNOSIS — F4320 Adjustment disorder, unspecified: Secondary | ICD-10-CM | POA: Diagnosis not present

## 2022-04-22 DIAGNOSIS — R69 Illness, unspecified: Secondary | ICD-10-CM | POA: Diagnosis not present

## 2022-06-09 DIAGNOSIS — R69 Illness, unspecified: Secondary | ICD-10-CM | POA: Diagnosis not present

## 2022-06-09 DIAGNOSIS — F4320 Adjustment disorder, unspecified: Secondary | ICD-10-CM | POA: Diagnosis not present

## 2022-06-09 DIAGNOSIS — Z630 Problems in relationship with spouse or partner: Secondary | ICD-10-CM | POA: Diagnosis not present

## 2022-06-24 DIAGNOSIS — Z630 Problems in relationship with spouse or partner: Secondary | ICD-10-CM | POA: Diagnosis not present

## 2022-06-24 DIAGNOSIS — F4320 Adjustment disorder, unspecified: Secondary | ICD-10-CM | POA: Diagnosis not present

## 2022-06-24 DIAGNOSIS — R69 Illness, unspecified: Secondary | ICD-10-CM | POA: Diagnosis not present

## 2022-06-30 ENCOUNTER — Other Ambulatory Visit (HOSPITAL_BASED_OUTPATIENT_CLINIC_OR_DEPARTMENT_OTHER): Payer: Self-pay

## 2022-06-30 DIAGNOSIS — F4320 Adjustment disorder, unspecified: Secondary | ICD-10-CM | POA: Diagnosis not present

## 2022-06-30 DIAGNOSIS — Z630 Problems in relationship with spouse or partner: Secondary | ICD-10-CM | POA: Diagnosis not present

## 2022-06-30 DIAGNOSIS — R69 Illness, unspecified: Secondary | ICD-10-CM | POA: Diagnosis not present

## 2022-07-07 DIAGNOSIS — Z630 Problems in relationship with spouse or partner: Secondary | ICD-10-CM | POA: Diagnosis not present

## 2022-07-07 DIAGNOSIS — R69 Illness, unspecified: Secondary | ICD-10-CM | POA: Diagnosis not present

## 2022-07-07 DIAGNOSIS — F4320 Adjustment disorder, unspecified: Secondary | ICD-10-CM | POA: Diagnosis not present

## 2022-07-14 DIAGNOSIS — R69 Illness, unspecified: Secondary | ICD-10-CM | POA: Diagnosis not present

## 2022-07-14 DIAGNOSIS — Z630 Problems in relationship with spouse or partner: Secondary | ICD-10-CM | POA: Diagnosis not present

## 2022-07-14 DIAGNOSIS — F4320 Adjustment disorder, unspecified: Secondary | ICD-10-CM | POA: Diagnosis not present

## 2022-07-24 DIAGNOSIS — Z630 Problems in relationship with spouse or partner: Secondary | ICD-10-CM | POA: Diagnosis not present

## 2022-07-24 DIAGNOSIS — R69 Illness, unspecified: Secondary | ICD-10-CM | POA: Diagnosis not present

## 2022-07-24 DIAGNOSIS — F4320 Adjustment disorder, unspecified: Secondary | ICD-10-CM | POA: Diagnosis not present

## 2022-08-05 DIAGNOSIS — F4320 Adjustment disorder, unspecified: Secondary | ICD-10-CM | POA: Diagnosis not present

## 2022-08-05 DIAGNOSIS — R69 Illness, unspecified: Secondary | ICD-10-CM | POA: Diagnosis not present

## 2022-08-05 DIAGNOSIS — Z630 Problems in relationship with spouse or partner: Secondary | ICD-10-CM | POA: Diagnosis not present

## 2022-08-21 DIAGNOSIS — R69 Illness, unspecified: Secondary | ICD-10-CM | POA: Diagnosis not present

## 2022-08-21 DIAGNOSIS — Z630 Problems in relationship with spouse or partner: Secondary | ICD-10-CM | POA: Diagnosis not present

## 2022-08-21 DIAGNOSIS — F4320 Adjustment disorder, unspecified: Secondary | ICD-10-CM | POA: Diagnosis not present

## 2022-09-04 DIAGNOSIS — Z630 Problems in relationship with spouse or partner: Secondary | ICD-10-CM | POA: Diagnosis not present

## 2022-09-04 DIAGNOSIS — F4320 Adjustment disorder, unspecified: Secondary | ICD-10-CM | POA: Diagnosis not present

## 2022-09-04 DIAGNOSIS — R69 Illness, unspecified: Secondary | ICD-10-CM | POA: Diagnosis not present

## 2022-09-10 ENCOUNTER — Telehealth (INDEPENDENT_AMBULATORY_CARE_PROVIDER_SITE_OTHER): Payer: Medicare HMO | Admitting: Family Medicine

## 2022-09-10 DIAGNOSIS — U071 COVID-19: Secondary | ICD-10-CM | POA: Diagnosis not present

## 2022-09-10 MED ORDER — MOLNUPIRAVIR EUA 200MG CAPSULE: 4.0000 | ORAL_CAPSULE | Freq: Two times a day (BID) | ORAL | 0 refills | Status: AC

## 2022-09-10 NOTE — Telephone Encounter (Signed)
Pt called stating he just tested Covid +.  Symptoms started yesterday afternoon to include head cold-like issues, cough, just didn't feel good.  Thought it was allergies.  Took OTC meds.  Please advise.

## 2022-09-10 NOTE — Telephone Encounter (Signed)
See below

## 2022-09-10 NOTE — Telephone Encounter (Signed)
Called patient back- he gives consent for a phone visit today.  Patient location is home, my location is office.  Patient identity from with 2 factors, he and myself are on the phone call today. He notes sx of illness- like a head cold which darted yesterday.  Did not seem severe- more like allergies He is feeling a bit worse today- he notes a cough No major body aches Head congestion/ headache Mild ST No fever he is aware of , no GI symptoms   He had covid and all his boosters until most recent shot  He does take Eliquis for history of atrial fibrillation.  Advised patient that Paxlovid should typically not be used with Eliquis In this case, we will use molnupiravir.  Advised patient that in my opinion the increased stroke risk with discontinuing or reducing Eliquis outweighs potential increased benefit from Paxlovid  Discussed isolation and masking.  Encouraged rest and fluids.  Patient is asked to please let me know if he is not getting better soon, sooner if he is getting worse  Spoke with patient for 6 minutes on the telephone today

## 2022-09-18 DIAGNOSIS — R69 Illness, unspecified: Secondary | ICD-10-CM | POA: Diagnosis not present

## 2022-09-18 DIAGNOSIS — Z630 Problems in relationship with spouse or partner: Secondary | ICD-10-CM | POA: Diagnosis not present

## 2022-09-18 DIAGNOSIS — F4320 Adjustment disorder, unspecified: Secondary | ICD-10-CM | POA: Diagnosis not present

## 2022-09-22 ENCOUNTER — Encounter: Payer: Self-pay | Admitting: Family Medicine

## 2022-09-22 ENCOUNTER — Ambulatory Visit (INDEPENDENT_AMBULATORY_CARE_PROVIDER_SITE_OTHER): Payer: Medicare HMO | Admitting: Family Medicine

## 2022-09-22 ENCOUNTER — Ambulatory Visit (HOSPITAL_BASED_OUTPATIENT_CLINIC_OR_DEPARTMENT_OTHER)
Admission: RE | Admit: 2022-09-22 | Discharge: 2022-09-22 | Disposition: A | Discharge status: Home / Self Care | Payer: Self-pay | Source: Ambulatory Visit | Attending: Family Medicine | Admitting: Family Medicine

## 2022-09-22 VITALS — BP 140/82 | HR 65 | Temp 98.0°F | Resp 18 | Ht 74.0 in | Wt 246.8 lb

## 2022-09-22 DIAGNOSIS — Z23 Encounter for immunization: Secondary | ICD-10-CM

## 2022-09-22 DIAGNOSIS — E119 Type 2 diabetes mellitus without complications: Secondary | ICD-10-CM | POA: Insufficient documentation

## 2022-09-22 DIAGNOSIS — Z0000 Encounter for general adult medical examination without abnormal findings: Secondary | ICD-10-CM | POA: Diagnosis not present

## 2022-09-22 DIAGNOSIS — R972 Elevated prostate specific antigen [PSA]: Secondary | ICD-10-CM

## 2022-09-22 DIAGNOSIS — Z5181 Encounter for therapeutic drug level monitoring: Secondary | ICD-10-CM | POA: Diagnosis not present

## 2022-09-22 DIAGNOSIS — E785 Hyperlipidemia, unspecified: Secondary | ICD-10-CM | POA: Insufficient documentation

## 2022-09-22 DIAGNOSIS — I10 Essential (primary) hypertension: Secondary | ICD-10-CM

## 2022-09-22 DIAGNOSIS — Z125 Encounter for screening for malignant neoplasm of prostate: Secondary | ICD-10-CM | POA: Diagnosis not present

## 2022-09-22 DIAGNOSIS — I4892 Unspecified atrial flutter: Secondary | ICD-10-CM | POA: Diagnosis not present

## 2022-09-22 LAB — PSA: PSA: 1.53 ng/mL (ref 0.10–4.00)

## 2022-09-22 LAB — CBC
HCT: 48.9 % (ref 39.0–52.0)
Hemoglobin: 17.1 g/dL — ABNORMAL HIGH (ref 13.0–17.0)
MCHC: 35.1 g/dL (ref 30.0–36.0)
MCV: 85.8 fl (ref 78.0–100.0)
Platelets: 311 10*3/uL (ref 150.0–400.0)
RBC: 5.69 Mil/uL (ref 4.22–5.81)
RDW: 14.4 % (ref 11.5–15.5)
WBC: 8.2 10*3/uL (ref 4.0–10.5)

## 2022-09-22 LAB — COMPREHENSIVE METABOLIC PANEL
ALT: 15 U/L (ref 0–53)
AST: 17 U/L (ref 0–37)
Albumin: 4 g/dL (ref 3.5–5.2)
Alkaline Phosphatase: 52 U/L (ref 39–117)
BUN: 19 mg/dL (ref 6–23)
CO2: 32 mEq/L (ref 19–32)
Calcium: 9.7 mg/dL (ref 8.4–10.5)
Chloride: 94 mEq/L — ABNORMAL LOW (ref 96–112)
Creatinine, Ser: 1.21 mg/dL (ref 0.40–1.50)
GFR: 59.25 mL/min — ABNORMAL LOW (ref >60.00)
Glucose, Bld: 174 mg/dL — ABNORMAL HIGH (ref 70–99)
Potassium: 4.4 mEq/L (ref 3.5–5.1)
Sodium: 134 mEq/L — ABNORMAL LOW (ref 135–145)
Total Bilirubin: 0.8 mg/dL (ref 0.2–1.2)
Total Protein: 7.1 g/dL (ref 6.0–8.3)

## 2022-09-22 LAB — HEMOGLOBIN A1C: Hgb A1c MFr Bld: 7.9 % — ABNORMAL HIGH (ref 4.6–6.5)

## 2022-09-22 LAB — LIPID PANEL
Cholesterol: 132 mg/dL (ref 0–200)
HDL: 31.6 mg/dL — ABNORMAL LOW (ref >39.00)
LDL Cholesterol: 68 mg/dL (ref 0–99)
NonHDL: 100.14
Total CHOL/HDL Ratio: 4
Triglycerides: 163 mg/dL — ABNORMAL HIGH (ref 0.0–149.0)
VLDL: 32.6 mg/dL (ref 0.0–40.0)

## 2022-09-22 NOTE — Patient Instructions (Addendum)
Good to see you again today- take care and please see me in about 6 months assuming all is well Recommend covid booster and RSV this fall I would also recommend getting the shingles vaccine at your pharmacy  I will reach out to Dr Havery Moros and let him know you are interested in a colonoscopy  I ordered your CT coronary scan as well

## 2022-09-22 NOTE — Progress Notes (Signed)
[facility] [address] [place], [place] [address] [place] [id number] [phone]  Date:  09/22/2022   Name:  [name].   DOB:  [birth year]   MRN:  [id number]  PCP:  Darreld Mclean, MD    Chief Complaint: 6 month follow up (Concerns/ questions: none/Flu shot today: yes/Eye exam: [employer]/Foot exam due today)   History of Present Illness:  [name]. is a 76 y.o. 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 He has a lot of family members working there, he enjoys the work  He is still working a few hours a day, as he wishes   Shingrix- recommended  Eye exam; this was done less than a year ago.  He went to [facility] at [facility]  Colon cancer screening; suggested that he do this, he was not sure -after discussion he would like to go ahead Recommend covid booster Flu shot - give today  Foot exam due  Update A1c today Lab Results  Component Value Date   HGBA1C 7.1 (H) 03/20/2022   Chlorthalidone Dilt 300 Eliquis Metformin XR 1000 BID Toprol xl 50 Pravachol  No chest pain or shortness of breath with exercise We discussed getting a coronary CT, he is interested  Patient Active Problem List   Diagnosis Date Noted   Typical atrial flutter ([place])    Anticoagulated 02/15/2020   Sleep apnea 01/24/2020   Controlled type 2 diabetes mellitus without complication, without long-term current use of insulin ([place]) 04/02/2017   Left hip pain 02/25/2016   CMV infection, acute ([place]) 09/11/2015   Low HDL (under 40) 07/12/2013   History of angioedema 11/08/2012   Paroxysmal atrial flutter ([place]) 02/04/2012    Essential hypertension 06/15/2009   BRONCHOSPASM 06/14/2009    Past Medical History:  Diagnosis Date   Allergic angioedema    Allergy    Hypertension    Non-insulin dependent type 2 diabetes mellitus (HCC)    PAF (paroxysmal atrial fibrillation) (HCC)    Pneumonia    RAD (reactive airway disease)    Sleep apnea    C-pap intolerant   Tick bite     Past Surgical History:  Procedure Laterality Date   APPENDECTOMY     CARDIOVERSION N/A 02/21/2020   Procedure: CARDIOVERSION;  Surgeon: Skeet Latch, MD;  Location: [facility];  Service: Cardiovascular;  Laterality: N/A;   CHOLECYSTECTOMY     COLONOSCOPY     stress myoview  12/13/2008   EF 61% LV  normal   TONSILLECTOMY      Social History   Tobacco Use   Smoking status: Former    Types: Cigarettes, Pipe    Quit date: 10/05/1996    Years since quitting: 25.9   Smokeless tobacco: Former    Types: Chew   Tobacco comments:    Occasionally  Substance Use Topics   Alcohol use: No    Alcohol/week: 0.0 standard drinks of alcohol   Drug use: No    Family History  Problem Relation Age of Onset   Pancreatic cancer Mother    Hypertension Mother    Cancer Mother    Hypertension Sister  Asthma Son    Stroke Father    Hypertension Father    Hypertension Brother    Hypertension Daughter    Cancer Maternal Grandfather    Colon polyps Neg Hx    Esophageal cancer Neg Hx    Rectal cancer Neg Hx    Stomach cancer Neg Hx     Allergies  Allergen Reactions   Ibuprofen Swelling   Lisinopril Swelling    Swelling of face and lips    Oxaprozin Swelling    Swelling of face and lips   Ziac [Bisoprolol-Hydrochlorothiazide] Swelling    Per patient records from Dr. Florina Ou.   Bystolic [Nebivolol Hcl] Itching   Hctz [Hydrochlorothiazide] Swelling   Norvasc [Amlodipine Besylate]     Unknown     Medication list has been reviewed and updated.  Current Outpatient Medications on File Prior to Visit  Medication Sig  Dispense Refill   cetirizine (ZYRTEC) 10 MG tablet Take 10 mg by mouth daily.     chlorthalidone (HYGROTON) 25 MG tablet TAKE 1 TABLET (25 MG TOTAL) BY MOUTH DAILY. 90 tablet 3   diltiazem (CARDIZEM CD) 300 MG 24 hr capsule TAKE 1 CAPSULE BY MOUTH EVERY DAY 90 capsule 3   ELIQUIS 5 MG TABS tablet TAKE 1 TABLET BY MOUTH TWICE A DAY 180 tablet 3   metFORMIN (GLUCOPHAGE-XR) 500 MG 24 hr tablet TAKE 2 TABLETS BY MOUTH TWICE A DAY 360 tablet 3   metoprolol succinate (TOPROL-XL) 50 MG 24 hr tablet TAKE 1 TABLET BY MOUTH DAILY. TAKE WITH OR IMMEDIATELY FOLLOWING A MEAL. 90 tablet 3   pravastatin (PRAVACHOL) 20 MG tablet TAKE 1 TABLET BY MOUTH EVERY DAY 90 tablet 3   No current facility-administered medications on file prior to visit.    Review of Systems:  As per HPI- otherwise negative.   Physical Examination: Vitals:   09/22/22 0814  BP: (!) 140/82  Pulse: 65  Resp: 18  Temp: 98 F (36.7 C)  SpO2: 96%   Vitals:   09/22/22 0814  Weight: 246 lb 12.8 oz (111.9 kg)  Height: '6\' 2"'$  (1.88 m)   Body mass index is 31.69 kg/m. Ideal Body Weight: Weight in (lb) to have BMI = 25: 194.3  GEN: no acute distress.  Robust build, overweight,  looks well  HEENT: Atraumatic, Normocephalic.  Bilateral TM wnl, oropharynx normal.  PEERL,EOMI.   Ears and Nose: No external deformity. CV: RRR, No M/G/R. No JVD. No thrill. No extra heart sounds. PULM: CTA B, no wheezes, crackles, rhonchi. No retractions. No resp. distress. No accessory muscle use. ABD: S, NT, ND, +BS. No rebound. No HSM. EXTR: No c/c/e PSYCH: Normally interactive. Conversant.  Foot exam  completed   Assessment and Plan: Physical exam  Paroxysmal atrial flutter ([place]) - Plan: Comprehensive metabolic panel  Essential hypertension - Plan: CBC, Comprehensive metabolic panel, CT CARDIAC SCORING (SELF PAY ONLY)  Dyslipidemia - Plan: Lipid panel, CT CARDIAC SCORING (SELF PAY ONLY)  Controlled type 2 diabetes mellitus without  complication, without long-term current use of insulin (HCC) - Plan: Hemoglobin A1c, CT CARDIAC SCORING (SELF PAY ONLY)  Medication monitoring encounter  Screening for prostate cancer - Plan: PSA  Need for immunization against influenza - Plan: Flu Vaccine QUAD High Dose(Fluad)  Patient seen today for physical exam.  Recommended continued healthy diet and exercise routine Labs are pending as above Flu shot given, recommend COVID and RSV, Shingrix Message to gastroenterologist about scheduling colonoscopy Ordered coronary CT Signed Lamar Blinks, MD  Received labs as below, message to patient Results for orders placed or performed in visit on 09/22/22  CBC  Result Value Ref Range   WBC 8.2 4.0 - 10.5 K/uL   RBC 5.69 4.22 - 5.81 Mil/uL   Platelets 311.0 150.0 - 400.0 K/uL   Hemoglobin 17.1 (H) 13.0 - 17.0 g/dL   HCT 48.9 39.0 - 52.0 %   MCV 85.8 78.0 - 100.0 fl   MCHC 35.1 30.0 - 36.0 g/dL   RDW 14.4 11.5 - 15.5 %  Comprehensive metabolic panel  Result Value Ref Range   Sodium 134 (L) 135 - 145 mEq/L   Potassium 4.4 3.5 - 5.1 mEq/L   Chloride 94 (L) 96 - 112 mEq/L   CO2 32 19 - 32 mEq/L   Glucose, Bld 174 (H) 70 - 99 mg/dL   BUN 19 6 - 23 mg/dL   Creatinine, Ser 1.21 0.40 - 1.50 mg/dL   Total Bilirubin 0.8 0.2 - 1.2 mg/dL   Alkaline Phosphatase 52 39 - 117 U/L   AST 17 0 - 37 U/L   ALT 15 0 - 53 U/L   Total Protein 7.1 6.0 - 8.3 g/dL   Albumin 4.0 3.5 - 5.2 g/dL   GFR 59.25 (L) >60.00 mL/min   Calcium 9.7 8.4 - 10.5 mg/dL  Hemoglobin A1c  Result Value Ref Range   Hgb A1c MFr Bld 7.9 (H) 4.6 - 6.5 %  Lipid panel  Result Value Ref Range   Cholesterol 132 0 - 200 mg/dL   Triglycerides 163.0 (H) 0.0 - 149.0 mg/dL   HDL 31.60 (L) >39.00 mg/dL   VLDL 32.6 0.0 - 40.0 mg/dL   LDL Cholesterol 68 0 - 99 mg/dL   Total CHOL/HDL Ratio 4    NonHDL 100.14   PSA  Result Value Ref Range   PSA 1.53 0.10 - 4.00 ng/mL

## 2022-09-23 ENCOUNTER — Telehealth: Payer: Self-pay

## 2022-09-23 ENCOUNTER — Encounter: Payer: Self-pay | Admitting: Family Medicine

## 2022-09-23 NOTE — Telephone Encounter (Signed)
Called and spoke with patient. He has been scheduled for a PV on Monday, 10/06/22 at 1:00 pm. Colonoscopy has been scheduled in the [facility] with Dr. Havery Moros on Monday, 10/27/22 at 8:30 am. Pt is aware that he will need to arrive by 7:30 am with a care partner. Pt is currently taking Metformin and Eliquis. Eliquis last prescribed by PCP, pt is aware that we will obtain cardiac clearance request to hold for 2 days prior to his colonoscopy. Pt has been advised that they will review further at the time of his PV appt. Pt is aware that I will send his appt information via MyChart and I will also mail a copy to his address on file. Pt verbalized understanding of all information and had no concerns at the end of the call.   See alternate telephone encounter for cardiac clearance. Appt information sent via MyChart and mailed.

## 2022-09-23 NOTE — Telephone Encounter (Signed)
[name] 1946-06-21 [id number]  Dear Dr. Lorelei Pont:  We have scheduled the above named patient for a colonoscopy procedure. Our records show that he is on anticoagulation therapy.  Please advise as to whether the patient may come off their therapy of Eliquis 2 days prior to their procedure which is scheduled for Monday, 10/27/22.  Please route your response to Gerre Couch, RN or fax response to [phone].  Sincerely,  [facility]

## 2022-09-23 NOTE — Telephone Encounter (Signed)
-----   Message from Yetta Flock, MD sent at 09/22/2022  4:30 PM EDT ----- Regarding: RE: colon  Okay thanks. Yes he is due at this time for history of polyps. Shanon Becvar can you contact the patient for scheduling colonoscopy? I think I do have openings next week if he is interested in getting it done soon. Thanks    ----- Message ----- From: Jared Mclean, MD Sent: 09/22/2022   8:39 AM EDT To: Yetta Flock, MD Subject: colon                                          Hi [name] has decided he would like to go ahead and do a screening colon if you wanted to ask your staff to schedule him. Thank you!   [place]

## 2022-09-24 NOTE — Telephone Encounter (Signed)
[facility] Pre-operative Risk Assessment     Request for surgical clearance:     Endoscopy Procedure  What type of surgery is being performed?     Colonoscopy  When is this surgery scheduled?     10/27/22  What type of clearance is required ?   Pharmacy  Are there any medications that need to be held prior to surgery and how long? Eliquis - 2 days  Practice name and name of physician performing surgery?      [facility]  What is your office phone and fax number?      Phone- [phone]  [phone]  Anesthesia type (None, local, MAC, general) ?       MAC

## 2022-09-25 ENCOUNTER — Encounter: Payer: Self-pay | Admitting: Family Medicine

## 2022-10-01 NOTE — Telephone Encounter (Signed)
   Name: [name].  DOB: October 10, 1946  MRN: [id number]  Primary Cardiologist: Pixie Casino, MD  Chart reviewed as part of pre-operative protocol coverage. Because of Jared HAGEMAN Jr.'s past medical history and time since last visit, he will require a follow-up in-office visit in order to better assess preoperative cardiovascular risk.  Pre-op covering staff: - Please schedule appointment and call patient to inform them. If patient already had an upcoming appointment within acceptable timeframe, please add "pre-op clearance" to the appointment notes so provider is aware. - Please contact requesting surgeon's office via preferred method (i.e, phone, fax) to inform them of need for appointment prior to surgery.  Per office protocol, patient can hold Eliquis for 1 days prior to procedure.   Patient will not need bridging with Lovenox (enoxaparin) around procedure.  Jared Sciara, NP  10/01/2022, 11:34 AM

## 2022-10-01 NOTE — Telephone Encounter (Signed)
Pt scheduled to see Dr. Debara Pickett on 10/13/22 for Preop clearance.

## 2022-10-01 NOTE — Telephone Encounter (Signed)
Patient with diagnosis of atrial fibrillation on Eliquis for anticoagulation.    Procedure: colonoscopy Date of procedure: 10/27/22   CHA2DS2-VASc Score = 3   This indicates a 3.2% annual risk of stroke. The patient's score is based upon: CHF History: 0 HTN History: 1 Diabetes History: 1 Stroke History: 0 Vascular Disease History: 0 Age Score: 1 Gender Score: 0   CrCl 86 Platelet count 311  Per office protocol, patient can hold Eliquis for 1 days prior to procedure.   Patient will not need bridging with Lovenox (enoxaparin) around procedure.  **This guidance is not considered finalized until pre-operative APP has relayed final recommendations.**

## 2022-10-02 DIAGNOSIS — F4320 Adjustment disorder, unspecified: Secondary | ICD-10-CM | POA: Diagnosis not present

## 2022-10-02 DIAGNOSIS — Z630 Problems in relationship with spouse or partner: Secondary | ICD-10-CM | POA: Diagnosis not present

## 2022-10-02 DIAGNOSIS — R69 Illness, unspecified: Secondary | ICD-10-CM | POA: Diagnosis not present

## 2022-10-06 ENCOUNTER — Other Ambulatory Visit: Payer: Self-pay

## 2022-10-06 ENCOUNTER — Ambulatory Visit (AMBULATORY_SURGERY_CENTER): Payer: Self-pay

## 2022-10-06 VITALS — Ht 74.0 in | Wt 249.0 lb

## 2022-10-06 DIAGNOSIS — Z86010 Personal history of colonic polyps: Secondary | ICD-10-CM

## 2022-10-06 MED ORDER — PEG 3350-KCL-NA BICARB-NACL 420 G PO SOLR: 4000.0000 mL | Freq: Once | ORAL | 0 refills | Status: AC

## 2022-10-06 NOTE — Progress Notes (Signed)
Denies allergies to eggs or soy products. Denies complication of anesthesia or sedation. Denies use of weight loss medication. Denies use of O2.   Emmi instructions given for colonoscopy.  

## 2022-10-13 ENCOUNTER — Encounter: Payer: Self-pay | Admitting: Internal Medicine

## 2022-10-13 ENCOUNTER — Ambulatory Visit: Payer: Medicare HMO | Admitting: Internal Medicine

## 2022-10-16 ENCOUNTER — Encounter: Payer: Self-pay | Admitting: Gastroenterology

## 2022-10-16 DIAGNOSIS — Z630 Problems in relationship with spouse or partner: Secondary | ICD-10-CM | POA: Diagnosis not present

## 2022-10-16 DIAGNOSIS — R69 Illness, unspecified: Secondary | ICD-10-CM | POA: Diagnosis not present

## 2022-10-16 DIAGNOSIS — F4320 Adjustment disorder, unspecified: Secondary | ICD-10-CM | POA: Diagnosis not present

## 2022-10-19 ENCOUNTER — Encounter: Payer: Self-pay | Admitting: Certified Registered Nurse Anesthetist

## 2022-10-20 ENCOUNTER — Telehealth: Payer: Self-pay | Admitting: *Deleted

## 2022-10-20 ENCOUNTER — Telehealth: Payer: Self-pay

## 2022-10-20 NOTE — Telephone Encounter (Signed)
F/U pt. Scheduled for procedure on 10/27/22.following up to see if clearance has been obtained for anticoagulant?

## 2022-10-20 NOTE — Telephone Encounter (Signed)
This is the 2nd request. Pt was previously scheduled for an office visit in October but it looks like that appt was cancelled. Cardiology appt is now after scheduled procedure. Please advise, thanks.   [facility] Pre-operative Risk Assessment     Request for surgical clearance:     Endoscopy Procedure  What type of surgery is being performed?     Colonoscopy  When is this surgery scheduled?     10/27/22  What type of clearance is required ?   Pharmacy  Are there any medications that need to be held prior to surgery and how long? Eliquis - 2 days  Practice name and name of physician performing surgery?      [facility]  What is your office phone and fax number?      Phone- [phone]  [phone]  Anesthesia type (None, local, MAC, general) ?       MAC

## 2022-10-20 NOTE — Telephone Encounter (Signed)
Attempted to reach the patient; voicemail box full; no answer from home and mobile numbers.

## 2022-10-20 NOTE — Telephone Encounter (Signed)
Patient with diagnosis of atrial fibrillation on Eliquis for anticoagulation.    Procedure: colonoscopy Date of procedure: 10/27/22   CHA2DS2-VASc Score = 3   This indicates a 3.2% annual risk of stroke. The patient's score is based upon: CHF History: 0 HTN History: 1 Diabetes History: 1 Stroke History: 0 Vascular Disease History: 0 Age Score: 1 Gender Score: 0    CrCl 87 Platelet count 311  Per office protocol, patient can hold eliquis for 2 days prior to procedure.   Patient will not need bridging with Lovenox (enoxaparin) around procedure.  **This guidance is not considered finalized until pre-operative APP has relayed final recommendations.**

## 2022-10-20 NOTE — Telephone Encounter (Signed)
    Primary Cardiologist:Kenneth C Hilty, MD  Chart reviewed as part of pre-operative protocol coverage. Because of Jared CONDE Jr.'s past medical history and time since last visit, he/she will require a follow-up visit in order to better assess preoperative cardiovascular risk.  Pre-op covering staff: - Please schedule appointment and call patient to inform them. - Please contact requesting surgeon's office via preferred method (i.e, phone, fax) to inform them of need for appointment prior to surgery.  If applicable, this message will also be routed to pharmacy pool and/or primary cardiologist for input on holding anticoagulant/antiplatelet agent as requested below so that this information is available at time of patient's appointment.   Pharmacy has addressed anticoagulation request.  Deberah Pelton, NP  10/20/2022, 3:38 PM

## 2022-10-20 NOTE — Telephone Encounter (Signed)
Based on last cardiac clearance request patient was scheduled for an appt for evaluation but that appt was cancelled. New cardiology appt is not until after scheduled procedure. New cardiac clearance request sent. Thanks

## 2022-10-21 NOTE — Telephone Encounter (Signed)
I s/w the pt and he has been scheduled to see Almyra Deforest, [facility] 10/23/22 for pre op clearance. Pt wants to keep his appt 10/28/22 with Dr. Debara Pickett, but agreeable to appt for pre op clearance. Pt wants to discuss alternatives to Eliquis as the cost continues to rise.

## 2022-10-21 NOTE — Telephone Encounter (Signed)
Thank you for the update. He remains scheduled for his procedure with me on 11/13, awaiting your clearance on 11/9

## 2022-10-23 ENCOUNTER — Ambulatory Visit: Payer: Medicare HMO | Attending: Physician Assistant | Admitting: Physician Assistant

## 2022-10-23 ENCOUNTER — Encounter: Payer: Self-pay | Admitting: Physician Assistant

## 2022-10-23 VITALS — BP 136/62 | HR 66 | Ht 74.0 in | Wt 247.4 lb

## 2022-10-23 DIAGNOSIS — E119 Type 2 diabetes mellitus without complications: Secondary | ICD-10-CM

## 2022-10-23 DIAGNOSIS — I10 Essential (primary) hypertension: Secondary | ICD-10-CM | POA: Diagnosis not present

## 2022-10-23 DIAGNOSIS — Z01810 Encounter for preprocedural cardiovascular examination: Secondary | ICD-10-CM | POA: Diagnosis not present

## 2022-10-23 DIAGNOSIS — I4892 Unspecified atrial flutter: Secondary | ICD-10-CM

## 2022-10-23 MED ORDER — DABIGATRAN ETEXILATE MESYLATE 150 MG PO CAPS: 150.0000 mg | ORAL_CAPSULE | Freq: Two times a day (BID) | ORAL | 3 refills | Status: DC

## 2022-10-23 NOTE — Patient Instructions (Signed)
Medication Instructions:  HOLD Eliquis for 2 days prior to procedure  TAKE current Eliquis dose until finished, once completed switch to Pradaxa  START Pradaxa 150 mg 2 times a day  *If you need a refill on your cardiac medications before your next appointment, please call your pharmacy*  Lab Work: NONE ordered at this time of appointment   If you have labs (blood work) drawn today and your tests are completely normal, you will receive your results only by: [place] (if you have MyChart) OR A paper copy in the mail If you have any lab test that is abnormal or we need to change your treatment, we will call you to review the results.  Testing/Procedures: NONE ordered at this time of appointment   Follow-Up: At [facility], you and your health needs are our priority.  As part of our continuing mission to provide you with exceptional heart care, we have created designated Provider Care Teams.  These Care Teams include your primary Cardiologist (physician) and Advanced Practice Providers (APPs -  Physician Assistants and Nurse Practitioners) who all work together to provide you with the care you need, when you need it.  Your next appointment:   1 year(s)  The format for your next appointment:   In Person  Provider:   Pixie Casino, MD     Other Instructions  Important Information About Sugar

## 2022-10-23 NOTE — Progress Notes (Signed)
Cardiology Office Note:    Date:  10/25/2022   ID:  [name]., DOB 03-07-1946, MRN [id number]  PCP:  Darreld Mclean, MD   [facility] Cardiologist:  Pixie Casino, MD     Referring MD: Darreld Mclean, MD   Chief Complaint  Patient presents with   Follow-up    Seen for Dr. Debara Pickett    History of Present Illness:    [name]. is a 76 y.o. male with a hx of atrial flutter, hypertension, DM II, first-degree AV block, obstructive sleep apnea on CPAP therapy.  Patient previously had cardioversion scheduled for atrial flutter, however he spontaneously converted to sinus rhythm.  He takes both diltiazem and beta-blocker for rate control.  Due to low CHA2DS2-Vasc score, he was taken off of Xarelto.  When he returns for follow-up in March 2021, he was noted to be in new atrial fibrillation and was started on Eliquis.  Subsequent cardioversion was successful.  Echocardiogram obtained on 01/26/2020 showed EF 55 to 60%, no significant valve disease.  Patient was last seen by Coletta Memos, NP on 05/01/2021 at which time he was doing well.  Patient presents today for preop clearance prior to upcoming GI procedure.  He has no recent chest pain or worsening dyspnea.  He has no lower extremity edema, orthopnea or PND.  His only concern is the high cost of Eliquis.  He says with his insurance, the Eliquis used to cost more than $200 for 75-monthsupply, now increased to $400 for 373-monthupply.  He only had 1 tablet of Eliquis left.  I gave him 4 boxes of samples which should last him 4 weeks.  He is cleared to proceed with GI procedure after holding Eliquis for 2 days.  He may resume Eliquis as soon as possible afterward at the surgeon's discretion.  I discussed the case with our clinical pharmacist, once he finished the Eliquis on hand, we will switch him to Pradaxa.  I gave him a good Rx co-pay card and a [employer] generic Pradaxa coupon as well.  We have  printed out his Pradaxa prescription for him.  He was started on Pradaxa after his Eliquis is finished.  He is aware not to take both Pradaxa and Eliquis at the same time.  Otherwise, as long as he is doing well, he can follow-up in 1 year.   Past Medical History:  Diagnosis Date   Allergic angioedema    Allergy    Hypertension    Non-insulin dependent type 2 diabetes mellitus (HCC)    PAF (paroxysmal atrial fibrillation) (HCC)    Pneumonia    RAD (reactive airway disease)    Sleep apnea    C-pap intolerant   Tick bite     Past Surgical History:  Procedure Laterality Date   APPENDECTOMY     CARDIOVERSION N/A 02/21/2020   Procedure: CARDIOVERSION;  Surgeon: RaSkeet LatchMD;  [facility];  Laterality: N/A;   CHOLECYSTECTOMY     COLONOSCOPY     stress myoview  12/13/2008   EF 61% LV  normal   TONSILLECTOMY      Current Medications: Current Meds  Medication Sig   cetirizine (ZYRTEC) 10 MG tablet Take 10 mg by mouth daily.   chlorthalidone (HYGROTON) 25 MG tablet TAKE 1 TABLET (25 MG TOTAL) BY MOUTH DAILY.   dabigatran (PRADAXA) 150 MG CAPS capsule Take 1 capsule (150 mg total) by mouth 2 (  two) times daily.   diltiazem (CARDIZEM CD) 300 MG 24 hr capsule TAKE 1 CAPSULE BY MOUTH EVERY DAY   ELIQUIS 5 MG TABS tablet TAKE 1 TABLET BY MOUTH TWICE A DAY   metFORMIN (GLUCOPHAGE-XR) 500 MG 24 hr tablet TAKE 2 TABLETS BY MOUTH TWICE A DAY   metoprolol succinate (TOPROL-XL) 50 MG 24 hr tablet TAKE 1 TABLET BY MOUTH DAILY. TAKE WITH OR IMMEDIATELY FOLLOWING A MEAL.   pravastatin (PRAVACHOL) 20 MG tablet TAKE 1 TABLET BY MOUTH EVERY DAY     Allergies:   Ibuprofen, Lisinopril, Oxaprozin, Ziac [bisoprolol-hydrochlorothiazide], Bystolic [nebivolol hcl], Hctz [hydrochlorothiazide], and Norvasc [amlodipine besylate]   Social History   Socioeconomic History   Marital status: Married    Spouse name: Not on file   Number of children: Not on file    Years of education: Not on file   Highest education level: Not on file  Occupational History   Not on file  Tobacco Use   Smoking status: Former    Types: Cigarettes, Pipe    Quit date: 10/05/1996    Years since quitting: 26.0   Smokeless tobacco: Former    Types: Chew   Tobacco comments:    Occasionally  Substance and Sexual Activity   Alcohol use: No    Alcohol/week: 0.0 standard drinks of alcohol   Drug use: No   Sexual activity: Yes    Birth control/protection: None  Other Topics Concern   Not on file  Social History Narrative   Not on file   Social Determinants of Health   Financial Resource Strain: Low Risk  (10/31/2020)   Overall Financial Resource Strain (CARDIA)    Difficulty of Paying Living Expenses: Not hard at all  Food Insecurity: Not on file  Transportation Needs: Not on file  Physical Activity: Not on file  Stress: Not on file  Social Connections: Not on file     Family History: The patient's family history includes Asthma in his son; Cancer in his maternal grandfather and mother; Hypertension in his brother, daughter, father, mother, and sister; Pancreatic cancer in his mother; Stroke in his father. There is no history of Colon polyps, Esophageal cancer, Rectal cancer, Stomach cancer, or Colon cancer.  ROS:   Please see the history of present illness.     All other systems reviewed and are negative.  EKGs/Labs/Other Studies Reviewed:    The following studies were reviewed today:  Echo 01/26/2020  1. Left ventricular ejection fraction, by estimation, is 55 to 60%. The  left ventricle has normal function. The left ventrical has no regional  wall motion abnormalities. Left ventricular diastolic parameters were  normal.   2. Right ventricular systolic function is normal. The right ventricular  size is normal.   3. No left atrial/left atrial appendage thrombus was detected.   4. The mitral valve is normal in structure and function. no evidence of   mitral valve regurgitation. No evidence of mitral stenosis.   5. The aortic valve is normal in structure and function. Aortic valve  regurgitation is not visualized. No aortic stenosis is present.   6. The inferior vena cava is normal in size with greater than 50%  respiratory variability, suggesting right atrial pressure of 3 mmHg.   EKG:  EKG is ordered today.  The ekg ordered today demonstrates normal sinus rhythm, first-degree AV block  Recent Labs: 09/22/2022: ALT 15; BUN 19; Creatinine, Ser 1.21; Hemoglobin 17.1; Platelets 311.0; Potassium 4.4; Sodium 134  Recent Lipid Panel  Component Value Date/Time   CHOL 132 09/22/2022 0844   TRIG 163.0 (H) 09/22/2022 0844   HDL 31.60 (L) 09/22/2022 0844   CHOLHDL 4 09/22/2022 0844   VLDL 32.6 09/22/2022 0844   LDLCALC 68 09/22/2022 0844     Risk Assessment/Calculations:    CHA2DS2-VASc Score = 3   This indicates a 3.2% annual risk of stroke. The patient's score is based upon: CHF History: 0 HTN History: 1 Diabetes History: 1 Stroke History: 0 Vascular Disease History: 0 Age Score: 1 Gender Score: 0          Physical Exam:    VS:  BP 136/62 (BP Location: Left Arm, Patient Position: Sitting, Cuff Size: Large)   Pulse 66   Ht '6\' 2"'$  (1.88 m)   Wt 247 lb 6.4 oz (112.2 kg)   SpO2 95%   BMI 31.76 kg/m        Wt Readings from Last 3 Encounters:  10/23/22 247 lb 6.4 oz (112.2 kg)  10/06/22 249 lb (112.9 kg)  09/22/22 246 lb 12.8 oz (111.9 kg)     GEN:  Well nourished, well developed in no acute distress HEENT: Normal NECK: No JVD; No carotid bruits LYMPHATICS: No lymphadenopathy CARDIAC: RRR, no murmurs, rubs, gallops RESPIRATORY:  Clear to auscultation without rales, wheezing or rhonchi  ABDOMEN: Soft, non-tender, non-distended MUSCULOSKELETAL:  No edema; No deformity  SKIN: Warm and dry NEUROLOGIC:  Alert and oriented x 3 PSYCHIATRIC:  Normal affect   ASSESSMENT:    1. Preop cardiovascular exam   2.  Paroxysmal atrial flutter (HCC)   3. Essential hypertension   4. Controlled type 2 diabetes mellitus without complication, without long-term current use of insulin (HCC)    PLAN:    In order of problems listed above:  Preoperative clearance: Patient has upcoming GI procedure.  This is considered a very low risk procedure.  Patient does not have history of CAD nor does he have any anginal symptom, he is cleared to proceed from the cardiac perspective after holding Eliquis for 2 days prior to the procedure.  He will need to restart Eliquis as soon as possible afterward at his GI doctors discretion  Paroxysmal atrial flutter: On Eliquis, however the cost of Eliquis has increased to more than $400 for 31-monthsupply.  I have given him 4 weeks of samples, we will transition to Pradaxa afterward.  Hypertension: Blood pressure stable  DM2: Managed by primary care provider.           Medication Adjustments/Labs and Tests Ordered: Current medicines are reviewed at length with the patient today.  Concerns regarding medicines are outlined above.  Orders Placed This Encounter  Procedures   EKG 12-Lead   Meds ordered this encounter  Medications   dabigatran (PRADAXA) 150 MG CAPS capsule    Sig: Take 1 capsule (150 mg total) by mouth 2 (two) times daily.    Dispense:  180 capsule    Refill:  3    Patient Instructions  Medication Instructions:  HOLD Eliquis for 2 days prior to procedure  TAKE current Eliquis dose until finished, once completed switch to Pradaxa  START Pradaxa 150 mg 2 times a day  *If you need a refill on your cardiac medications before your next appointment, please call your pharmacy*  Lab Work: NONE ordered at this time of appointment   If you have labs (blood work) drawn today and your tests are completely normal, you will receive your results only by: [facility](if  you have MyChart) OR A paper copy in the mail If you have any lab test that is abnormal or we  need to change your treatment, we will call you to review the results.  Testing/Procedures: NONE ordered at this time of appointment   Follow-Up: At [facility], you and your health needs are our priority.  As part of our continuing mission to provide you with exceptional heart care, we have created designated Provider Care Teams.  These Care Teams include your primary Cardiologist (physician) and Advanced Practice Providers (APPs -  Physician Assistants and Nurse Practitioners) who all work together to provide you with the care you need, when you need it.  Your next appointment:   1 year(s)  The format for your next appointment:   In Person  Provider:   Pixie Casino, MD     Other Instructions  Important Information About Sugar         Hilbert Corrigan, [place]  10/25/2022 8:30 PM    [place]

## 2022-10-23 NOTE — Telephone Encounter (Signed)
Pt completed pre op appt this morning and has been cleared to hold Eliquis 2 days prior to upcoming procedure. Pt has been notified via [place].

## 2022-10-24 NOTE — Telephone Encounter (Signed)
Patient reviewed MyChart message. Last read by Phyllis Ginger. "Chuck" at [place] AM on 10/24/2022.

## 2022-10-24 NOTE — Telephone Encounter (Signed)
SEE TE

## 2022-10-25 ENCOUNTER — Encounter: Payer: Self-pay | Admitting: Physician Assistant

## 2022-10-25 NOTE — Telephone Encounter (Signed)
Patient was cleared to proceed with upcoming GI procedure.  I have forwarded my office note to the GI physician's office.  He will need to hold Eliquis for 2 days prior to the procedure and restart as once possible afterward at the surgeon's discretion.

## 2022-10-27 ENCOUNTER — Ambulatory Visit (AMBULATORY_SURGERY_CENTER): Payer: Medicare HMO | Admitting: Gastroenterology

## 2022-10-27 ENCOUNTER — Encounter: Payer: Self-pay | Admitting: Gastroenterology

## 2022-10-27 VITALS — BP 117/99 | HR 68 | Temp 98.4°F | Resp 12 | Ht 74.0 in | Wt 249.0 lb

## 2022-10-27 DIAGNOSIS — D123 Benign neoplasm of transverse colon: Secondary | ICD-10-CM

## 2022-10-27 DIAGNOSIS — G4733 Obstructive sleep apnea (adult) (pediatric): Secondary | ICD-10-CM | POA: Diagnosis not present

## 2022-10-27 DIAGNOSIS — Z09 Encounter for follow-up examination after completed treatment for conditions other than malignant neoplasm: Secondary | ICD-10-CM

## 2022-10-27 DIAGNOSIS — I10 Essential (primary) hypertension: Secondary | ICD-10-CM | POA: Diagnosis not present

## 2022-10-27 DIAGNOSIS — Z86010 Personal history of colonic polyps: Secondary | ICD-10-CM | POA: Diagnosis not present

## 2022-10-27 MED ORDER — SODIUM CHLORIDE 0.9 % IV SOLN: 500.0000 mL | Freq: Once | INTRAVENOUS | Status: DC

## 2022-10-27 NOTE — Progress Notes (Signed)
Pt. Able to pass air in trendelenberg, and turning from side to side.  Given instructions to assist in expelling air at home - drink something hot, walking, get on hands and knees.  Pt. Comfortable at time of discharge.  No moaning, grimacing, during time in PACU.

## 2022-10-27 NOTE — Progress Notes (Signed)
Called to room to assist during endoscopic procedure.  Patient ID and intended procedure confirmed with present staff. Received instructions for my participation in the procedure from the performing physician.  

## 2022-10-27 NOTE — Op Note (Addendum)
[facility] Patient Name: [name] Procedure Date: 10/27/2022 8:36 AM MRN: [id number] Endoscopist: Remo Lipps P. Havery Moros , MD, [id number] Age: 76 Referring MD:  Date of Birth: 1946-08-10 Gender: Male Account #: [id number] Procedure:                Colonoscopy Indications:              High risk colon cancer surveillance: Personal                            history of colonic polyps - 4 polyps removed 10/2018 Medicines:                Monitored Anesthesia Care Procedure:                Pre-Anesthesia Assessment:                           - Prior to the procedure, a History and Physical                            was performed, and patient medications and                            allergies were reviewed. The patient's tolerance of                            previous anesthesia was also reviewed. The risks                            and benefits of the procedure and the sedation                            options and risks were discussed with the patient.                            All questions were answered, and informed consent                            was obtained. Prior Anticoagulants: The patient has                            taken Eliquis (apixaban), last dose was 2 days                            prior to procedure. ASA Grade Assessment: II - A                            patient with mild systemic disease. After reviewing                            the risks and benefits, the patient was deemed in                            satisfactory condition to undergo the procedure.  After obtaining informed consent, the colonoscope                            was passed under direct vision. Throughout the                            procedure, the patient's blood pressure, pulse, and                            oxygen saturations were monitored continuously. The                            CF [id number] #[id number] was introduced through the anus                             and advanced to the the cecum, identified by                            appendiceal orifice and ileocecal valve. The                            colonoscopy was performed without difficulty. The                            patient tolerated the procedure well. The quality                            of the bowel preparation was good. The ileocecal                            valve, appendiceal orifice, and rectum were                            photographed. Scope In: 8:44:12 AM Scope Out: 9:06:09 AM Scope Withdrawal Time: 0 hours 19 minutes 12 seconds  Total Procedure Duration: 0 hours 21 minutes 57 seconds  Findings:                 The perianal and digital rectal examinations were                            normal.                           Multiple medium-mouthed diverticula were found in                            the transverse colon and left colon.                           A 3 to 4 mm polyp was found in the transverse                            colon. The polyp was sessile. The polyp was removed  with a cold snare. Resection and retrieval were                            complete.                           A 3 mm polyp was found in the splenic flexure. The                            polyp was sessile. The polyp was removed with a                            cold snare. Resection and retrieval were complete.                           Internal hemorrhoids were found during retroflexion.                           The exam was otherwise without abnormality. Complications:            No immediate complications. Estimated blood loss:                            Minimal. Estimated Blood Loss:     Estimated blood loss was minimal. Impression:               - Diverticulosis in the transverse colon and in the                            left colon.                           - One 3 to 4 mm polyp in the transverse colon,                             removed with a cold snare. Resected and retrieved.                           - One 3 mm polyp at the splenic flexure, removed                            with a cold snare. Resected and retrieved.                           - Internal hemorrhoids.                           - The examination was otherwise normal. Recommendation:           - Patient has a contact number available for                            emergencies. The signs and symptoms of potential  delayed complications were discussed with the                            patient. Return to normal activities tomorrow.                            Written discharge instructions were provided to the                            patient.                           - Resume previous diet.                           - Continue present medications.                           - Resume Eliquis tomorrow                           - Await pathology results. Remo Lipps P. Edison Wollschlager, MD 10/27/2022 9:15:00 AM This report has been signed electronically.

## 2022-10-27 NOTE — Progress Notes (Signed)
VS completed by DT.  Pt's states no medical or surgical changes since previsit or office visit.  

## 2022-10-27 NOTE — Patient Instructions (Signed)
Impression/Recommendations:  Polyp, hemorrhoid, and diverticulosis handouts given to patient.  Resume previous diet. Continue present medications.  Resume Eliquis tomorrow.  Await pathology results.  YOU HAD AN ENDOSCOPIC PROCEDURE TODAY AT [facility]:   Refer to the procedure report that was given to you for any specific questions about what was found during the examination.  If the procedure report does not answer your questions, please call your gastroenterologist to clarify.  If you requested that your care partner not be given the details of your procedure findings, then the procedure report has been included in a sealed envelope for you to review at your convenience later.  YOU SHOULD EXPECT: Some feelings of bloating in the abdomen. Passage of more gas than usual.  Walking can help get rid of the air that was put into your GI tract during the procedure and reduce the bloating. If you had a lower endoscopy (such as a colonoscopy or flexible sigmoidoscopy) you may notice spotting of blood in your stool or on the toilet paper. If you underwent a bowel prep for your procedure, you may not have a normal bowel movement for a few days.  Please Note:  You might notice some irritation and congestion in your nose or some drainage.  This is from the oxygen used during your procedure.  There is no need for concern and it should clear up in a day or so.  SYMPTOMS TO REPORT IMMEDIATELY:  Following lower endoscopy (colonoscopy or flexible sigmoidoscopy):  Excessive amounts of blood in the stool  Significant tenderness or worsening of abdominal pains  Swelling of the abdomen that is new, acute  Fever of 100F or higher For urgent or emergent issues, a gastroenterologist can be reached at any hour by calling [phone]. Do not use MyChart messaging for urgent concerns.    DIET:  We do recommend a small meal at first, but then you may proceed to your regular diet.  Drink plenty  of fluids but you should avoid alcoholic beverages for 24 hours.  ACTIVITY:  You should plan to take it easy for the rest of today and you should NOT DRIVE or use heavy machinery until tomorrow (because of the sedation medicines used during the test).    FOLLOW UP: Our staff will call the number listed on your records the next business day following your procedure.  We will call around 7:15- 8:00 am to check on you and address any questions or concerns that you may have regarding the information given to you following your procedure. If we do not reach you, we will leave a message.     If any biopsies were taken you will be contacted by phone or by letter within the next 1-3 weeks.  Please call us at [phone] if you have not heard about the biopsies in 3 weeks.    SIGNATURES/CONFIDENTIALITY: You and/or your care partner have signed paperwork which will be entered into your electronic medical record.  These signatures attest to the fact that that the information above on your After Visit Summary has been reviewed and is understood.  Full responsibility of the confidentiality of this discharge information lies with you and/or your care-partner.

## 2022-10-27 NOTE — Progress Notes (Signed)
Report given to PACU, vss 

## 2022-10-27 NOTE — Progress Notes (Signed)
[facility] History and Physical   Primary Care Physician:  Darreld Mclean, MD   Reason for Procedure:   History of colon polyps  Plan:    colonoscopy     HPI: Jared Joles. is a 76 y.o. male  here for colonoscopy surveillance - history of polyps removed 10/2018, 4 adenomas.  . Patient denies any bowel symptoms at this time. No family history of colon cancer known. Otherwise feels well without any cardiopulmonary symptoms. He has a history of AF on Eliquis, which has been held for 2 days prior to this exam  I have discussed risks / benefits of anesthesia and endoscopic procedure with [name]. and they wish to proceed with the exams as outlined today.    Past Medical History:  Diagnosis Date   Allergic angioedema    Allergy    Hypertension    Non-insulin dependent type 2 diabetes mellitus (HCC)    PAF (paroxysmal atrial fibrillation) (HCC)    Pneumonia    RAD (reactive airway disease)    Sleep apnea    C-pap intolerant   Tick bite     Past Surgical History:  Procedure Laterality Date   APPENDECTOMY     CARDIOVERSION N/A 02/21/2020   Procedure: CARDIOVERSION;  Surgeon: Skeet Latch, MD;  Location: [facility];  Service: Cardiovascular;  Laterality: N/A;   CHOLECYSTECTOMY     COLONOSCOPY     stress myoview  12/13/2008   EF 61% LV  normal   TONSILLECTOMY      Prior to Admission medications   Medication Sig Start Date End Date Taking? Authorizing Provider  cetirizine (ZYRTEC) 10 MG tablet Take 10 mg by mouth daily.   Yes [provider]  chlorthalidone (HYGROTON) 25 MG tablet TAKE 1 TABLET (25 MG TOTAL) BY MOUTH DAILY. 04/02/22  Yes Copland, Gay Filler, MD  dabigatran (PRADAXA) 150 MG CAPS capsule Take 1 capsule (150 mg total) by mouth 2 (two) times daily. 10/23/22  Yes Meng, Isaac Laud, PA  diltiazem (CARDIZEM CD) 300 MG 24 hr capsule TAKE 1 CAPSULE BY MOUTH EVERY DAY 04/02/22  Yes Copland, Gay Filler, MD  metFORMIN  (GLUCOPHAGE-XR) 500 MG 24 hr tablet TAKE 2 TABLETS BY MOUTH TWICE A DAY 04/11/22  Yes Copland, Gay Filler, MD  metoprolol succinate (TOPROL-XL) 50 MG 24 hr tablet TAKE 1 TABLET BY MOUTH DAILY. TAKE WITH OR IMMEDIATELY FOLLOWING A MEAL. 03/19/22  Yes Copland, Gay Filler, MD  pravastatin (PRAVACHOL) 20 MG tablet TAKE 1 TABLET BY MOUTH EVERY DAY 04/02/22  Yes Copland, Gay Filler, MD  ELIQUIS 5 MG TABS tablet TAKE 1 TABLET BY MOUTH TWICE A DAY 04/02/22   Copland, Gay Filler, MD    Current Outpatient Medications  Medication Sig Dispense Refill   cetirizine (ZYRTEC) 10 MG tablet Take 10 mg by mouth daily.     chlorthalidone (HYGROTON) 25 MG tablet TAKE 1 TABLET (25 MG TOTAL) BY MOUTH DAILY. 90 tablet 3   dabigatran (PRADAXA) 150 MG CAPS capsule Take 1 capsule (150 mg total) by mouth 2 (two) times daily. 180 capsule 3   diltiazem (CARDIZEM CD) 300 MG 24 hr capsule TAKE 1 CAPSULE BY MOUTH EVERY DAY 90 capsule 3   metFORMIN (GLUCOPHAGE-XR) 500 MG 24 hr tablet TAKE 2 TABLETS BY MOUTH TWICE A DAY 360 tablet 3   metoprolol succinate (TOPROL-XL) 50 MG 24 hr tablet TAKE 1 TABLET BY MOUTH DAILY. TAKE WITH OR IMMEDIATELY FOLLOWING A MEAL. 90 tablet 3   pravastatin (PRAVACHOL)  20 MG tablet TAKE 1 TABLET BY MOUTH EVERY DAY 90 tablet 3   ELIQUIS 5 MG TABS tablet TAKE 1 TABLET BY MOUTH TWICE A DAY 180 tablet 3   Current Facility-Administered Medications  Medication Dose Route Frequency Provider Last Rate Last Admin   0.9 %  sodium chloride infusion  500 mL Intravenous Once Everitt Wenner, Carlota Raspberry, MD        Allergies as of 10/27/2022 - Review Complete 10/27/2022  Allergen Reaction Noted   Ibuprofen Swelling 02/04/2012   Lisinopril Swelling 06/14/2009   Oxaprozin Swelling 06/14/2009   Ziac [bisoprolol-hydrochlorothiazide] Swelling 67/59/1638   Bystolic [nebivolol hcl] Itching 02/04/2012   Hctz [hydrochlorothiazide] Swelling 03/05/2012   Norvasc [amlodipine besylate]  03/05/2012    Family History  Problem Relation  Age of Onset   Pancreatic cancer Mother    Hypertension Mother    Cancer Mother    Stroke Father    Hypertension Father    Hypertension Sister    Hypertension Brother    Cancer Maternal Grandfather    Hypertension Daughter    Asthma Son    Colon polyps Neg Hx    Esophageal cancer Neg Hx    Rectal cancer Neg Hx    Stomach cancer Neg Hx    Colon cancer Neg Hx     Social History   Socioeconomic History   Marital status: Married    Spouse name: Not on file   Number of children: Not on file   Years of education: Not on file   Highest education level: Not on file  Occupational History   Not on file  Tobacco Use   Smoking status: Former    Types: Cigarettes, Pipe    Quit date: 10/05/1996    Years since quitting: 26.0   Smokeless tobacco: Former    Types: Chew   Tobacco comments:    Occasionally  Vaping Use   Vaping Use: Never used  Substance and Sexual Activity   Alcohol use: No    Alcohol/week: 0.0 standard drinks of alcohol   Drug use: No   Sexual activity: Yes    Birth control/protection: None  Other Topics Concern   Not on file  Social History Narrative   Not on file   Social Determinants of Health   Financial Resource Strain: Low Risk  (10/31/2020)   Overall Financial Resource Strain (CARDIA)    Difficulty of Paying Living Expenses: Not hard at all  Food Insecurity: Not on file  Transportation Needs: Not on file  Physical Activity: Not on file  Stress: Not on file  Social Connections: Not on file  Intimate Partner Violence: Not on file    Review of Systems: All other review of systems negative except as mentioned in the HPI.  Physical Exam: Vital signs BP (!) 142/67   Pulse 72   Temp 98.4 F (36.9 C) (Temporal)   Ht '6\' 2"'$  (1.88 m)   Wt 249 lb (112.9 kg)   SpO2 97%   BMI 31.97 kg/m   General:   Alert,  Well-developed, pleasant and cooperative in NAD Lungs:  Clear throughout to auscultation.   Heart:  Regular rate and rhythm Abdomen:  Soft,  nontender and nondistended.   Neuro/Psych:  Alert and cooperative. Normal mood and affect. A and O x 3  Jolly Mango, MD [facility]

## 2022-10-27 NOTE — Progress Notes (Signed)
Pt. Got dressed for discharge, said his belly did feel uncomfortable.  Abdomen firm.  Pt. On hands and knees on the stretcher, no air expelled.  Pt. Escorted to restroom, able to pass more air.  Abdomen still firm, pt. Reports discomfort at a level 4.    Pt. And wife placed in consultation room.  Dr. Havery Moros came by, evaluated and conversed with pt. And wife.  Pt. Feels ready to go home, and knows what to do if discomfort should increase.  Color normal, pt. Not distressed, advised not to eat much, and call if he has any questions or concerns.

## 2022-10-28 ENCOUNTER — Ambulatory Visit: Payer: Medicare HMO | Admitting: Internal Medicine

## 2022-10-28 ENCOUNTER — Telehealth: Payer: Self-pay | Admitting: *Deleted

## 2022-10-28 NOTE — Telephone Encounter (Signed)
  Follow up Call-     10/27/2022    8:03 AM  Call back number  Post procedure Call Back phone  # [phone]  Permission to leave phone message Yes     Patient questions:  Do you have a fever, pain , or abdominal swelling? No. Pain Score  0 *  Have you tolerated food without any problems? Yes.    Have you been able to return to your normal activities? Yes.    Do you have any questions about your discharge instructions: Diet   No. Medications  No. Follow up visit  No.  Do you have questions or concerns about your Care? No.  Actions: * If pain score is 4 or above: No action needed, pain <4.

## 2022-10-30 ENCOUNTER — Ambulatory Visit: Payer: Medicare HMO | Admitting: Internal Medicine

## 2022-10-30 DIAGNOSIS — F4320 Adjustment disorder, unspecified: Secondary | ICD-10-CM | POA: Diagnosis not present

## 2022-10-30 DIAGNOSIS — Z630 Problems in relationship with spouse or partner: Secondary | ICD-10-CM | POA: Diagnosis not present

## 2022-10-30 DIAGNOSIS — R69 Illness, unspecified: Secondary | ICD-10-CM | POA: Diagnosis not present

## 2022-11-13 DIAGNOSIS — F4320 Adjustment disorder, unspecified: Secondary | ICD-10-CM | POA: Diagnosis not present

## 2022-11-13 DIAGNOSIS — Z630 Problems in relationship with spouse or partner: Secondary | ICD-10-CM | POA: Diagnosis not present

## 2022-11-13 DIAGNOSIS — R69 Illness, unspecified: Secondary | ICD-10-CM | POA: Diagnosis not present

## 2022-11-19 ENCOUNTER — Telehealth: Payer: Self-pay | Admitting: Internal Medicine

## 2022-11-19 NOTE — Telephone Encounter (Signed)
Pt c/o medication issue:  1. Name of Medication: ELIQUIS 5 MG TABS tablet   2. How are you currently taking this medication (dosage and times per day)? As prescribed   3. Are you having a reaction (difficulty breathing--STAT)? No   4. What is your medication issue? Patient is calling wanting to shop around for the cheapest place he can get his Eliquis. He states the cost has gone up to over $400 so he no longer wants to use that pharmacy, but is needing a copy of the prescription in order to know the cost at other pharmacies. He also mentioned if our office knows of anywhere he can get it for a cheaper price he would appreciate the recommendation. He states you can email or my chart the copy of the prescription. Please advise.

## 2022-11-19 NOTE — Telephone Encounter (Signed)
Attempted to contact patient to discuss.  Left call back number.

## 2022-11-21 NOTE — Telephone Encounter (Signed)
Pt returning nurses call from 12/6. Please advise

## 2022-11-25 NOTE — Telephone Encounter (Signed)
Called patient, he states he would like to do some shopping around with the Eliquis- he would like a printed prescription to take with him. I advised I would route to primary RN to get printed and signed so he could come get it, we would notify him when this was ready for pick up. Patient thankful for call back- he did ask about Coumadin- we did discuss the blood work needed and patient did not really want to do this route, but states he is not going to pay $400 for a 3 month supply. He is not currently out, he paid $150 for a 1 month supply recently and I advised it may reset for the new year, but patient would like to shop around with the prescription either way.   Will route to RN- can you please print and have MD sign?   Thank you!

## 2022-11-26 MED ORDER — DABIGATRAN ETEXILATE MESYLATE 150 MG PO CAPS: 150.0000 mg | ORAL_CAPSULE | Freq: Two times a day (BID) | ORAL | 3 refills | Status: DC

## 2022-11-26 NOTE — Telephone Encounter (Signed)
From 10/23/22 PA note:  Paroxysmal atrial flutter: On Eliquis, however the cost of Eliquis has increased to more than $400 for 4-monthsupply. I have given him 4 weeks of samples, we will transition to Pradaxa afterward.

## 2022-11-26 NOTE — Telephone Encounter (Signed)
Spoke with patient about anticoagulant - per last visit, was to change from eliquis >> pradaxa due to cost.   He did not remember this and looks like Rx was not sent to [facility] at time of visit.  Rx(s) [facility] to [facility]  He will check with pharmacy on co-pay and let us know if not affordable.

## 2022-11-27 DIAGNOSIS — R69 Illness, unspecified: Secondary | ICD-10-CM | POA: Diagnosis not present

## 2022-11-27 DIAGNOSIS — Z630 Problems in relationship with spouse or partner: Secondary | ICD-10-CM | POA: Diagnosis not present

## 2022-11-27 DIAGNOSIS — F4320 Adjustment disorder, unspecified: Secondary | ICD-10-CM | POA: Diagnosis not present

## 2022-12-11 DIAGNOSIS — D225 Melanocytic nevi of trunk: Secondary | ICD-10-CM | POA: Diagnosis not present

## 2022-12-11 DIAGNOSIS — D485 Neoplasm of uncertain behavior of skin: Secondary | ICD-10-CM | POA: Diagnosis not present

## 2022-12-11 DIAGNOSIS — L821 Other seborrheic keratosis: Secondary | ICD-10-CM | POA: Diagnosis not present

## 2022-12-11 DIAGNOSIS — L738 Other specified follicular disorders: Secondary | ICD-10-CM | POA: Diagnosis not present

## 2022-12-11 DIAGNOSIS — C44622 Squamous cell carcinoma of skin of right upper limb, including shoulder: Secondary | ICD-10-CM | POA: Diagnosis not present

## 2022-12-11 DIAGNOSIS — Z85828 Personal history of other malignant neoplasm of skin: Secondary | ICD-10-CM | POA: Diagnosis not present

## 2022-12-11 DIAGNOSIS — D1801 Hemangioma of skin and subcutaneous tissue: Secondary | ICD-10-CM | POA: Diagnosis not present

## 2022-12-11 DIAGNOSIS — L570 Actinic keratosis: Secondary | ICD-10-CM | POA: Diagnosis not present

## 2022-12-18 ENCOUNTER — Encounter: Payer: Self-pay | Admitting: Family Medicine

## 2022-12-18 ENCOUNTER — Ambulatory Visit (INDEPENDENT_AMBULATORY_CARE_PROVIDER_SITE_OTHER): Payer: Medicare HMO | Admitting: Family Medicine

## 2022-12-18 VITALS — BP 148/60 | HR 73 | Temp 98.2°F | Resp 16 | Ht 74.0 in | Wt 247.0 lb

## 2022-12-18 DIAGNOSIS — H6123 Impacted cerumen, bilateral: Secondary | ICD-10-CM | POA: Diagnosis not present

## 2022-12-18 NOTE — Progress Notes (Signed)
Acute Office Visit  Subjective:     Patient ID: [name]., male    DOB: [birth year], 77 y.o.   MRN: [id number]  CC: bilateral ear pain/fullness   HPI   Ear Pain: Patient presents with bilateral ear pain.  Symptoms include bilateral ear pain and plugged sensation in both ears. Symptoms began 4 days ago and are gradually worsening since that time. Patient denies chills, dyspnea, fever, nasal congestion, nonproductive cough, sneezing, and sore throat. Ear history: a few previous ear infections. He tried using OTC irrigation system without any improvement.     ROS All review of systems negative except what is listed in the HPI      Objective:    BP (!) 148/60   Pulse 73   Temp 98.2 F (36.8 C)   Resp 16   Ht '6\' 2"'$  (1.88 m)   Wt 247 lb (112 kg)   SpO2 92%   BMI 31.71 kg/m    Physical Exam Vitals reviewed.  Constitutional:      Appearance: Normal appearance.  HENT:     Head: Normocephalic and atraumatic.     Right Ear: Ear canal and external ear normal. There is impacted cerumen.     Left Ear: Ear canal and external ear normal. There is impacted cerumen.  Eyes:     Conjunctiva/sclera: Conjunctivae normal.  Musculoskeletal:     Cervical back: Normal range of motion and neck supple. No tenderness.  Neurological:     General: No focal deficit present.     Mental Status: He is alert and oriented to person, place, and time. Mental status is at baseline.  Psychiatric:        Mood and Affect: Mood normal.        Behavior: Behavior normal.        Thought Content: Thought content normal.          No results found for any visits on 12/18/22.      Assessment & Plan:   Problem List Items Addressed This Visit   None Visit Diagnoses     Bilateral impacted cerumen    -  Primary Performed by CMA, Shamaine Indication: Cerumen impaction of the ears   Medical necessity statement: On physical examination, cerumen impairs clinically significant portions  of the external auditory canal, and tympanic membrane. Noted obstructive, copious cerumen that cannot be removed without magnification and instrumentations requiring professional removal.   Consent: Discussed benefits and risks of procedure and verbal consent obtained  Procedure: Patient was prepped for the procedure. Otoscope utilized to assess and take note of the ear canal, the tympanic membrane, and the presence, amount, and placement of the cerumen.  Liquid dulcolax, peroxide and water used for irrigation Gentle irrigation with water at body temperature and soft plastic curette utilized to remove impacted cerumen.  Excess water drained by gravity and ear canal(s) dried with clean guaze.  Post procedure examination: Otoscopic examination reveals complete cerumen removal with no damage to the auditory canal, tympanic membrane, or surrounding tissue.  Patient tolerated procedure well.   Post procedure instructions: Patient made aware that they may experience temporary vertigo, temporary changes in hearing, and temporary discomfort. If these symptom last for more than 24 hours to call the clinic or proceed to the ED for further evaluation. Discussed avoiding placing objects into the ear canal for cleaning.        No orders of the defined types were placed in this encounter.   Return if  symptoms worsen or fail to improve.  Terrilyn Saver, NP

## 2022-12-25 DIAGNOSIS — R69 Illness, unspecified: Secondary | ICD-10-CM | POA: Diagnosis not present

## 2022-12-25 DIAGNOSIS — Z630 Problems in relationship with spouse or partner: Secondary | ICD-10-CM | POA: Diagnosis not present

## 2022-12-25 DIAGNOSIS — F4320 Adjustment disorder, unspecified: Secondary | ICD-10-CM | POA: Diagnosis not present

## 2023-01-07 DIAGNOSIS — R69 Illness, unspecified: Secondary | ICD-10-CM | POA: Diagnosis not present

## 2023-01-07 DIAGNOSIS — Z630 Problems in relationship with spouse or partner: Secondary | ICD-10-CM | POA: Diagnosis not present

## 2023-01-07 DIAGNOSIS — F4320 Adjustment disorder, unspecified: Secondary | ICD-10-CM | POA: Diagnosis not present

## 2023-01-22 DIAGNOSIS — R69 Illness, unspecified: Secondary | ICD-10-CM | POA: Diagnosis not present

## 2023-01-22 DIAGNOSIS — F4320 Adjustment disorder, unspecified: Secondary | ICD-10-CM | POA: Diagnosis not present

## 2023-01-22 DIAGNOSIS — Z630 Problems in relationship with spouse or partner: Secondary | ICD-10-CM | POA: Diagnosis not present

## 2023-02-04 ENCOUNTER — Ambulatory Visit (INDEPENDENT_AMBULATORY_CARE_PROVIDER_SITE_OTHER): Payer: Medicare HMO

## 2023-02-04 VITALS — Wt 247.0 lb

## 2023-02-04 DIAGNOSIS — Z0000 Encounter for general adult medical examination without abnormal findings: Secondary | ICD-10-CM

## 2023-02-04 NOTE — Patient Instructions (Addendum)
[name] , Thank you for taking time to come for your Medicare Wellness Visit. I appreciate your ongoing commitment to your health goals. Please review the following plan we discussed and let me know if I can assist you in the future.   These are the goals we discussed:  Goals      A1c goal less than 7 to 7.5%     Blood pressure goal <140/90     Chronic Care Management Pharmacy Care Plan     CARE PLAN ENTRY (see longitudinal plan of care for additional care plan information)  Current Barriers:  Chronic Disease Management support, education, and care coordination needs related to AFib, Diabetes, Hypertension, Low HDL, Allergic Rhinitis   Hypertension BP Readings from Last 3 Encounters:  03/14/20 118/68  02/21/20 114/68  02/15/20 131/69  Pharmacist Clinical Goal(s): Over the next 180 days, patient will work with PharmD and providers to maintain BP goal <140/90 Current regimen:  Chlorthalidone 80m daily  Metoprolol succinate 50 daily Diltiazem 3060mevery day Interventions: Discussed BP goal  Patient self care activities - Over the next 180 days, patient will: Maintain hypertension medication regimen.   Hyperlipidemia Lab Results  Component Value Date/Time   LDLCALC 92 05/23/2019 10:04 AM  Pharmacist Clinical Goal(s): Over the next 180 days, patient will work with PharmD and providers to maintain LDL goal < 100 Current regimen:  Pravastatin 207maily Interventions: Discussed LDL goal Consider updating lipid panel at next visit with Dr. CopLorelei Ponttient self care activities - Over the next 180 days, patient will: Maintain cholesterol medication regimen.  Update lipid panel at next visit with Dr. CopLorelei Pontiabetes Lab Results  Component Value Date/Time   HGBA1C 7.2 (H) 11/14/2019 12:01 PM   HGBA1C 6.9 (H) 05/23/2019 10:04 AM  Pharmacist Clinical Goal(s): Over the next 180 days, patient will work with PharmD and providers to achieve A1c goal <7% Current regimen:   Metformin 500m65m twice daily Interventions: Discussed DM Goal Consider updating a1c at next visit w/ Dr. CoplLorelei Pontient self care activities - Over the next 180 days, patient will: Achieve a1c <7% Update a1c at next visit with Dr. CoplLorelei Pontdication management Pharmacist Clinical Goal(s): Over the next 180 days, patient will work with PharmD and providers to achieve optimal medication adherence Current pharmacy: [facility] Interventions Comprehensive medication review performed. Continue current medication management strategy Patient self care activities - Over the next 180 days, patient will: Focus on medication adherence by filling and taking medications appropriately  Take medications as prescribed Report any questions or concerns to PharmD and/or provider(s)  Please see past updates related to this goal by clicking on the "Past Updates" button in the selected goal      Determine medication regimen with Dr. HiltDebara Picketting appointment on 03/14/20     HDL goal greater than 40     LDL goal less than 100     Patient Stated     Stay healthy and active         This is a list of the screening recommended for you and due dates:  Health Maintenance  Topic Date Due   Zoster (Shingles) Vaccine (1 of 2) Never done   COVID-19 Vaccine (6 - 2023-24 season) 08/15/2022   Eye exam for diabetics  10/14/2022   Yearly kidney health urinalysis for diabetes  03/21/2023   Hemoglobin A1C  03/24/2023   Yearly kidney function blood test for diabetes  09/23/2023   Complete foot exam   09/23/2023  Medicare Annual Wellness Visit  02/05/2024   Colon Cancer Screening  10/27/2025   DTaP/Tdap/Td vaccine (2 - Tdap) 03/20/2026   Pneumonia Vaccine  Completed   Flu Shot  Completed   Hepatitis C Screening: USPSTF Recommendation to screen - Ages 18-79 yo.  Completed   HPV Vaccine  Aged Out    Advanced directives: Please bring a copy of your health care power of attorney and living will to the office at  your convenience.  Conditions/risks identified: continue to stay healthy and active   Next appointment: Follow up in one year for your annual wellness visit. 2  Preventive Care 61 Years and Older, Male  Preventive care refers to lifestyle choices and visits with your health care provider that can promote health and wellness. What does preventive care include? A yearly physical exam. This is also called an annual well check. Dental exams once or twice a year. Routine eye exams. Ask your health care provider how often you should have your eyes checked. Personal lifestyle choices, including: Daily care of your teeth and gums. Regular physical activity. Eating a healthy diet. Avoiding tobacco and drug use. Limiting alcohol use. Practicing safe sex. Taking low doses of aspirin every day. Taking vitamin and mineral supplements as recommended by your health care provider. What happens during an annual well check? The services and screenings done by your health care provider during your annual well check will depend on your age, overall health, lifestyle risk factors, and family history of disease. Counseling  Your health care provider may ask you questions about your: Alcohol use. Tobacco use. Drug use. Emotional well-being. Home and relationship well-being. Sexual activity. Eating habits. History of falls. Memory and ability to understand (cognition). Work and work Statistician. Screening  You may have the following tests or measurements: Height, weight, and BMI. Blood pressure. Lipid and cholesterol levels. These may be checked every 5 years, or more frequently if you are over 90 years old. Skin check. Lung cancer screening. You may have this screening every year starting at age 34 if you have a 30-pack-year history of smoking and currently smoke or have quit within the past 15 years. Fecal occult blood test (FOBT) of the stool. You may have this test every year starting at age  1. Flexible sigmoidoscopy or colonoscopy. You may have a sigmoidoscopy every 5 years or a colonoscopy every 10 years starting at age 63. Prostate cancer screening. Recommendations will vary depending on your family history and other risks. Hepatitis C blood test. Hepatitis B blood test. Sexually transmitted disease (STD) testing. Diabetes screening. This is done by checking your blood sugar (glucose) after you have not eaten for a while (fasting). You may have this done every 1-3 years. Abdominal aortic aneurysm (AAA) screening. You may need this if you are a current or former smoker. Osteoporosis. You may be screened starting at age 89 if you are at high risk. Talk with your health care provider about your test results, treatment options, and if necessary, the need for more tests. Vaccines  Your health care provider may recommend certain vaccines, such as: Influenza vaccine. This is recommended every year. Tetanus, diphtheria, and acellular pertussis (Tdap, Td) vaccine. You may need a Td booster every 10 years. Zoster vaccine. You may need this after age 84. Pneumococcal 13-valent conjugate (PCV13) vaccine. One dose is recommended after age 40. Pneumococcal polysaccharide (PPSV23) vaccine. One dose is recommended after age 41. Talk to your health care provider about which screenings and vaccines you  need and how often you need them. This information is not intended to replace advice given to you by your health care provider. Make sure you discuss any questions you have with your health care provider. Document Released: 12/28/2015 Document Revised: 08/20/2016 Document Reviewed: 10/02/2015 Elsevier Interactive Patient Education  2017 [place] Prevention in the Home Falls can cause injuries. They can happen to people of all ages. There are many things you can do to make your home safe and to help prevent falls. What can I do on the outside of my home? Regularly fix the edges of  walkways and driveways and fix any cracks. Remove anything that might make you trip as you walk through a door, such as a raised step or threshold. Trim any bushes or trees on the path to your home. Use bright outdoor lighting. Clear any walking paths of anything that might make someone trip, such as rocks or tools. Regularly check to see if handrails are loose or broken. Make sure that both sides of any steps have handrails. Any raised decks and porches should have guardrails on the edges. Have any leaves, snow, or ice cleared regularly. Use sand or salt on walking paths during winter. Clean up any spills in your garage right away. This includes oil or grease spills. What can I do in the bathroom? Use night lights. Install grab bars by the toilet and in the tub and shower. Do not use towel bars as grab bars. Use non-skid mats or decals in the tub or shower. If you need to sit down in the shower, use a plastic, non-slip stool. Keep the floor dry. Clean up any water that spills on the floor as soon as it happens. Remove soap buildup in the tub or shower regularly. Attach bath mats securely with double-sided non-slip rug tape. Do not have throw rugs and other things on the floor that can make you trip. What can I do in the bedroom? Use night lights. Make sure that you have a light by your bed that is easy to reach. Do not use any sheets or blankets that are too big for your bed. They should not hang down onto the floor. Have a firm chair that has side arms. You can use this for support while you get dressed. Do not have throw rugs and other things on the floor that can make you trip. What can I do in the kitchen? Clean up any spills right away. Avoid walking on wet floors. Keep items that you use a lot in easy-to-reach places. If you need to reach something above you, use a strong step stool that has a grab bar. Keep electrical cords out of the way. Do not use floor polish or wax that  makes floors slippery. If you must use wax, use non-skid floor wax. Do not have throw rugs and other things on the floor that can make you trip. What can I do with my stairs? Do not leave any items on the stairs. Make sure that there are handrails on both sides of the stairs and use them. Fix handrails that are broken or loose. Make sure that handrails are as long as the stairways. Check any carpeting to make sure that it is firmly attached to the stairs. Fix any carpet that is loose or worn. Avoid having throw rugs at the top or bottom of the stairs. If you do have throw rugs, attach them to the floor with carpet tape. Make sure that  you have a light switch at the top of the stairs and the bottom of the stairs. If you do not have them, ask someone to add them for you. What else can I do to help prevent falls? Wear shoes that: Do not have high heels. Have rubber bottoms. Are comfortable and fit you well. Are closed at the toe. Do not wear sandals. If you use a stepladder: Make sure that it is fully opened. Do not climb a closed stepladder. Make sure that both sides of the stepladder are locked into place. Ask someone to hold it for you, if possible. Clearly mark and make sure that you can see: Any grab bars or handrails. First and last steps. Where the edge of each step is. Use tools that help you move around (mobility aids) if they are needed. These include: Canes. Walkers. Scooters. Crutches. Turn on the lights when you go into a dark area. Replace any light bulbs as soon as they burn out. Set up your furniture so you have a clear path. Avoid moving your furniture around. If any of your floors are uneven, fix them. If there are any pets around you, be aware of where they are. Review your medicines with your doctor. Some medicines can make you feel dizzy. This can increase your chance of falling. Ask your doctor what other things that you can do to help prevent falls. This  information is not intended to replace advice given to you by your health care provider. Make sure you discuss any questions you have with your health care provider. Document Released: 09/27/2009 Document Revised: 05/08/2016 Document Reviewed: 01/05/2015 Elsevier Interactive Patient Education  2017 [employer].

## 2023-02-04 NOTE — Progress Notes (Signed)
I connected with  Phyllis Ginger. on 02/04/23 by a audio enabled telemedicine application and verified that I am speaking with the correct person using two identifiers.  Patient Location: Home  Provider Location: Home Office  I discussed the limitations of evaluation and management by telemedicine. The patient expressed understanding and agreed to proceed.   Subjective:   Mamon Montanez. is a 78 y.o. male who presents for an Initial Medicare Annual Wellness Visit.  Review of Systems     Cardiac Risk Factors include: advanced age (>69mn, >>76women);hypertension;male gender;diabetes mellitus;obesity (BMI >30kg/m2)     Objective:    Today's Vitals   02/04/23 1536  Weight: 247 lb (112 kg)   Body mass index is 31.71 kg/m.     02/04/2023    3:41 PM 02/21/2020    6:51 AM 09/22/2019    9:03 AM 08/27/2015    6:03 PM 08/16/2015   12:15 PM 02/04/2012    1:15 AM  Advanced Directives  Does Patient Have a Medical Advance Directive? Yes Yes No No No Patient does not have advance directive;Patient would like information  Type of Advance Directive [place]      Copy of [place] Chart? No - copy requested No - copy requested      Would patient like information on creating a medical advance directive?   No - Patient declined  No - patient declined information Advance directive packet given  Pre-existing out of facility DNR order (yellow form or pink MOST form)      No    Current Medications (verified) Outpatient Encounter Medications as of 02/04/2023  Medication Sig   apixaban (ELIQUIS) 5 MG TABS tablet Take 5 mg by mouth 2 (two) times daily.   cetirizine (ZYRTEC) 10 MG tablet Take 10 mg by mouth daily.   chlorthalidone (HYGROTON) 25 MG tablet TAKE 1 TABLET (25 MG TOTAL) BY MOUTH DAILY.   diltiazem (CARDIZEM CD) 300 MG 24 hr capsule TAKE 1 CAPSULE BY MOUTH EVERY DAY   metFORMIN  (GLUCOPHAGE-XR) 500 MG 24 hr tablet TAKE 2 TABLETS BY MOUTH TWICE A DAY   metoprolol succinate (TOPROL-XL) 50 MG 24 hr tablet TAKE 1 TABLET BY MOUTH DAILY. TAKE WITH OR IMMEDIATELY FOLLOWING A MEAL.   pravastatin (PRAVACHOL) 20 MG tablet TAKE 1 TABLET BY MOUTH EVERY DAY   [DISCONTINUED] dabigatran (PRADAXA) 150 MG CAPS capsule Take 1 capsule (150 mg total) by mouth 2 (two) times daily.   Facility-Administered Encounter Medications as of 02/04/2023  Medication   0.9 %  sodium chloride infusion    Allergies (verified) Ibuprofen, Lisinopril, Oxaprozin, Ziac [bisoprolol-hydrochlorothiazide], Bystolic [nebivolol hcl], Hctz [hydrochlorothiazide], and Norvasc [amlodipine besylate]   History: Past Medical History:  Diagnosis Date   Allergic angioedema    Allergy    Hypertension    Non-insulin dependent type 2 diabetes mellitus (HCC)    PAF (paroxysmal atrial fibrillation) (HCC)    Pneumonia    RAD (reactive airway disease)    Sleep apnea    C-pap intolerant   Tick bite    Past Surgical History:  Procedure Laterality Date   APPENDECTOMY     CARDIOVERSION N/A 02/21/2020   Procedure: CARDIOVERSION;  Surgeon: RSkeet Latch MD;  [facility]  Service: Cardiovascular;  Laterality: N/A;   CHOLECYSTECTOMY     COLONOSCOPY     stress myoview  12/13/2008   EF 61% LV  normal   TONSILLECTOMY  Family History  Problem Relation Age of Onset   Pancreatic cancer Mother    Hypertension Mother    Cancer Mother    Stroke Father    Hypertension Father    Hypertension Sister    Hypertension Brother    Cancer Maternal Grandfather    Hypertension Daughter    Asthma Son    Colon polyps Neg Hx    Esophageal cancer Neg Hx    Rectal cancer Neg Hx    Stomach cancer Neg Hx    Colon cancer Neg Hx    Social History   Socioeconomic History   Marital status: Married    Spouse name: Not on file   Number of children: Not on file   Years of education: Not on file   Highest education  level: Not on file  Occupational History   Not on file  Tobacco Use   Smoking status: Former    Types: Cigarettes, Pipe    Quit date: 10/05/1996    Years since quitting: 26.3   Smokeless tobacco: Former    Types: Chew   Tobacco comments:    Occasionally  Vaping Use   Vaping Use: Never used  Substance and Sexual Activity   Alcohol use: No    Alcohol/week: 0.0 standard drinks of alcohol   Drug use: No   Sexual activity: Yes    Birth control/protection: None  Other Topics Concern   Not on file  Social History Narrative   Not on file   Social Determinants of Health   Financial Resource Strain: Low Risk  (02/04/2023)   Overall Financial Resource Strain (CARDIA)    Difficulty of Paying Living Expenses: Not hard at all  Food Insecurity: No Food Insecurity (02/04/2023)   Hunger Vital Sign    Worried About Running Out of Food in the Last Year: Never true    Ran Out of Food in the Last Year: Never true  Transportation Needs: No Transportation Needs (02/04/2023)   PRAPARE - Hydrologist (Medical): No    Lack of Transportation (Non-Medical): No  Physical Activity: Sufficiently Active (02/04/2023)   Exercise Vital Sign    Days of Exercise per Week: 5 days    Minutes of Exercise per Session: 60 min  Stress: No Stress Concern Present (02/04/2023)   [place]    Feeling of Stress : Not at all  Social Connections: [place] (02/04/2023)   Social Connection and Isolation Panel [NHANES]    Frequency of Communication with Friends and Family: More than three times a week    Frequency of Social Gatherings with Friends and Family: More than three times a week    Attends Religious Services: More than 4 times per year    Active Member of [employer] or Organizations: Yes    Attends Music therapist: More than 4 times per year    Marital Status: Married    Tobacco  Counseling Counseling given: Not Answered Tobacco comments: Occasionally   Clinical Intake:  Pre-visit preparation completed: Yes  Pain : No/denies pain     BMI - recorded: 31.71 Nutritional Status: BMI > 30  Obese Nutritional Risks: None Diabetes: Yes CBG done?: No Did pt. bring in CBG monitor from home?: No  How often do you need to have someone help you when you read instructions, pamphlets, or other written materials from your doctor or pharmacy?: 1 - Never  Diabetic?Nutrition Risk Assessment:  Has the  patient had any N/V/D within the last 2 months?  No  Does the patient have any non-healing wounds?  No  Has the patient had any unintentional weight loss or weight gain?  No   Diabetes:  Is the patient diabetic?  Yes  If diabetic, was a CBG obtained today?  No  Did the patient bring in their glucometer from home?  No  How often do you monitor your CBG's? As needed .   Financial Strains and Diabetes Management:  Are you having any financial strains with the device, your supplies or your medication? No .  Does the patient want to be seen by Chronic Care Management for management of their diabetes?  No  Would the patient like to be referred to a Nutritionist or for Diabetic Management?  No   Diabetic Exams:  Diabetic Eye Exam: Overdue for diabetic eye exam. Pt has been advised about the importance in completing this exam. Patient advised to call and schedule an eye exam. Diabetic Foot Exam: Completed 09/22/22   Interpreter Needed?: No  Information entered by :: Charlott Rakes, LPN   Activities of Daily Living    02/04/2023    3:42 PM  In your present state of health, do you have any difficulty performing the following activities:  Hearing? 0  Vision? 0  Difficulty concentrating or making decisions? 0  Walking or climbing stairs? 0  Dressing or bathing? 0  Doing errands, shopping? 0  Preparing Food and eating ? N  Using the Toilet? N  In the past six  months, have you accidently leaked urine? N  Do you have problems with loss of bowel control? N  Managing your Medications? N  Managing your Finances? N  Housekeeping or managing your Housekeeping? N    Patient Care Team: Copland, Gay Filler, MD as PCP - General (Family Medicine) Debara Pickett Nadean Corwin, MD as PCP - Cardiology (Cardiology) Day, Melvenia Beam, [facility] (Inactive) as Pharmacist (Pharmacist)  Indicate any recent Medical Services you may have received from other than [facility] providers in the past year (date may be approximate).     Assessment:   This is a routine wellness examination for [place].  Hearing/Vision screen Vision Screening - Comments:: Pt [facility] for annual eye exams   Dietary issues and exercise activities discussed: Current Exercise Habits: Home exercise routine, Type of exercise: Other - see comments;walking;strength training/weights, Time (Minutes): 60, Frequency (Times/Week): 5, Weekly Exercise (Minutes/Week): 300   Goals Addressed             This Visit's Progress    Patient Stated       Stay healthy and active        Depression Screen    02/04/2023    3:40 PM 12/18/2022    9:05 AM 09/25/2021    8:28 AM 03/28/2021   11:29 AM 02/13/2018   12:16 PM 01/07/2018   10:56 AM 06/18/2016    9:52 AM  PHQ 2/9 Scores  PHQ - 2 Score 0 0 0 0 0 0 0    Fall Risk    02/04/2023    3:42 PM 12/18/2022    9:04 AM 09/25/2021    8:29 AM 03/28/2021   11:29 AM 02/13/2018   12:16 PM  Fall Risk   Falls in the past year? 0 0 0 0 No  Number falls in past yr: 0 0 0 0   Injury with Fall? 0 0 0 0   Risk for fall due to :  Impaired vision No Fall Risks     Follow up Falls prevention discussed Falls evaluation completed       FALL RISK PREVENTION PERTAINING TO THE HOME:  Any stairs in or around the home? Yes  If so, are there any without handrails? No  Home free of loose throw rugs in walkways, pet beds, electrical cords, etc? Yes  Adequate lighting in your  home to reduce risk of falls? Yes   ASSISTIVE DEVICES UTILIZED TO PREVENT FALLS:  Life alert? No  Use of a cane, walker or w/c? No  Grab bars in the bathroom? No  Shower chair or bench in shower? No  Elevated toilet seat or a handicapped toilet? No   TIMED UP AND GO:  Was the test performed? No .  Cognitive Function:        02/04/2023    3:43 PM  6CIT Screen  What Year? 0 points  What month? 0 points  What time? 0 points  Count back from 20 0 points  Months in reverse 0 points  Repeat phrase 0 points  Total Score 0 points    Immunizations Immunization History  Administered Date(s) Administered   Fluad Quad(high Dose 65+) 09/22/2022   Influenza, High Dose Seasonal PF 11/02/2017, 10/06/2018   Influenza, Quadrivalent, Recombinant, Inj, Pf 11/01/2019   Influenza-Unspecified 11/01/2014, 11/22/2015, 12/22/2020   Moderna Sars-Covid-2 Vaccination 01/17/2020, 02/16/2020, 08/07/2020   PFIZER Comirnaty(Gray Top)Covid-19 Tri-Sucrose Vaccine 05/29/2021   Pfizer Covid-19 Vaccine Bivalent Booster 70yr & up 09/25/2021   Pneumococcal Conjugate-13 03/20/2015   Pneumococcal Polysaccharide-23 04/02/2017   Td 03/20/2016    TDAP status: Up to date  Flu Vaccine status: Up to date  Pneumococcal vaccine status: Up to date  Covid-19 vaccine status: Completed vaccines  Qualifies for Shingles Vaccine? Yes   Zostavax completed No   Shingrix Completed?: No.    Education has been provided regarding the importance of this vaccine. Patient has been advised to call insurance company to determine out of pocket expense if they have not yet received this vaccine. Advised may also receive vaccine at local pharmacy or Health Dept. Verbalized acceptance and understanding.  Screening Tests Health Maintenance  Topic Date Due   Zoster Vaccines- Shingrix (1 of 2) Never done   COVID-19 Vaccine (6 - 2023-24 season) 08/15/2022   OPHTHALMOLOGY EXAM  10/14/2022   Diabetic kidney evaluation - Urine ACR   03/21/2023   HEMOGLOBIN A1C  03/24/2023   Diabetic kidney evaluation - eGFR measurement  09/23/2023   FOOT EXAM  09/23/2023   Medicare Annual Wellness (AWV)  02/05/2024   COLONOSCOPY (Pts 45-432yrInsurance coverage will need to be confirmed)  10/27/2025   DTaP/Tdap/Td (2 - Tdap) 03/20/2026   Pneumonia Vaccine 6575Years old  Completed   INFLUENZA VACCINE  Completed   Hepatitis C Screening  Completed   HPV VACCINES  Aged Out    Health Maintenance  Health Maintenance Due  Topic Date Due   Zoster Vaccines- Shingrix (1 of 2) Never done   COVID-19 Vaccine (6 - 2023-24 season) 08/15/2022   OPHTHALMOLOGY EXAM  10/14/2022    Colorectal cancer screening: Type of screening: Colonoscopy. Completed 10/27/22. Repeat every 3 years  Additional Screening:  Hepatitis C Screening:  Completed 08/24/15  Vision Screening: Recommended annual ophthalmology exams for early detection of glaucoma and other disorders of the eye. Is the patient up to date with their annual eye exam?  Yes  Who is the provider or what is the name of the office in  which the patient attends annual eye exams? [facility]  If pt is not established with a provider, would they like to be referred to a provider to establish care? No .   Dental Screening: Recommended annual dental exams for proper oral hygiene  Community Resource Referral / Chronic Care Management: CRR required this visit?  No   CCM required this visit?  No      Plan:     I have personally reviewed and noted the following in the patient's chart:   Medical and social history Use of alcohol, tobacco or illicit drugs  Current medications and supplements including opioid prescriptions. Patient is not currently taking opioid prescriptions. Functional ability and status Nutritional status Physical activity Advanced directives List of other physicians Hospitalizations, surgeries, and ER visits in previous 12 months Vitals Screenings to include  cognitive, depression, and falls Referrals and appointments  In addition, I have reviewed and discussed with patient certain preventive protocols, quality metrics, and best practice recommendations. A written personalized care plan for preventive services as well as general preventive health recommendations were provided to patient.     Willette Brace, LPN   [id number]   Nurse Notes: none

## 2023-02-05 DIAGNOSIS — Z630 Problems in relationship with spouse or partner: Secondary | ICD-10-CM | POA: Diagnosis not present

## 2023-02-05 DIAGNOSIS — F4320 Adjustment disorder, unspecified: Secondary | ICD-10-CM | POA: Diagnosis not present

## 2023-02-05 DIAGNOSIS — R69 Illness, unspecified: Secondary | ICD-10-CM | POA: Diagnosis not present

## 2023-02-19 DIAGNOSIS — Z630 Problems in relationship with spouse or partner: Secondary | ICD-10-CM | POA: Diagnosis not present

## 2023-02-19 DIAGNOSIS — R69 Illness, unspecified: Secondary | ICD-10-CM | POA: Diagnosis not present

## 2023-02-19 DIAGNOSIS — F4320 Adjustment disorder, unspecified: Secondary | ICD-10-CM | POA: Diagnosis not present

## 2023-02-20 ENCOUNTER — Other Ambulatory Visit: Payer: Medicare HMO | Admitting: Pharmacist

## 2023-02-20 DIAGNOSIS — E119 Type 2 diabetes mellitus without complications: Secondary | ICD-10-CM

## 2023-02-20 DIAGNOSIS — I4892 Unspecified atrial flutter: Secondary | ICD-10-CM

## 2023-02-20 NOTE — Patient Instructions (Signed)
Mr. Faucette It was a pleasure speaking with you today.   Remember to take metformin ER '500mg'$  - 2 tablets twice a day  Reviewed home blood glucose readings and reviewed goals  Fasting blood glucose goal (before meals) = 80 to 130 Blood glucose goal after a meal = less than 180     As always if you have any questions or concerns especially regarding medications, please feel free to contact me either at the phone number below or with a MyChart message.   Keep up the good work!  Cherre Robins, PharmD Clinical Pharmacist [facility] [phone] (direct line)  [phone] (main office number)

## 2023-02-20 NOTE — Progress Notes (Signed)
02/20/2023 Name: [name]. MRN: [id number] DOB: [birth year]  Chief Complaint  Patient presents with   Medication Management    adherence    [name]. is a 77 y.o. year old male who presented for a telephone visit.   They were referred to the pharmacist by a quality report for assistance in managing complex medication management.   Patient was noted to have low adhernce for diabetes medication in 2023. Based on refill records adherence was 73%  Subjective:  Medication Access/Adherence  Current Pharmacy:  [id number] [place]Phone: [phone]: [phone] [place] [address]Phone: [phone]: [phone]  Patient reports affordability concerns with their medications:  only at the end of the year when he reaches coverage gap and cost of Eliquis increases Patient reports access/transportation concerns to their pharmacy: No  Patient reports adherence concerns with their medications:  No      Diabetes:  Current medications: metformin ER '500mg'$  - take 2 tablets twice a day with food  Current glucose readings: 150's  Patient reports he is not checking regularly  Atrial Flutter:  Current medications: Rate Control: diltiazem Anticoagulation Regimen:Eliquis '5mg'$  twice a day  CHA2DS2-VASc Score = 3  The patient's score is based upon: CHF History: 0 HTN History: 1 Diabetes History: 1 Stroke History: 0 Vascular Disease History: 0 Age Score: 1 Gender Score: 0   { Current medication access support: reached coverage gap around November 2023. Was able to get 1 month of samples from cardiologist and bought 1 month for $150   Objective:  Lab Results  Component Value Date   HGBA1C 7.9 (H) 09/22/2022    Lab Results  Component Value Date   CREATININE 1.21 09/22/2022   BUN 19  09/22/2022   NA 134 (L) 09/22/2022   K 4.4 09/22/2022   CL 94 (L) 09/22/2022   CO2 32 09/22/2022    Lab Results  Component Value Date   CHOL 132 09/22/2022   HDL 31.60 (L) 09/22/2022   LDLCALC 68 09/22/2022   TRIG 163.0 (H) 09/22/2022   CHOLHDL 4 09/22/2022    Medications Reviewed Today     Reviewed by ECherre [name] RPH-CPP (Pharmacist) on 02/20/23 at 170 Med List Status: <None>   Medication Order Taking? Sig Documenting Provider Last Dose Status Informant  0.9 %  sodium chloride infusion [id number]  Armbruster, SCarlota Raspberry MD  Active   apixaban (ELIQUIS) 5 MG TABS tablet [id number] Take 5 mg by mouth 2 (two) times daily. [provider] Taking Active   cetirizine (ZYRTEC) 10 MG tablet [id number] Take 10 mg by mouth daily. [provider]  Active Self  chlorthalidone (HYGROTON) 25 MG tablet [id number] TAKE 1 TABLET (25 MG TOTAL) BY MOUTH DAILY. Copland, JGay Filler MD Taking Active   diltiazem (CARDIZEM CD) 300 MG 24 hr capsule [id number] TAKE 1 CAPSULE BY MOUTH EVERY DAY Copland, JGay Filler MD Taking Active   metFORMIN (GLUCOPHAGE-XR) 500 MG 24 hr tablet [id number] TAKE 2 TABLETS BY MOUTH TWICE A DAY Copland, JGay Filler MD Taking Active   metoprolol succinate (TOPROL-XL) 50 MG 24 hr tablet [id number] TAKE 1 TABLET BY MOUTH DAILY. TAKE WITH OR IMMEDIATELY FOLLOWING A MEAL. Copland, JGay Filler MD Taking Active   pravastatin (PRAVACHOL) 20 MG tablet [id number] TAKE  1 TABLET BY MOUTH EVERY DAY Copland, Gay Filler, MD Taking Active               Assessment/Plan:   Diabetes:A1c not at goal of < 7.5% - Reviewed goal A1c, goal fasting, and goal 2 hour post prandial glucose - Recommend to continue metformin Er '500mg'$  2 tabs twice a day - Discussed adherence strategies for metformin .  - Recommend to check glucose 1 or 2 times per week.  - Reviewed home blood glucose goals  Fasting blood glucose goal (before meals) = 80 to 130 Blood glucose goal  after a meal = less than 180     Atrial Flutter: controlled - Reviewed importance of adherence to anticoagulant for stroke prevention. - Recommend to continue Eliquis '5mg'$  twice a day. Discussed strategies to limit time in coverage gap (he can ask to see if we have samples when in the office).  - Assessed for medication assistance program - income does not qualify.     Follow Up Plan: will check adherence from time to time in 2024 and intervene as needed.   [name], PharmD Clinical Pharmacist [facility]

## 2023-03-05 DIAGNOSIS — R69 Illness, unspecified: Secondary | ICD-10-CM | POA: Diagnosis not present

## 2023-03-05 DIAGNOSIS — Z630 Problems in relationship with spouse or partner: Secondary | ICD-10-CM | POA: Diagnosis not present

## 2023-03-05 DIAGNOSIS — F4320 Adjustment disorder, unspecified: Secondary | ICD-10-CM | POA: Diagnosis not present

## 2023-03-11 ENCOUNTER — Other Ambulatory Visit: Payer: Self-pay | Admitting: Family Medicine

## 2023-03-11 DIAGNOSIS — I4892 Unspecified atrial flutter: Secondary | ICD-10-CM

## 2023-03-19 DIAGNOSIS — Z630 Problems in relationship with spouse or partner: Secondary | ICD-10-CM | POA: Diagnosis not present

## 2023-03-19 DIAGNOSIS — R69 Illness, unspecified: Secondary | ICD-10-CM | POA: Diagnosis not present

## 2023-03-19 DIAGNOSIS — F4320 Adjustment disorder, unspecified: Secondary | ICD-10-CM | POA: Diagnosis not present

## 2023-03-22 NOTE — Progress Notes (Unsigned)
[facility] [address] [place], [place] [address] [phone]  Date:  03/25/2023   Name:  [name].   DOB:  1946-05-31   MRN:  [id number]  PCP:  Pearline Cables, MD    Chief Complaint: No chief complaint on file.   History of Present Illness:  Jared Vold. is a 77 y.o. very pleasant male patient who presents with the following:  Pt seen today for periodic recheck- history of atrial fibrillation, hypertension, diabetes, obstructive sleep apnea  Last seen by myself in October  Need to repeat PSA as it had climbed at last check Lab Results  Component Value Date   PSA 1.53 09/22/2022   PSA 0.73 10/01/2021   PSA 0.73 04/03/2021      Shingrix Eye exam Needs urine micro Colon done in the fall  Coronary calcium last year:  IMPRESSION: 1. Total calcium score of 344 is at percentile 61 for subjects of the same age, gender, and race/ethnicity. 2. Aortic Atherosclerosis (ICD10-I70.0).  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   Low HDL (under 40) 07/12/2013   History of angioedema 11/08/2012   Paroxysmal atrial flutter (HCC) 02/04/2012   Essential hypertension 06/15/2009   BRONCHOSPASM 06/14/2009    Past Medical History:  Diagnosis Date   Allergic angioedema    Allergy    Hypertension    Non-insulin dependent type 2 diabetes mellitus (HCC)    PAF (paroxysmal atrial fibrillation) (HCC)    Pneumonia    RAD (reactive airway disease)    Sleep apnea    C-pap intolerant    Tick bite     Past Surgical History:  Procedure Laterality Date   APPENDECTOMY     CARDIOVERSION N/A 02/21/2020   Procedure: CARDIOVERSION;  Surgeon: Chilton Si, MD;  Location: [facility] ENDOSCOPY;  Service: Cardiovascular;  Laterality: N/A;   CHOLECYSTECTOMY     COLONOSCOPY     stress myoview  12/13/2008   EF 61% LV  normal   TONSILLECTOMY      Social History   Tobacco Use   Smoking status: Former    Types: Cigarettes, Pipe    Quit date: 10/05/1996    Years since quitting: 26.4   Smokeless tobacco: Former    Types: Chew   Tobacco comments:    Occasionally  Vaping Use   Vaping Use: Never used  Substance Use Topics   Alcohol use: No    Alcohol/week: 0.0 standard drinks of alcohol   Drug use: No    Family History  Problem Relation Age of Onset   Pancreatic cancer Mother    Hypertension Mother    Cancer Mother    Stroke Father    Hypertension Father    Hypertension Sister    Hypertension Brother    Cancer Maternal Grandfather    Hypertension Daughter    Asthma Son    Colon polyps Neg Hx    Esophageal cancer Neg Hx    Rectal cancer Neg Hx    Stomach  cancer Neg Hx    Colon cancer Neg Hx     Allergies  Allergen Reactions   Ibuprofen Swelling   Lisinopril Swelling    Swelling of face and lips    Oxaprozin Swelling    Swelling of face and lips   Ziac [Bisoprolol-Hydrochlorothiazide] Swelling    Per patient records from Dr. Herb Grays.   Bystolic [Nebivolol Hcl] Itching   Hctz [Hydrochlorothiazide] Swelling   Norvasc [Amlodipine Besylate]     Unknown     Medication list has been reviewed and updated.  Current Outpatient Medications on File Prior to Visit  Medication Sig Dispense Refill   apixaban (ELIQUIS) 5 MG TABS tablet Take 5 mg by mouth 2 (two) times daily.     cetirizine (ZYRTEC) 10 MG tablet Take 10 mg by mouth daily.     chlorthalidone (HYGROTON) 25 MG tablet TAKE 1 TABLET (25 MG TOTAL) BY MOUTH DAILY. 90 tablet 3   diltiazem (CARDIZEM CD)  300 MG 24 hr capsule TAKE 1 CAPSULE BY MOUTH EVERY DAY 90 capsule 3   metFORMIN (GLUCOPHAGE-XR) 500 MG 24 hr tablet TAKE 2 TABLETS BY MOUTH TWICE A DAY 360 tablet 3   metoprolol succinate (TOPROL-XL) 50 MG 24 hr tablet TAKE 1 TABLET BY MOUTH EVERY DAY WITH OR IMMEDIATELY FOLLOWING A MEAL 90 tablet 3   pravastatin (PRAVACHOL) 20 MG tablet TAKE 1 TABLET BY MOUTH EVERY DAY 90 tablet 3   Current Facility-Administered Medications on File Prior to Visit  Medication Dose Route Frequency Provider Last Rate Last Admin   0.9 %  sodium chloride infusion  500 mL Intravenous Once Armbruster, Willaim Rayas, MD        Review of Systems:  As per HPI- otherwise negative.   Physical Examination: There were no vitals filed for this visit. There were no vitals filed for this visit. There is no height or weight on file to calculate BMI. Ideal Body Weight:    GEN: no acute distress. HEENT: Atraumatic, Normocephalic.  Ears and Nose: No external deformity. CV: RRR, No M/G/R. No JVD. No thrill. No extra heart sounds. PULM: CTA B, no wheezes, crackles, rhonchi. No retractions. No resp. distress. No accessory muscle use. ABD: S, NT, ND, +BS. No rebound. No HSM. EXTR: No c/c/e PSYCH: Normally interactive. Conversant.    Assessment and Plan: ***  Signed Abbe Amsterdam, MD

## 2023-03-22 NOTE — Patient Instructions (Signed)
Good to see you today I will be in touch with your labs asap Please consider the shingles vaccine and a covid booster if not done in the last 6 months or so Assuming all is well please see me in about 6 months

## 2023-03-25 ENCOUNTER — Encounter: Payer: Self-pay | Admitting: Family Medicine

## 2023-03-25 ENCOUNTER — Ambulatory Visit (INDEPENDENT_AMBULATORY_CARE_PROVIDER_SITE_OTHER): Payer: Medicare HMO | Admitting: Family Medicine

## 2023-03-25 VITALS — BP 132/78 | HR 66 | Temp 97.7°F | Resp 18 | Ht 74.0 in | Wt 240.6 lb

## 2023-03-25 DIAGNOSIS — R739 Hyperglycemia, unspecified: Secondary | ICD-10-CM | POA: Diagnosis not present

## 2023-03-25 DIAGNOSIS — I4892 Unspecified atrial flutter: Secondary | ICD-10-CM

## 2023-03-25 DIAGNOSIS — E119 Type 2 diabetes mellitus without complications: Secondary | ICD-10-CM

## 2023-03-25 DIAGNOSIS — R972 Elevated prostate specific antigen [PSA]: Secondary | ICD-10-CM

## 2023-03-25 DIAGNOSIS — E785 Hyperlipidemia, unspecified: Secondary | ICD-10-CM | POA: Diagnosis not present

## 2023-03-25 DIAGNOSIS — Z7901 Long term (current) use of anticoagulants: Secondary | ICD-10-CM | POA: Diagnosis not present

## 2023-03-25 DIAGNOSIS — I10 Essential (primary) hypertension: Secondary | ICD-10-CM | POA: Diagnosis not present

## 2023-03-25 LAB — CBC
HCT: 51.2 % (ref 39.0–52.0)
Hemoglobin: 17.9 g/dL — ABNORMAL HIGH (ref 13.0–17.0)
MCHC: 35 g/dL (ref 30.0–36.0)
MCV: 88.7 fl (ref 78.0–100.0)
Platelets: 313 10*3/uL (ref 150.0–400.0)
RBC: 5.77 Mil/uL (ref 4.22–5.81)
RDW: 14.3 % (ref 11.5–15.5)
WBC: 6.9 10*3/uL (ref 4.0–10.5)

## 2023-03-25 LAB — COMPREHENSIVE METABOLIC PANEL
ALT: 15 U/L (ref 0–53)
AST: 17 U/L (ref 0–37)
Albumin: 4.2 g/dL (ref 3.5–5.2)
Alkaline Phosphatase: 62 U/L (ref 39–117)
BUN: 20 mg/dL (ref 6–23)
CO2: 32 mEq/L (ref 19–32)
Calcium: 9.8 mg/dL (ref 8.4–10.5)
Chloride: 96 mEq/L (ref 96–112)
Creatinine, Ser: 1.31 mg/dL (ref 0.40–1.50)
GFR: 53.67 mL/min — ABNORMAL LOW (ref >60.00)
Glucose, Bld: 160 mg/dL — ABNORMAL HIGH (ref 70–99)
Potassium: 4.2 mEq/L (ref 3.5–5.1)
Sodium: 138 mEq/L (ref 135–145)
Total Bilirubin: 0.5 mg/dL (ref 0.2–1.2)
Total Protein: 7.2 g/dL (ref 6.0–8.3)

## 2023-03-25 LAB — MICROALBUMIN / CREATININE URINE RATIO
Creatinine,U: 89.1 mg/dL
Microalb Creat Ratio: 7.1 mg/g (ref 0.0–30.0)
Microalb, Ur: 6.3 mg/dL — ABNORMAL HIGH (ref 0.0–1.9)

## 2023-03-25 LAB — PSA: PSA: 1.39 ng/mL (ref 0.10–4.00)

## 2023-03-25 LAB — HEMOGLOBIN A1C: Hgb A1c MFr Bld: 7.2 % — ABNORMAL HIGH (ref 4.6–6.5)

## 2023-03-25 MED ORDER — CHLORTHALIDONE 25 MG PO TABS: 25.0000 mg | ORAL_TABLET | Freq: Every day | ORAL | 3 refills | Status: DC

## 2023-03-25 MED ORDER — METFORMIN HCL ER 500 MG PO TB24: 1000.0000 mg | ORAL_TABLET | Freq: Two times a day (BID) | ORAL | 3 refills | Status: DC

## 2023-03-25 MED ORDER — DILTIAZEM HCL ER COATED BEADS 300 MG PO CP24: ORAL_CAPSULE | ORAL | 3 refills | Status: DC

## 2023-03-25 MED ORDER — PRAVASTATIN SODIUM 20 MG PO TABS: 20.0000 mg | ORAL_TABLET | Freq: Every day | ORAL | 3 refills | Status: DC

## 2023-03-30 NOTE — Telephone Encounter (Signed)
Labs have been faxed.

## 2023-03-30 NOTE — Telephone Encounter (Signed)
Patient would like his 04/10 labs to be faxed to [facility] fax number: [phone].

## 2023-04-02 DIAGNOSIS — Z630 Problems in relationship with spouse or partner: Secondary | ICD-10-CM | POA: Diagnosis not present

## 2023-04-02 DIAGNOSIS — R69 Illness, unspecified: Secondary | ICD-10-CM | POA: Diagnosis not present

## 2023-04-02 DIAGNOSIS — F4320 Adjustment disorder, unspecified: Secondary | ICD-10-CM | POA: Diagnosis not present

## 2023-04-13 ENCOUNTER — Telehealth: Payer: Self-pay | Admitting: Pharmacist

## 2023-04-13 NOTE — Telephone Encounter (Signed)
Patient had low adherence for statin in 2023.  Reviwed refill history in Epic for pravastatin not showing. Called [facility] to verify pravastatin was filled 03/02/2023 for 90 days and picked up.

## 2023-04-16 DIAGNOSIS — F4320 Adjustment disorder, unspecified: Secondary | ICD-10-CM | POA: Diagnosis not present

## 2023-04-16 DIAGNOSIS — Z630 Problems in relationship with spouse or partner: Secondary | ICD-10-CM | POA: Diagnosis not present

## 2023-04-25 ENCOUNTER — Other Ambulatory Visit: Payer: Self-pay | Admitting: Family Medicine

## 2023-04-25 DIAGNOSIS — I4892 Unspecified atrial flutter: Secondary | ICD-10-CM

## 2023-04-27 ENCOUNTER — Other Ambulatory Visit: Payer: Self-pay | Admitting: Internal Medicine

## 2023-04-27 DIAGNOSIS — I4892 Unspecified atrial flutter: Secondary | ICD-10-CM

## 2023-04-27 NOTE — Telephone Encounter (Signed)
Eliquis 5mg  refill request received. Patient is 77 years old, weight-109.1kg, Crea-1.31 on 03/25/23, Diagnosis-Aflutter and last seen by Azalee Course on 10/23/22. Dose is appropriate based on dosing criteria.   Eliquis refill was sent today by Dr. Patsy Lager. Will not resend as this is a duplicate request.

## 2023-04-27 NOTE — Telephone Encounter (Signed)
*  STAT* If patient is at the pharmacy, call can be transferred to refill team.   1. Which medications need to be refilled? (please list name of each medication and dose if known) ELIQUIS 5 MG TABS tablet   2. Which pharmacy/location (including street and city if local pharmacy) is medication to be sent to? [id number] [place], [place] - [address]   3. Do they need a 30 day or 90 day supply? 90

## 2023-04-30 DIAGNOSIS — F4320 Adjustment disorder, unspecified: Secondary | ICD-10-CM | POA: Diagnosis not present

## 2023-04-30 DIAGNOSIS — Z630 Problems in relationship with spouse or partner: Secondary | ICD-10-CM | POA: Diagnosis not present

## 2023-05-14 DIAGNOSIS — Z630 Problems in relationship with spouse or partner: Secondary | ICD-10-CM | POA: Diagnosis not present

## 2023-05-14 DIAGNOSIS — F4320 Adjustment disorder, unspecified: Secondary | ICD-10-CM | POA: Diagnosis not present

## 2023-05-28 DIAGNOSIS — F4320 Adjustment disorder, unspecified: Secondary | ICD-10-CM | POA: Diagnosis not present

## 2023-05-28 DIAGNOSIS — Z630 Problems in relationship with spouse or partner: Secondary | ICD-10-CM | POA: Diagnosis not present

## 2023-06-11 DIAGNOSIS — Z630 Problems in relationship with spouse or partner: Secondary | ICD-10-CM | POA: Diagnosis not present

## 2023-06-11 DIAGNOSIS — F4320 Adjustment disorder, unspecified: Secondary | ICD-10-CM | POA: Diagnosis not present

## 2023-06-12 ENCOUNTER — Telehealth: Payer: Self-pay | Admitting: Pharmacist

## 2023-06-12 NOTE — Telephone Encounter (Signed)
-----   Message from Henrene Pastor, RPH-CPP sent at 02/20/2023  1:48 PM EST ----- Regarding: pravastatin Check adherence for pravastatin  - LR 11/2022 - due around 03/02/2023

## 2023-06-12 NOTE — Telephone Encounter (Signed)
Pharmacy Quality Measure Review  This patient is appearing on a report for failing the adherence measure for cholesterol (statin) and diabetes medications in 2023. Pharmacy has been following in 2024 to make sure no issues with prescription refill or patient obtaining medications.   Filled pravastatin 03/02/2023 - 90 DS and again 05/29/2023 for 90 days Filled metformin 01/05/2023 - 90 DS; 04/04/2023 - 90 DS and 06/12/2023 - 90 DS.   Marland Kitchen  Patient has good adherence per refill records for 2024.

## 2023-06-26 DIAGNOSIS — F4320 Adjustment disorder, unspecified: Secondary | ICD-10-CM | POA: Diagnosis not present

## 2023-06-26 DIAGNOSIS — Z630 Problems in relationship with spouse or partner: Secondary | ICD-10-CM | POA: Diagnosis not present

## 2023-07-23 DIAGNOSIS — F4320 Adjustment disorder, unspecified: Secondary | ICD-10-CM | POA: Diagnosis not present

## 2023-07-23 DIAGNOSIS — Z630 Problems in relationship with spouse or partner: Secondary | ICD-10-CM | POA: Diagnosis not present

## 2023-08-20 DIAGNOSIS — F4320 Adjustment disorder, unspecified: Secondary | ICD-10-CM | POA: Diagnosis not present

## 2023-09-03 DIAGNOSIS — F4320 Adjustment disorder, unspecified: Secondary | ICD-10-CM | POA: Diagnosis not present

## 2023-09-17 DIAGNOSIS — F4320 Adjustment disorder, unspecified: Secondary | ICD-10-CM | POA: Diagnosis not present

## 2023-09-17 DIAGNOSIS — E291 Testicular hypofunction: Secondary | ICD-10-CM | POA: Diagnosis not present

## 2023-09-24 NOTE — Patient Instructions (Addendum)
It was good to see you again today, I will be in touch with your labs.  Assuming all is well please see me in about 6 months Recommend shingles vaccine series, COVID booster this fall ' Flu shot given today

## 2023-09-24 NOTE — Progress Notes (Signed)
[facility] [address] [place], [place] [address] [phone]  Date:  09/28/2023   Name:  [name].   DOB:  [birth year]   MRN:  [id number]  PCP:  Pearline Cables, MD    Chief Complaint: Follow-up (No concerns )   History of Present Illness:  [name]. is a 77 y.o. very pleasant male patient who presents with the following:  Patient seen today for periodic follow-up-  history of atrial fibrillation, hypertension, diabetes, obstructive sleep apnea  Most recent visit with myself was in April  We did a coronary calcium last year, 61th percentile  Eliquis- no SE  Chlortahlizdone Dilt 300 Metformin 1000 BID Toprol xl 50 Pravastatin    A1c in April was 7.2% We did note polycythemia with hemoglobin just shy of 18-I asked patient about smoking, steroids/testosterone and recommended having him see hematology.  He did not respond- however today he states he is getting T from a doctor in [place]- [facility]" - interestingly this does not show up in PDMP He is donating blood to manage his polycythemia  He has actually started seeing a counselor over the last several months at the most recent visit I see is from August  Eye exam- he thinks this is UTD- [facility] in [place] center  Flu shot- give today  COVID booster- recommended Foot exam due Recommend Shingrix  BP Readings from Last 3 Encounters:  09/28/23 (!) 140/78  03/25/23 132/78  12/18/22 (!) 148/60     Patient Active Problem List   Diagnosis Date Noted   Typical atrial flutter (HCC)    Anticoagulated 02/15/2020   Sleep apnea 01/24/2020   Controlled type 2 diabetes mellitus without complication, without long-term current use of insulin (HCC) 04/02/2017   Left hip pain 02/25/2016   CMV infection, acute (HCC) 09/11/2015   Low HDL (under 40) 07/12/2013   History of angioedema 11/08/2012   Paroxysmal  atrial flutter (HCC) 02/04/2012   Essential hypertension 06/15/2009   BRONCHOSPASM 06/14/2009    Past Medical History:  Diagnosis Date   Allergic angioedema    Allergy    Hypertension    Non-insulin dependent type 2 diabetes mellitus (HCC)    PAF (paroxysmal atrial fibrillation) (HCC)    Pneumonia    RAD (reactive airway disease)    Sleep apnea    C-pap intolerant   Tick bite     Past Surgical History:  Procedure Laterality Date   APPENDECTOMY     CARDIOVERSION N/A 02/21/2020   Procedure: CARDIOVERSION;  Surgeon: Chilton Si, MD;  Location: [facility] END[facility]COPY;  Service: Cardiovascular;  Laterality: N/A;   CHOLECYSTECTOMY     COLONOSCOPY     stress myoview  12/13/2008   EF 61% LV  normal   TONSILLECTOMY      Social History   Tobacco Use   Smoking status: Former    Current packs/day: 0.00    Types: Cigarettes, Pipe    Quit date: 10/05/1996    Years since quitting: 26.9   Smokeless tobacco: Former    Types: Chew   Tobacco comments:    Occasionally  Vaping Use   Vaping status: Never Used  Substance Use Topics   Alcohol use: No    Alcohol/week: 0.0 standard drinks of alcohol   Drug use: No    Family History  Problem Relation Age of Onset   Pancreatic cancer Mother  Hypertension Mother    Cancer Mother    Stroke Father    Hypertension Father    Hypertension Sister    Hypertension Brother    Cancer Maternal Grandfather    Hypertension Daughter    Asthma Son    Colon polyps Neg Hx    Esophageal cancer Neg Hx    Rectal cancer Neg Hx    Stomach cancer Neg Hx    Colon cancer Neg Hx     Allergies  Allergen Reactions   Ibuprofen Swelling   Lisinopril Swelling    Swelling of face and lips    Oxaprozin Swelling    Swelling of face and lips   Ziac [Bisoprolol-Hydrochlorothiazide] Swelling    Per patient records from Dr. Herb Grays.   Bystolic [Nebivolol Hcl] Itching   Hctz [Hydrochlorothiazide] Swelling   Norvasc [Amlodipine Besylate]     Unknown      Medication list has been reviewed and updated.  Current Outpatient Medications on File Prior to Visit  Medication Sig Dispense Refill   cetirizine (ZYRTEC) 10 MG tablet Take 10 mg by mouth daily.     chlorthalidone (HYGROTON) 25 MG tablet Take 1 tablet (25 mg total) by mouth daily. 90 tablet 3   diltiazem (CARDIZEM CD) 300 MG 24 hr capsule TAKE 1 CAPSULE BY MOUTH EVERY DAY 90 capsule 3   ELIQUIS 5 MG TABS tablet TAKE 1 TABLET BY MOUTH TWICE A DAY 180 tablet 3   metFORMIN (GLUCOPHAGE-XR) 500 MG 24 hr tablet Take 2 tablets (1,000 mg total) by mouth 2 (two) times daily. 360 tablet 3   metoprolol succinate (TOPROL-XL) 50 MG 24 hr tablet TAKE 1 TABLET BY MOUTH EVERY DAY WITH OR IMMEDIATELY FOLLOWING A MEAL 90 tablet 3   pravastatin (PRAVACHOL) 20 MG tablet Take 1 tablet (20 mg total) by mouth daily. 90 tablet 3   No current facility-administered medications on file prior to visit.    Review of Systems:  As per HPI- otherwise negative.   Physical Examination: Vitals:   09/28/23 0813  BP: (!) 140/78  Pulse: 82  SpO2: 98%   Vitals:   09/28/23 0813  Weight: 242 lb 12.8 oz (110.1 kg)  Height: 6\' 2"  (1.88 m)   Body mass index is 31.17 kg/m. Ideal Body Weight: Weight in (lb) to have BMI = 25: 194.3  GEN: no acute distress. HEENT: Atraumatic, Normocephalic.  Ears and Nose: No external deformity. CV: RRR, No M/G/R. No JVD. No thrill. No extra heart sounds. PULM: CTA B, no wheezes, crackles, rhonchi. No retractions. No resp. distress. No accessory muscle use. ABD: S, NT, ND, +BS. No rebound. No HSM. EXTR: No c/c/e PSYCH: Normally interactive. Conversant.  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 Check CBC Follow-up on DM control today Gave flu shot  Will plan further follow- up pending labs. May need to adjust BP meds- add ARB- if BP elevation remains  Signed Abbe Amsterdam, MD  Received labs as below, message to patient  Results for orders placed or performed in visit on  09/28/23  CBC  Result Value Ref Range   WBC 7.6 4.0 - 10.5 K/uL   RBC 5.66 4.22 - 5.81 Mil/uL   Platelets 310.0 150.0 - 400.0 K/uL   Hemoglobin 17.0 13.0 - 17.0 g/dL   HCT 16.1 09.6 - 04.5 %   MCV 89.1 78.0 - 100.0 fl   MCHC 33.7 30.0 - 36.0 g/dL   RDW 40.9 81.1 - 91.4 %  Hemoglobin A1c  Result Value Ref Range   Hgb A1c MFr Bld 7.4 (H) 4.6 - 6.5 %  PSA  Result Value Ref Range   PSA 1.57 0.10 - 4.00 ng/mL  Lipid panel  Result Value Ref Range   Cholesterol 142 0 - 200 mg/dL   Triglycerides 782.9 0.0 - 149.0 mg/dL   HDL 56.21 (L) >30.86 mg/dL   VLDL 57.8 0.0 - 46.9 mg/dL   LDL Cholesterol 82 0 - 99 mg/dL   Total CHOL/HDL Ratio 4    NonHDL 105.58   Basic metabolic panel  Result Value Ref Range   Sodium 134 (L) 135 - 145 mEq/L   Potassium 4.9 3.5 - 5.1 mEq/L   Chloride 95 (L) 96 - 112 mEq/L   CO2 31 19 - 32 mEq/L   Glucose, Bld 184 (H) 70 - 99 mg/dL   BUN 32 (H) 6 - 23 mg/dL   Creatinine, Ser 6.29 0.40 - 1.50 mg/dL   GFR 52.84 (L) >13.24 mL/min   Calcium 9.8 8.4 - 10.5 mg/dL

## 2023-09-28 ENCOUNTER — Encounter: Payer: Self-pay | Admitting: Family Medicine

## 2023-09-28 ENCOUNTER — Ambulatory Visit (INDEPENDENT_AMBULATORY_CARE_PROVIDER_SITE_OTHER): Payer: Medicare HMO | Admitting: Family Medicine

## 2023-09-28 VITALS — BP 140/78 | HR 82 | Ht 74.0 in | Wt 242.8 lb

## 2023-09-28 DIAGNOSIS — Z7901 Long term (current) use of anticoagulants: Secondary | ICD-10-CM | POA: Diagnosis not present

## 2023-09-28 DIAGNOSIS — R972 Elevated prostate specific antigen [PSA]: Secondary | ICD-10-CM | POA: Diagnosis not present

## 2023-09-28 DIAGNOSIS — I10 Essential (primary) hypertension: Secondary | ICD-10-CM | POA: Diagnosis not present

## 2023-09-28 DIAGNOSIS — E119 Type 2 diabetes mellitus without complications: Secondary | ICD-10-CM | POA: Diagnosis not present

## 2023-09-28 DIAGNOSIS — E785 Hyperlipidemia, unspecified: Secondary | ICD-10-CM

## 2023-09-28 DIAGNOSIS — R799 Abnormal finding of blood chemistry, unspecified: Secondary | ICD-10-CM | POA: Diagnosis not present

## 2023-09-28 DIAGNOSIS — Z23 Encounter for immunization: Secondary | ICD-10-CM | POA: Diagnosis not present

## 2023-09-28 DIAGNOSIS — I4892 Unspecified atrial flutter: Secondary | ICD-10-CM

## 2023-09-28 LAB — BASIC METABOLIC PANEL
BUN: 32 mg/dL — ABNORMAL HIGH (ref 6–23)
CO2: 31 meq/L (ref 19–32)
Calcium: 9.8 mg/dL (ref 8.4–10.5)
Chloride: 95 meq/L — ABNORMAL LOW (ref 96–112)
Creatinine, Ser: 1.39 mg/dL (ref 0.40–1.50)
GFR: 49.81 mL/min — ABNORMAL LOW (ref >60.00)
Glucose, Bld: 184 mg/dL — ABNORMAL HIGH (ref 70–99)
Potassium: 4.9 meq/L (ref 3.5–5.1)
Sodium: 134 meq/L — ABNORMAL LOW (ref 135–145)

## 2023-09-28 LAB — LIPID PANEL
Cholesterol: 142 mg/dL (ref 0–200)
HDL: 36.9 mg/dL — ABNORMAL LOW (ref >39.00)
LDL Cholesterol: 82 mg/dL (ref 0–99)
NonHDL: 105.58
Total CHOL/HDL Ratio: 4
Triglycerides: 116 mg/dL (ref 0.0–149.0)
VLDL: 23.2 mg/dL (ref 0.0–40.0)

## 2023-09-28 LAB — HEMOGLOBIN A1C: Hgb A1c MFr Bld: 7.4 % — ABNORMAL HIGH (ref 4.6–6.5)

## 2023-09-28 LAB — CBC
HCT: 50.5 % (ref 39.0–52.0)
Hemoglobin: 17 g/dL (ref 13.0–17.0)
MCHC: 33.7 g/dL (ref 30.0–36.0)
MCV: 89.1 fL (ref 78.0–100.0)
Platelets: 310 10*3/uL (ref 150.0–400.0)
RBC: 5.66 Mil/uL (ref 4.22–5.81)
RDW: 14 % (ref 11.5–15.5)
WBC: 7.6 10*3/uL (ref 4.0–10.5)

## 2023-09-28 LAB — PSA: PSA: 1.57 ng/mL (ref 0.10–4.00)

## 2023-10-01 DIAGNOSIS — F4320 Adjustment disorder, unspecified: Secondary | ICD-10-CM | POA: Diagnosis not present

## 2023-10-15 DIAGNOSIS — F4320 Adjustment disorder, unspecified: Secondary | ICD-10-CM | POA: Diagnosis not present

## 2023-10-16 ENCOUNTER — Encounter: Payer: Self-pay | Admitting: Pharmacist

## 2023-10-16 NOTE — Progress Notes (Signed)
Pharmacy Quality Measure Review  This patient is appearing on a report for failing the adherence measure for cholesterol (statin) and diabetes medications in 2023. Pharmacy has been following in 2024 to make sure no issues with prescription refill or patient obtaining medications.   Filled pravastatin 03/02/2023; 05/29/2023 and 08/28/2023  for 90 days Filled metformin 01/05/2023; 04/04/2023; 06/12/2023 and 09/04/2023 for - 90 day supply  .  Patient has good adherence per refill records for 2024.   Also noted we do not have a diabetic eye exam on file for 2024. Patient was not sure if his last exam was in 2024 or 2023.  Called [facility] in [place] to check last office visit. Per their office his last visit was 10/ 31/2022. Discussed importance of yearly diabetic eye exams and encouraged patient to call to make an appointment [phone]

## 2023-11-09 DIAGNOSIS — F4320 Adjustment disorder, unspecified: Secondary | ICD-10-CM | POA: Diagnosis not present

## 2023-11-10 ENCOUNTER — Encounter (INDEPENDENT_AMBULATORY_CARE_PROVIDER_SITE_OTHER): Payer: Medicare HMO | Admitting: Family Medicine

## 2023-11-10 DIAGNOSIS — F418 Other specified anxiety disorders: Secondary | ICD-10-CM

## 2023-11-11 MED ORDER — ALPRAZOLAM 0.25 MG PO TABS
0.2500 mg | ORAL_TABLET | Freq: Every day | ORAL | 0 refills | Status: DC | PRN
Start: 2023-11-11 — End: 2024-10-04

## 2023-11-11 NOTE — Addendum Note (Signed)
Addended by: Abbe Amsterdam C on: 11/11/2023 09:31 AM   Modules accepted: Orders

## 2023-11-11 NOTE — Telephone Encounter (Signed)

## 2023-11-26 DIAGNOSIS — E119 Type 2 diabetes mellitus without complications: Secondary | ICD-10-CM | POA: Diagnosis not present

## 2023-12-01 DIAGNOSIS — L821 Other seborrheic keratosis: Secondary | ICD-10-CM | POA: Diagnosis not present

## 2023-12-01 DIAGNOSIS — Z85828 Personal history of other malignant neoplasm of skin: Secondary | ICD-10-CM | POA: Diagnosis not present

## 2023-12-01 DIAGNOSIS — D3617 Benign neoplasm of peripheral nerves and autonomic nervous system of trunk, unspecified: Secondary | ICD-10-CM | POA: Diagnosis not present

## 2023-12-01 DIAGNOSIS — L570 Actinic keratosis: Secondary | ICD-10-CM | POA: Diagnosis not present

## 2023-12-01 DIAGNOSIS — D225 Melanocytic nevi of trunk: Secondary | ICD-10-CM | POA: Diagnosis not present

## 2023-12-01 DIAGNOSIS — D235 Other benign neoplasm of skin of trunk: Secondary | ICD-10-CM | POA: Diagnosis not present

## 2023-12-18 ENCOUNTER — Other Ambulatory Visit: Payer: Self-pay | Admitting: Family Medicine

## 2023-12-18 DIAGNOSIS — I4892 Unspecified atrial flutter: Secondary | ICD-10-CM

## 2023-12-23 DIAGNOSIS — F4320 Adjustment disorder, unspecified: Secondary | ICD-10-CM | POA: Diagnosis not present

## 2024-01-20 DIAGNOSIS — F4320 Adjustment disorder, unspecified: Secondary | ICD-10-CM | POA: Diagnosis not present

## 2024-01-22 ENCOUNTER — Telehealth: Payer: Self-pay | Admitting: Family Medicine

## 2024-01-22 NOTE — Telephone Encounter (Signed)
 Copied from CRM [id number]. Topic: Medicare AWV >> Jan 22, 2024 11:16 AM Nathanel DEL wrote: Reason for CRM: Called LVM 01/22/2024 to schedule AWV. Please schedule Virtual or Telehealth visits ONLY.   Nathanel Paschal; Care Guide Ambulatory Clinical Support [place] [facility] Direct Dial: [phone]

## 2024-02-05 NOTE — Progress Notes (Signed)
 Pt did not answer phone for AVWS. This encounter was created in error - please disregard.

## 2024-02-10 ENCOUNTER — Ambulatory Visit (INDEPENDENT_AMBULATORY_CARE_PROVIDER_SITE_OTHER): Payer: HMO | Admitting: *Deleted

## 2024-02-10 VITALS — Ht 74.0 in | Wt 245.0 lb

## 2024-02-10 DIAGNOSIS — Z0000 Encounter for general adult medical examination without abnormal findings: Secondary | ICD-10-CM

## 2024-02-10 NOTE — Progress Notes (Signed)
 Subjective:   [name]. is a 78 y.o. male who presents for Medicare Annual/Subsequent preventive examination.  Visit Complete: Virtual I connected with  [name]. on 02/10/24 by a audio enabled telemedicine application and verified that I am speaking with the correct person using two identifiers.  Patient Location: Home  Provider Location: Office/Clinic  I discussed the limitations of evaluation and management by telemedicine. The patient expressed understanding and agreed to proceed.  Vital Signs: Because this visit was a virtual/telehealth visit, some criteria may be missing or patient reported. Any vitals not documented were not able to be obtained and vitals that have been documented are patient reported.  Cardiac Risk Factors include: advanced age (>55men, >64 women);diabetes mellitus;dyslipidemia;hypertension;male gender;obesity (BMI >30kg/m2)     Objective:    Today's Vitals   02/10/24 0825  Weight: 245 lb (111.1 kg)  Height: 6\' 2"  (1.88 m)  PainSc: 3    Body mass index is 31.46 kg/m.     02/10/2024    8:24 AM 02/04/2023    3:41 PM 02/21/2020    6:51 AM 09/22/2019    9:03 AM 08/27/2015    6:03 PM 08/16/2015   12:15 PM 02/04/2012    1:15 AM  Advanced Directives  Does Patient Have a Medical Advance Directive? Yes Yes Yes No No No Patient does not have advance directive;Patient would like information  Type of Advance Directive Healthcare Power of [place];Living will Healthcare Power of [place];Living will Healthcare Power of [place];Living will      Does patient want to make changes to medical advance directive? No - Patient declined        Copy of Healthcare Power of Attorney in Chart? No - copy requested No - copy requested No - copy requested      Would patient like information on creating a medical advance directive?    No - Patient declined  No - patient declined information Advance directive packet given  Pre-existing out of facility DNR order  (yellow form or pink MOST form)       No    Current Medications (verified) Outpatient Encounter Medications as of 02/10/2024  Medication Sig   ALPRAZolam (XANAX) 0.25 MG tablet Take 1 tablet (0.25 mg total) by mouth daily as needed for anxiety.   cetirizine (ZYRTEC) 10 MG tablet Take 10 mg by mouth daily.   chlorthalidone (HYGROTON) 25 MG tablet Take 1 tablet (25 mg total) by mouth daily.   diltiazem (CARDIZEM CD) 300 MG 24 hr capsule TAKE 1 CAPSULE BY MOUTH EVERY DAY   ELIQUIS 5 MG TABS tablet TAKE 1 TABLET BY MOUTH TWICE A DAY   metFORMIN (GLUCOPHAGE-XR) 500 MG 24 hr tablet Take 2 tablets (1,000 mg total) by mouth 2 (two) times daily.   metoprolol succinate (TOPROL-XL) 50 MG 24 hr tablet Take 1 tablet (50 mg total) by mouth daily. Take with or immediately following a meal   pravastatin (PRAVACHOL) 20 MG tablet Take 1 tablet (20 mg total) by mouth daily.   No facility-administered encounter medications on file as of 02/10/2024.    Allergies (verified) Ibuprofen, Lisinopril, Oxaprozin, Ziac [bisoprolol-hydrochlorothiazide], Bystolic [nebivolol hcl], Hctz [hydrochlorothiazide], and Norvasc [amlodipine besylate]   History: Past Medical History:  Diagnosis Date   Allergic angioedema    Allergy    Hypertension    Non-insulin dependent type 2 diabetes mellitus (HCC)    PAF (paroxysmal atrial fibrillation) (HCC)    Pneumonia    RAD (reactive airway disease)  Sleep apnea    C-pap intolerant   Tick bite    Past Surgical History:  Procedure Laterality Date   APPENDECTOMY     CARDIOVERSION N/A 02/21/2020   Procedure: CARDIOVERSION;  Surgeon: Chilton Si, MD;  Location: [facility] ENDOSCOPY;  Service: Cardiovascular;  Laterality: N/A;   CHOLECYSTECTOMY     COLONOSCOPY     stress myoview  12/13/2008   EF 61% LV  normal   TONSILLECTOMY     Family History  Problem Relation Age of Onset   Pancreatic cancer Mother    Hypertension Mother    Cancer Mother    Stroke Father     Hypertension Father    Hypertension Sister    Hypertension Brother    Cancer Maternal Grandfather    Hypertension Daughter    Asthma Son    Colon polyps Neg Hx    Esophageal cancer Neg Hx    Rectal cancer Neg Hx    Stomach cancer Neg Hx    Colon cancer Neg Hx    Social History   Socioeconomic History   Marital status: Married    Spouse name: Not on file   Number of children: Not on file   Years of education: Not on file   Highest education level: Some college, no degree  Occupational History   Not on file  Tobacco Use   Smoking status: Former    Current packs/day: 0.00    Types: Cigarettes, Pipe    Quit date: 10/05/1996    Years since quitting: 27.3   Smokeless tobacco: Former    Types: Chew   Tobacco comments:    Occasionally  Vaping Use   Vaping status: Never Used  Substance and Sexual Activity   Alcohol use: No    Alcohol/week: 0.0 standard drinks of alcohol   Drug use: No   Sexual activity: Yes    Birth control/protection: None  Other Topics Concern   Not on file  Social History Narrative   Not on file   Social Drivers of Health   Financial Resource Strain: Low Risk  (02/10/2024)   Overall Financial Resource Strain (CARDIA)    Difficulty of Paying Living Expenses: Not hard at all  Food Insecurity: No Food Insecurity (02/10/2024)   Hunger Vital Sign    Worried About Running Out of Food in the Last Year: Never true    Ran Out of Food in the Last Year: Never true  Transportation Needs: No Transportation Needs (02/10/2024)   PRAPARE - Administrator, Civil Service (Medical): No    Lack of Transportation (Non-Medical): No  Physical Activity: Sufficiently Active (02/10/2024)   Exercise Vital Sign    Days of Exercise per Week: 5 days    Minutes of Exercise per Session: 80 min  Stress: No Stress Concern Present (02/10/2024)   [employer] of Occupational Health - Occupational Stress Questionnaire    Feeling of Stress : Not at all  Social  Connections: Moderately Integrated (02/10/2024)   Social Connection and Isolation Panel [NHANES]    Frequency of Communication with Friends and Family: More than three times a week    Frequency of Social Gatherings with Friends and Family: More than three times a week    Attends Religious Services: More than 4 times per year    Active Member of [employer] or Organizations: Yes    Attends Banker Meetings: Never    Marital Status: Separated    Tobacco Counseling Counseling given: Not Answered Tobacco comments:  Occasionally   Clinical Intake:  Pre-visit preparation completed: Yes  Pain : 0-10 Pain Score: 3  Pain Location: Back Pain Orientation: Lower Pain Descriptors / Indicators: Aching Pain Onset: More than a month ago Pain Frequency: Intermittent   BMI - recorded: 31.46 Nutritional Status: BMI > 30  Obese Nutritional Risks: None Diabetes: Yes CBG done?: No Did pt. bring in CBG monitor from home?: No  How often do you need to have someone help you when you read instructions, pamphlets, or other written materials from your doctor or pharmacy?: 1 - Never  Interpreter Needed?: No  Information entered by :: Donne Anon, CMA   Activities of Daily Living    02/10/2024    8:27 AM  In your present state of health, do you have any difficulty performing the following activities:  Hearing? 0  Vision? 0  Difficulty concentrating or making decisions? 0  Walking or climbing stairs? 1  Dressing or bathing? 0  Doing errands, shopping? 0  Preparing Food and eating ? N  Using the Toilet? N  In the past six months, have you accidently leaked urine? N  Do you have problems with loss of bowel control? N  Managing your Medications? N  Managing your Finances? N  Housekeeping or managing your Housekeeping? N    Patient Care Team: Copland, Gwenlyn Found, MD as PCP - General (Family Medicine) Rennis Golden Lisette Abu, MD as PCP - Cardiology (Cardiology) Day, Dannielle Karvonen, [facility] (Inactive)  as Pharmacist (Pharmacist)  Indicate any recent Medical Services you may have received from other than [facility] providers in the past year (date may be approximate).     Assessment:   This is a routine wellness examination for Jennie.  Hearing/Vision screen No results found.   Goals Addressed   None    Depression Screen    02/10/2024    8:45 AM 03/25/2023    8:27 AM 02/04/2023    3:40 PM 12/18/2022    9:05 AM 09/25/2021    8:28 AM 03/28/2021   11:29 AM 02/13/2018   12:16 PM  PHQ 2/9 Scores  PHQ - 2 Score 0 0 0 0 0 0 0    Fall Risk    02/10/2024    8:30 AM 03/25/2023    8:27 AM 02/04/2023    3:42 PM 12/18/2022    9:04 AM 09/25/2021    8:29 AM  Fall Risk   Falls in the past year? 1 0 0 0 0  Number falls in past yr: 0 0 0 0 0  Injury with Fall? 0 0 0 0 0  Risk for fall due to : History of fall(s) No Fall Risks Impaired vision No Fall Risks   Follow up Falls evaluation completed Falls evaluation completed Falls prevention discussed Falls evaluation completed     MEDICARE RISK AT HOME: Medicare Risk at Home Any stairs in or around the home?: Yes If so, are there any without handrails?: No Home free of loose throw rugs in walkways, pet beds, electrical cords, etc?: Yes Adequate lighting in your home to reduce risk of falls?: Yes Life alert?: No Use of a cane, walker or w/c?: No Grab bars in the bathroom?: No Shower chair or bench in shower?: No Elevated toilet seat or a handicapped toilet?: No  TIMED UP AND GO:  Was the test performed?  No    Cognitive Function:        02/10/2024    8:40 AM 02/04/2023    3:43 PM  6CIT Screen  What Year? 0 points 0 points  What month? 0 points 0 points  What time? 0 points 0 points  Count back from 20 0 points 0 points  Months in reverse 0 points 0 points  Repeat phrase 0 points 0 points  Total Score 0 points 0 points    Immunizations Immunization History  Administered Date(s) Administered   Fluad Quad(high Dose 65+) 09/22/2022    Fluad Trivalent(High Dose 65+) 09/28/2023   Influenza, High Dose Seasonal PF 11/02/2017, 10/06/2018   Influenza, Quadrivalent, Recombinant, Inj, Pf 11/01/2019   Influenza-Unspecified 11/01/2014, 11/22/2015, 12/22/2020   Moderna Sars-Covid-2 Vaccination 01/17/2020, 02/16/2020, 08/07/2020   PFIZER Comirnaty(Gray Top)Covid-19 Tri-Sucrose Vaccine 05/29/2021   Pfizer Covid-19 Vaccine Bivalent Booster 70yrs & up 09/25/2021   Pneumococcal Conjugate-13 03/20/2015   Pneumococcal Polysaccharide-23 04/02/2017   Td 03/20/2016    TDAP status: Up to date  Flu Vaccine status: Up to date  Pneumococcal vaccine status: Up to date  Covid-19 vaccine status: Information provided on how to obtain vaccines.   Qualifies for Shingles Vaccine? Yes   Zostavax completed No   Shingrix Completed?: No.    Education has been provided regarding the importance of this vaccine. Patient has been advised to call insurance company to determine out of pocket expense if they have not yet received this vaccine. Advised may also receive vaccine at local pharmacy or Health Dept. Verbalized acceptance and understanding.  Screening Tests Health Maintenance  Topic Date Due   Zoster Vaccines- Shingrix (1 of 2) Never done   OPHTHALMOLOGY EXAM  10/14/2022   COVID-19 Vaccine (6 - 2024-25 season) 08/16/2023   Medicare Annual Wellness (AWV)  02/05/2024   Diabetic kidney evaluation - Urine ACR  03/24/2024   HEMOGLOBIN A1C  03/28/2024   Diabetic kidney evaluation - eGFR measurement  09/27/2024   FOOT EXAM  09/27/2024   Colonoscopy  10/27/2025   DTaP/Tdap/Td (2 - Tdap) 03/20/2026   Pneumonia Vaccine 37+ Years old  Completed   INFLUENZA VACCINE  Completed   Hepatitis C Screening  Completed   HPV VACCINES  Aged Out    Health Maintenance  Health Maintenance Due  Topic Date Due   Zoster Vaccines- Shingrix (1 of 2) Never done   OPHTHALMOLOGY EXAM  10/14/2022   COVID-19 Vaccine (6 - 2024-25 season) 08/16/2023   Medicare  Annual Wellness (AWV)  02/05/2024    Colorectal cancer screening: Type of screening: Colonoscopy. Completed 10/27/22. Repeat every 3 years  Lung Cancer Screening: (Low Dose CT Chest recommended if Age 15-80 years, 20 pack-year currently smoking OR have quit w/in 15years.) does not qualify.   Additional Screening:  Hepatitis C Screening: does qualify; Completed 08/24/15  Vision Screening: Recommended annual ophthalmology exams for early detection of glaucoma and other disorders of the eye. Is the patient up to date with their annual eye exam?  Yes  Who is the provider or what is the name of the office in which the patient attends annual eye exams? Can't remember name at this time If pt is not established with a provider, would they like to be referred to a provider to establish care? No .   Dental Screening: Recommended annual dental exams for proper oral hygiene  Diabetic Foot Exam: Diabetic Foot Exam: Completed 09/28/23  Community Resource Referral / Chronic Care Management: CRR required this visit?  No   CCM required this visit?  No     Plan:     I have personally reviewed and noted the following in the patient's chart:  Medical and social history Use of alcohol, tobacco or illicit drugs  Current medications and supplements including opioid prescriptions. Patient is not currently taking opioid prescriptions. Functional ability and status Nutritional status Physical activity Advanced directives List of other physicians Hospitalizations, surgeries, and ER visits in previous 12 months Vitals Screenings to include cognitive, depression, and falls Referrals and appointments  In addition, I have reviewed and discussed with patient certain preventive protocols, quality metrics, and best practice recommendations. A written personalized care plan for preventive services as well as general preventive health recommendations were provided to patient.     Donne Anon,  CMA   02/10/2024   After Visit Summary: (MyChart) Due to this being a telephonic visit, the after visit summary with patients personalized plan was offered to patient via MyChart   Nurse Notes: None

## 2024-02-10 NOTE — Patient Instructions (Signed)
 [name] , Thank you for taking time to come for your Medicare Wellness Visit. I appreciate your ongoing commitment to your health goals. Please review the following plan we discussed and let me know if I can assist you in the future.     This is a list of the screening recommended for you and due dates:  Health Maintenance  Topic Date Due   Zoster (Shingles) Vaccine (1 of 2) Never done   Eye exam for diabetics  10/14/2022   COVID-19 Vaccine (6 - 2024-25 season) 08/16/2023   Yearly kidney health urinalysis for diabetes  03/24/2024   Hemoglobin A1C  03/28/2024   Yearly kidney function blood test for diabetes  09/27/2024   Complete foot exam   09/27/2024   Medicare Annual Wellness Visit  02/09/2025   Colon Cancer Screening  10/27/2025   DTaP/Tdap/Td vaccine (2 - Tdap) 03/20/2026   Pneumonia Vaccine  Completed   Flu Shot  Completed   Hepatitis C Screening  Completed   HPV Vaccine  Aged Out    Next appointment: Follow up in one year for your annual wellness visit.   Preventive Care 55 Years and Older, Male Preventive care refers to lifestyle choices and visits with your health care provider that can promote health and wellness. What does preventive care include? A yearly physical exam. This is also called an annual well check. Dental exams once or twice a year. Routine eye exams. Ask your health care provider how often you should have your eyes checked. Personal lifestyle choices, including: Daily care of your teeth and gums. Regular physical activity. Eating a healthy diet. Avoiding tobacco and drug use. Limiting alcohol use. Practicing safe sex. Taking low doses of aspirin every day. Taking vitamin and mineral supplements as recommended by your health care provider. What happens during an annual well check? The services and screenings done by your health care provider during your annual well check will depend on your age, overall health, lifestyle risk factors, and family  history of disease. Counseling  Your health care provider may ask you questions about your: Alcohol use. Tobacco use. Drug use. Emotional well-being. Home and relationship well-being. Sexual activity. Eating habits. History of falls. Memory and ability to understand (cognition). Work and work Astronomer. Screening  You may have the following tests or measurements: Height, weight, and BMI. Blood pressure. Lipid and cholesterol levels. These may be checked every 5 years, or more frequently if you are over 17 years old. Skin check. Lung cancer screening. You may have this screening every year starting at age 75 if you have a 30-pack-year history of smoking and currently smoke or have quit within the past 15 years. Fecal occult blood test (FOBT) of the stool. You may have this test every year starting at age 63. Flexible sigmoidoscopy or colonoscopy. You may have a sigmoidoscopy every 5 years or a colonoscopy every 10 years starting at age 54. Prostate cancer screening. Recommendations will vary depending on your family history and other risks. Hepatitis C blood test. Hepatitis B blood test. Sexually transmitted disease (STD) testing. Diabetes screening. This is done by checking your blood sugar (glucose) after you have not eaten for a while (fasting). You may have this done every 1-3 years. Abdominal aortic aneurysm (AAA) screening. You may need this if you are a current or former smoker. Osteoporosis. You may be screened starting at age 8 if you are at high risk. Talk with your health care provider about your test results, treatment options,  and if necessary, the need for more tests. Vaccines  Your health care provider may recommend certain vaccines, such as: Influenza vaccine. This is recommended every year. Tetanus, diphtheria, and acellular pertussis (Tdap, Td) vaccine. You may need a Td booster every 10 years. Zoster vaccine. You may need this after age 68. Pneumococcal  13-valent conjugate (PCV13) vaccine. One dose is recommended after age 61. Pneumococcal polysaccharide (PPSV23) vaccine. One dose is recommended after age 97. Talk to your health care provider about which screenings and vaccines you need and how often you need them. This information is not intended to replace advice given to you by your health care provider. Make sure you discuss any questions you have with your health care provider. Document Released: 12/28/2015 Document Revised: 08/20/2016 Document Reviewed: 10/02/2015 Elsevier Interactive Patient Education  2017 [employer].  Fall Prevention in the Home Falls can cause injuries. They can happen to people of all ages. There are many things you can do to make your home safe and to help prevent falls. What can I do on the outside of my home? Regularly fix the edges of walkways and driveways and fix any cracks. Remove anything that might make you trip as you walk through a door, such as a raised step or threshold. Trim any bushes or trees on the path to your home. Use bright outdoor lighting. Clear any walking paths of anything that might make someone trip, such as rocks or tools. Regularly check to see if handrails are loose or broken. Make sure that both sides of any steps have handrails. Any raised decks and porches should have guardrails on the edges. Have any leaves, snow, or ice cleared regularly. Use sand or salt on walking paths during winter. Clean up any spills in your garage right away. This includes oil or grease spills. What can I do in the bathroom? Use night lights. Install grab bars by the toilet and in the tub and shower. Do not use towel bars as grab bars. Use non-skid mats or decals in the tub or shower. If you need to sit down in the shower, use a plastic, non-slip stool. Keep the floor dry. Clean up any water that spills on the floor as soon as it happens. Remove soap buildup in the tub or shower regularly. Attach  bath mats securely with double-sided non-slip rug tape. Do not have throw rugs and other things on the floor that can make you trip. What can I do in the bedroom? Use night lights. Make sure that you have a light by your bed that is easy to reach. Do not use any sheets or blankets that are too big for your bed. They should not hang down onto the floor. Have a firm chair that has side arms. You can use this for support while you get dressed. Do not have throw rugs and other things on the floor that can make you trip. What can I do in the kitchen? Clean up any spills right away. Avoid walking on wet floors. Keep items that you use a lot in easy-to-reach places. If you need to reach something above you, use a strong step stool that has a grab bar. Keep electrical cords out of the way. Do not use floor polish or wax that makes floors slippery. If you must use wax, use non-skid floor wax. Do not have throw rugs and other things on the floor that can make you trip. What can I do with my stairs? Do not leave  any items on the stairs. Make sure that there are handrails on both sides of the stairs and use them. Fix handrails that are broken or loose. Make sure that handrails are as long as the stairways. Check any carpeting to make sure that it is firmly attached to the stairs. Fix any carpet that is loose or worn. Avoid having throw rugs at the top or bottom of the stairs. If you do have throw rugs, attach them to the floor with carpet tape. Make sure that you have a light switch at the top of the stairs and the bottom of the stairs. If you do not have them, ask someone to add them for you. What else can I do to help prevent falls? Wear shoes that: Do not have high heels. Have rubber bottoms. Are comfortable and fit you well. Are closed at the toe. Do not wear sandals. If you use a stepladder: Make sure that it is fully opened. Do not climb a closed stepladder. Make sure that both sides of the  stepladder are locked into place. Ask someone to hold it for you, if possible. Clearly mark and make sure that you can see: Any grab bars or handrails. First and last steps. Where the edge of each step is. Use tools that help you move around (mobility aids) if they are needed. These include: Canes. Walkers. Scooters. Crutches. Turn on the lights when you go into a dark area. Replace any light bulbs as soon as they burn out. Set up your furniture so you have a clear path. Avoid moving your furniture around. If any of your floors are uneven, fix them. If there are any pets around you, be aware of where they are. Review your medicines with your doctor. Some medicines can make you feel dizzy. This can increase your chance of falling. Ask your doctor what other things that you can do to help prevent falls. This information is not intended to replace advice given to you by your health care provider. Make sure you discuss any questions you have with your health care provider. Document Released: 09/27/2009 Document Revised: 05/08/2016 Document Reviewed: 01/05/2015 Elsevier Interactive Patient Education  2017 [employer].

## 2024-02-17 DIAGNOSIS — F4320 Adjustment disorder, unspecified: Secondary | ICD-10-CM | POA: Diagnosis not present

## 2024-02-20 ENCOUNTER — Other Ambulatory Visit: Payer: Self-pay | Admitting: Family Medicine

## 2024-02-20 DIAGNOSIS — E119 Type 2 diabetes mellitus without complications: Secondary | ICD-10-CM

## 2024-02-20 DIAGNOSIS — R739 Hyperglycemia, unspecified: Secondary | ICD-10-CM

## 2024-03-15 DIAGNOSIS — D485 Neoplasm of uncertain behavior of skin: Secondary | ICD-10-CM | POA: Diagnosis not present

## 2024-03-15 DIAGNOSIS — Z85828 Personal history of other malignant neoplasm of skin: Secondary | ICD-10-CM | POA: Diagnosis not present

## 2024-03-15 DIAGNOSIS — B079 Viral wart, unspecified: Secondary | ICD-10-CM | POA: Diagnosis not present

## 2024-03-16 DIAGNOSIS — F4320 Adjustment disorder, unspecified: Secondary | ICD-10-CM | POA: Diagnosis not present

## 2024-03-22 ENCOUNTER — Other Ambulatory Visit: Payer: Self-pay | Admitting: Family Medicine

## 2024-03-22 DIAGNOSIS — I10 Essential (primary) hypertension: Secondary | ICD-10-CM

## 2024-03-28 ENCOUNTER — Ambulatory Visit: Payer: Medicare HMO | Admitting: Family Medicine

## 2024-04-13 DIAGNOSIS — F4320 Adjustment disorder, unspecified: Secondary | ICD-10-CM | POA: Diagnosis not present

## 2024-04-14 ENCOUNTER — Other Ambulatory Visit: Payer: Self-pay | Admitting: Family Medicine

## 2024-04-14 DIAGNOSIS — I10 Essential (primary) hypertension: Secondary | ICD-10-CM

## 2024-05-01 ENCOUNTER — Other Ambulatory Visit: Payer: Self-pay | Admitting: Family Medicine

## 2024-05-01 DIAGNOSIS — I4892 Unspecified atrial flutter: Secondary | ICD-10-CM

## 2024-05-12 DIAGNOSIS — F4320 Adjustment disorder, unspecified: Secondary | ICD-10-CM | POA: Diagnosis not present

## 2024-05-14 ENCOUNTER — Other Ambulatory Visit: Payer: Self-pay | Admitting: Family Medicine

## 2024-05-14 DIAGNOSIS — E785 Hyperlipidemia, unspecified: Secondary | ICD-10-CM

## 2024-06-06 DIAGNOSIS — F4320 Adjustment disorder, unspecified: Secondary | ICD-10-CM | POA: Diagnosis not present

## 2024-07-06 DIAGNOSIS — F4320 Adjustment disorder, unspecified: Secondary | ICD-10-CM | POA: Diagnosis not present

## 2024-07-09 ENCOUNTER — Other Ambulatory Visit: Payer: Self-pay | Admitting: Family Medicine

## 2024-07-09 DIAGNOSIS — I10 Essential (primary) hypertension: Secondary | ICD-10-CM

## 2024-07-11 ENCOUNTER — Telehealth: Payer: Self-pay

## 2024-07-11 DIAGNOSIS — I10 Essential (primary) hypertension: Secondary | ICD-10-CM

## 2024-07-11 MED ORDER — DILTIAZEM HCL ER COATED BEADS 300 MG PO CP24: 300.0000 mg | ORAL_CAPSULE | Freq: Every day | ORAL | 0 refills | Status: DC

## 2024-07-11 MED ORDER — CHLORTHALIDONE 25 MG PO TABS: 25.0000 mg | ORAL_TABLET | Freq: Every day | ORAL | 0 refills | Status: DC

## 2024-07-11 NOTE — Telephone Encounter (Signed)
 Copied from CRM [id number]. Topic: Clinical - Prescription Issue >> Jul 11, 2024 10:23 AM [name] wrote: Reason for CRM: Patient is calling in stating the pharmacy needs authorization to refill his medications. Patient is unsure of which medications it is for, he just stated what ever needs filled. Patient would like the office to reach out to [facility].   Patient is also asking if Dr. Watt would like to see him as well. Please advise patient as he does not remember ever cancelling his physical.

## 2024-07-11 NOTE — Telephone Encounter (Signed)
 Spoke w/ Pt- informed he is due for appt. Appt scheduled. Rx for chlorthalidone  and diltiazem  refilled.

## 2024-07-20 NOTE — Progress Notes (Signed)
 [facility] [address], [address] [place], [place] [address] [phone]  Date:  07/25/2024   Name:  [name].   DOB:  04-15-1946   MRN:  [id number]  PCP:  Watt Harlene BROCKS, MD    Chief Complaint: Follow-up (Pt states  I can eat hardly nothing and my blood sugar stays high but I don't want to take a shit)   History of Present Illness:  [name]. is a 78 y.o. very pleasant male patient who presents with the following:  Patient seen today for medication follow-up.  I saw him most recently in October- history of atrial fibrillation, hypertension, diabetes, obstructive sleep apnea, polycythemia and testosterone  use  I recommended that he consult with urology regarding an uptrending PSA at our last visit but he did not respond to me ?  Is he using his MyChart.  If not I will turn it off.  He states he does want to use it and will try to better by checking messages.  I will follow-up on PSA today and we can take it from there  It also looks like he has not followed up with cardiology in some time- I will reach out to Dr Mona for him as he likely needs follow-up  Alprazolam  as needed Eliquis  Chlorthalidone  25 Diltiazem  300 Metformin - 1000 BID Toprol -XL Pravastatin  He is still using testosterone  from an outside doctor using a compounding pharmacy; this does not appear in PDMP  Lab Results  Component Value Date   HGBA1C 7.4 (H) 09/28/2023   Colon cancer screening is up-to-date  He notes his blood sugar has been running high despite trying to eat a healthy diet He may test up to about 220 at home  He is never seeing glucose of 100   He saw his eye doctor and all is well  He notes he continues to feel well and has plenty of energy  Patient Active Problem List   Diagnosis Date Noted   Typical atrial flutter (HCC)    Anticoagulated 02/15/2020   Sleep apnea 01/24/2020   Controlled type 2 diabetes  mellitus without complication, without long-term current use of insulin (HCC) 04/02/2017   Left hip pain 02/25/2016   CMV infection, acute (HCC) 09/11/2015   Low HDL (under 40) 07/12/2013   History of angioedema 11/08/2012   Paroxysmal atrial flutter (HCC) 02/04/2012   Essential hypertension 06/15/2009   BRONCHOSPASM 06/14/2009    Past Medical History:  Diagnosis Date   Allergic angioedema    Allergy    Hypertension    Non-insulin dependent type 2 diabetes mellitus (HCC)    PAF (paroxysmal atrial fibrillation) (HCC)    Pneumonia    RAD (reactive airway disease)    Sleep apnea    C-pap intolerant   Tick bite     Past Surgical History:  Procedure Laterality Date   APPENDECTOMY     CARDIOVERSION N/A 02/21/2020   Procedure: CARDIOVERSION;  Surgeon: Raford Riggs, MD;  Location: [facility] ENDOSCOPY;  Service: Cardiovascular;  Laterality: N/A;   CHOLECYSTECTOMY     COLONOSCOPY     stress myoview  12/13/2008   EF 61% LV  normal   TONSILLECTOMY      Social History   Tobacco Use   Smoking status: Former    Current packs/day: 0.00    Types: Cigarettes, Pipe    Quit date: 10/05/1996    Years since quitting: 27.8   Smokeless  tobacco: Former    Types: Chew   Tobacco comments:    Occasionally  Vaping Use   Vaping status: Never Used  Substance Use Topics   Alcohol use: No    Alcohol/week: 0.0 standard drinks of alcohol   Drug use: No    Family History  Problem Relation Age of Onset   Pancreatic cancer Mother    Hypertension Mother    Cancer Mother    Stroke Father    Hypertension Father    Hypertension Sister    Hypertension Brother    Cancer Maternal Grandfather    Hypertension Daughter    Asthma Son    Colon polyps Neg Hx    Esophageal cancer Neg Hx    Rectal cancer Neg Hx    Stomach cancer Neg Hx    Colon cancer Neg Hx     Allergies  Allergen Reactions   Ibuprofen Swelling   Lisinopril Swelling    Swelling of face and lips    Oxaprozin Swelling     Swelling of face and lips   Ziac [Bisoprolol-Hydrochlorothiazide] Swelling    Per patient records from Dr. Madelin Juneau.   Bystolic [Nebivolol Hcl] Itching   Hctz [Hydrochlorothiazide] Swelling   Norvasc [Amlodipine Besylate]     Unknown     Medication list has been reviewed and updated.  Current Outpatient Medications on File Prior to Visit  Medication Sig Dispense Refill   ALPRAZolam  (XANAX ) 0.25 MG tablet Take 1 tablet (0.25 mg total) by mouth daily as needed for anxiety. 15 tablet 0   apixaban  (ELIQUIS ) 5 MG TABS tablet Take 1 tablet (5 mg total) by mouth 2 (two) times daily. Appt for refills 180 tablet 0   cetirizine (ZYRTEC) 10 MG tablet Take 10 mg by mouth daily.     chlorthalidone  (HYGROTON ) 25 MG tablet Take 1 tablet (25 mg total) by mouth daily. 30 tablet 0   diltiazem  (CARDIZEM  CD) 300 MG 24 hr capsule Take 1 capsule (300 mg total) by mouth daily. 30 capsule 0   metFORMIN  (GLUCOPHAGE -XR) 500 MG 24 hr tablet TAKE 2 TABLETS BY MOUTH TWICE A DAY 360 tablet 3   metoprolol  succinate (TOPROL -XL) 50 MG 24 hr tablet Take 1 tablet (50 mg total) by mouth daily. Take with or immediately following a meal 90 tablet 1   pravastatin  (PRAVACHOL ) 20 MG tablet TAKE 1 TABLET BY MOUTH EVERY DAY 90 tablet 3   No current facility-administered medications on file prior to visit.    Review of Systems:  As per HPI- otherwise negative.   Physical Examination: Vitals:   07/25/24 0942  BP: 128/66  Pulse: 67  SpO2: 96%   Vitals:   07/25/24 0942  Weight: 246 lb (111.6 kg)  Height: 6' 2 (1.88 m)   Body mass index is 31.58 kg/m. Ideal Body Weight: Weight in (lb) to have BMI = 25: 194.3  GEN: no acute distress.  Tall build, looks well except plethoric; suspect polycythemia HEENT: Atraumatic, Normocephalic.  Ears and Nose: No external deformity. CV: RRR, No M/G/R. No JVD. No thrill. No extra heart sounds. PULM: CTA B, no wheezes, crackles, rhonchi. No retractions. No resp. distress. No  accessory muscle use. EXTR: No c/c/e PSYCH: Normally interactive. Conversant.    Assessment and Plan: Increased prostate specific antigen (PSA) velocity - Plan: PSA, Microalbumin / creatinine urine ratio  Anticoagulated - Plan: CBC  Controlled type 2 diabetes mellitus without complication, without long-term current use of insulin (HCC) - Plan: Comprehensive metabolic panel with  GFR, Hemoglobin A1c  Hyperlipidemia, unspecified hyperlipidemia type - Plan: Lipid panel  Paroxysmal atrial flutter (HCC) - Plan: metoprolol  succinate (TOPROL -XL) 50 MG 24 hr tablet  Essential hypertension - Plan: CBC, Comprehensive metabolic panel with GFR, chlorthalidone  (HYGROTON ) 25 MG tablet, diltiazem  (CARDIZEM  CD) 300 MG 24 hr capsule  Unspecified atrial flutter (HCC) - Plan: apixaban  (ELIQUIS ) 5 MG TABS tablet  Patient seen today for follow-up.  We have noticed an increase in his PSA though absolute number is still in normal range.  I will check PSA today and refer him back to urology if needed.  As above he is using compounded testosterone  from an outside physician.  Sinus rhythm today, refilled metoprolol  and Eliquis .  I will reach out to his cardiologist for potential follow-up visit  Patient is concerned his A1c may be elevated.  He does not wish to use a shot; we might consider adding oral GLP-1 to his metformin   Will plan further follow- up pending labs.   Signed Harlene Schroeder, MD  Received labs as below, message to patient  Results for orders placed or performed in visit on 07/25/24  CBC   Collection Time: 07/25/24 10:15 AM  Result Value Ref Range   WBC 6.5 4.0 - 10.5 K/uL   RBC 5.70 4.22 - 5.81 Mil/uL   Platelets 306.0 150.0 - 400.0 K/uL   Hemoglobin 15.4 13.0 - 17.0 g/dL   HCT 54.2 60.9 - 47.9 %   MCV 80.2 78.0 - 100.0 fl   MCHC 33.7 30.0 - 36.0 g/dL   RDW 83.9 (H) 88.4 - 84.4 %  Comprehensive metabolic panel with GFR   Collection Time: 07/25/24 10:15 AM  Result Value Ref  Range   Sodium 135 135 - 145 mEq/L   Potassium 4.9 3.5 - 5.1 mEq/L   Chloride 96 96 - 112 mEq/L   CO2 32 19 - 32 mEq/L   Glucose, Bld 193 (H) 70 - 99 mg/dL   BUN 19 6 - 23 mg/dL   Creatinine, Ser 8.74 0.40 - 1.50 mg/dL   Total Bilirubin 0.5 0.2 - 1.2 mg/dL   Alkaline Phosphatase 58 39 - 117 U/L   AST 14 0 - 37 U/L   ALT 13 0 - 53 U/L   Total Protein 6.8 6.0 - 8.3 g/dL   Albumin 4.0 3.5 - 5.2 g/dL   GFR 43.74 (L) >39.99 mL/min   Calcium  9.6 8.4 - 10.5 mg/dL  Hemoglobin J8r   Collection Time: 07/25/24 10:15 AM  Result Value Ref Range   Hgb A1c MFr Bld 8.5 (H) 4.6 - 6.5 %  Lipid panel   Collection Time: 07/25/24 10:15 AM  Result Value Ref Range   Cholesterol 146 0 - 200 mg/dL   Triglycerides 890.9 0.0 - 149.0 mg/dL   HDL 66.69 (L) >60.99 mg/dL   VLDL 78.1 0.0 - 59.9 mg/dL   LDL Cholesterol 91 0 - 99 mg/dL   Total CHOL/HDL Ratio 4    NonHDL 112.34   PSA   Collection Time: 07/25/24 10:15 AM  Result Value Ref Range   PSA 1.94 0.10 - 4.00 ng/mL  Microalbumin / creatinine urine ratio   Collection Time: 07/25/24 10:15 AM  Result Value Ref Range   Microalb, Ur 8.7 (H) 0.0 - 1.9 mg/dL   Creatinine,U 40.7 mg/dL   Microalb Creat Ratio 146.4 (H) 0.0 - 30.0 mg/g

## 2024-07-20 NOTE — Patient Instructions (Addendum)
 Recommend flu shot, COVID booster this fall Also recommend that you get the shingles vaccine series and 1 dose of RSV at your pharmacy I will be in touch with your labs asap and will reach out to Dr Mona for you as well

## 2024-07-25 ENCOUNTER — Encounter: Payer: Self-pay | Admitting: Family Medicine

## 2024-07-25 ENCOUNTER — Ambulatory Visit: Admitting: Family Medicine

## 2024-07-25 VITALS — BP 128/66 | HR 67 | Ht 74.0 in | Wt 246.0 lb

## 2024-07-25 DIAGNOSIS — R972 Elevated prostate specific antigen [PSA]: Secondary | ICD-10-CM

## 2024-07-25 DIAGNOSIS — Z7901 Long term (current) use of anticoagulants: Secondary | ICD-10-CM

## 2024-07-25 DIAGNOSIS — E785 Hyperlipidemia, unspecified: Secondary | ICD-10-CM

## 2024-07-25 DIAGNOSIS — I10 Essential (primary) hypertension: Secondary | ICD-10-CM | POA: Diagnosis not present

## 2024-07-25 DIAGNOSIS — I4892 Unspecified atrial flutter: Secondary | ICD-10-CM

## 2024-07-25 DIAGNOSIS — R809 Proteinuria, unspecified: Secondary | ICD-10-CM

## 2024-07-25 DIAGNOSIS — Z7984 Long term (current) use of oral hypoglycemic drugs: Secondary | ICD-10-CM | POA: Diagnosis not present

## 2024-07-25 DIAGNOSIS — E119 Type 2 diabetes mellitus without complications: Secondary | ICD-10-CM

## 2024-07-25 DIAGNOSIS — R3129 Other microscopic hematuria: Secondary | ICD-10-CM

## 2024-07-25 LAB — COMPREHENSIVE METABOLIC PANEL WITH GFR
ALT: 13 U/L (ref 0–53)
AST: 14 U/L (ref 0–37)
Albumin: 4 g/dL (ref 3.5–5.2)
Alkaline Phosphatase: 58 U/L (ref 39–117)
BUN: 19 mg/dL (ref 6–23)
CO2: 32 meq/L (ref 19–32)
Calcium: 9.6 mg/dL (ref 8.4–10.5)
Chloride: 96 meq/L (ref 96–112)
Creatinine, Ser: 1.25 mg/dL (ref 0.40–1.50)
GFR: 56.25 mL/min — ABNORMAL LOW (ref >60.00)
Glucose, Bld: 193 mg/dL — ABNORMAL HIGH (ref 70–99)
Potassium: 4.9 meq/L (ref 3.5–5.1)
Sodium: 135 meq/L (ref 135–145)
Total Bilirubin: 0.5 mg/dL (ref 0.2–1.2)
Total Protein: 6.8 g/dL (ref 6.0–8.3)

## 2024-07-25 LAB — LIPID PANEL
Cholesterol: 146 mg/dL (ref 0–200)
HDL: 33.3 mg/dL — ABNORMAL LOW (ref >39.00)
LDL Cholesterol: 91 mg/dL (ref 0–99)
NonHDL: 112.34
Total CHOL/HDL Ratio: 4
Triglycerides: 109 mg/dL (ref 0.0–149.0)
VLDL: 21.8 mg/dL (ref 0.0–40.0)

## 2024-07-25 LAB — CBC
HCT: 45.7 % (ref 39.0–52.0)
Hemoglobin: 15.4 g/dL (ref 13.0–17.0)
MCHC: 33.7 g/dL (ref 30.0–36.0)
MCV: 80.2 fl (ref 78.0–100.0)
Platelets: 306 K/uL (ref 150.0–400.0)
RBC: 5.7 Mil/uL (ref 4.22–5.81)
RDW: 16 % — ABNORMAL HIGH (ref 11.5–15.5)
WBC: 6.5 K/uL (ref 4.0–10.5)

## 2024-07-25 LAB — MICROALBUMIN / CREATININE URINE RATIO
Creatinine,U: 59.2 mg/dL
Microalb Creat Ratio: 146.4 mg/g — ABNORMAL HIGH (ref 0.0–30.0)
Microalb, Ur: 8.7 mg/dL — ABNORMAL HIGH (ref 0.0–1.9)

## 2024-07-25 LAB — PSA: PSA: 1.94 ng/mL (ref 0.10–4.00)

## 2024-07-25 LAB — HEMOGLOBIN A1C: Hgb A1c MFr Bld: 8.5 % — ABNORMAL HIGH (ref 4.6–6.5)

## 2024-07-25 MED ORDER — CHLORTHALIDONE 25 MG PO TABS: 25.0000 mg | ORAL_TABLET | Freq: Every day | ORAL | 3 refills | Status: AC

## 2024-07-25 MED ORDER — DILTIAZEM HCL ER COATED BEADS 300 MG PO CP24: 300.0000 mg | ORAL_CAPSULE | Freq: Every day | ORAL | 3 refills | Status: AC

## 2024-07-25 MED ORDER — METOPROLOL SUCCINATE ER 50 MG PO TB24: 50.0000 mg | ORAL_TABLET | Freq: Every day | ORAL | 3 refills | Status: AC

## 2024-07-25 MED ORDER — APIXABAN 5 MG PO TABS: 5.0000 mg | ORAL_TABLET | Freq: Two times a day (BID) | ORAL | 3 refills | Status: AC

## 2024-07-28 MED ORDER — EMPAGLIFLOZIN 10 MG PO TABS: 10.0000 mg | ORAL_TABLET | Freq: Every day | ORAL | 3 refills | Status: AC

## 2024-08-22 DIAGNOSIS — F4320 Adjustment disorder, unspecified: Secondary | ICD-10-CM | POA: Diagnosis not present

## 2024-08-30 ENCOUNTER — Ambulatory Visit (INDEPENDENT_AMBULATORY_CARE_PROVIDER_SITE_OTHER): Admitting: Urology

## 2024-08-30 ENCOUNTER — Encounter: Payer: Self-pay | Admitting: Urology

## 2024-08-30 VITALS — BP 147/82 | HR 79 | Ht 74.0 in | Wt 250.0 lb

## 2024-08-30 DIAGNOSIS — E291 Testicular hypofunction: Secondary | ICD-10-CM | POA: Diagnosis not present

## 2024-08-30 DIAGNOSIS — R972 Elevated prostate specific antigen [PSA]: Secondary | ICD-10-CM | POA: Diagnosis not present

## 2024-08-30 LAB — MICROSCOPIC EXAMINATION

## 2024-08-30 LAB — URINALYSIS, ROUTINE W REFLEX MICROSCOPIC
Bilirubin, UA: NEGATIVE
Ketones, UA: NEGATIVE
Leukocytes,UA: NEGATIVE
Nitrite, UA: NEGATIVE
Protein,UA: NEGATIVE
Specific Gravity, UA: 1.01 (ref 1.005–1.030)
Urobilinogen, Ur: 0.2 mg/dL (ref 0.2–1.0)
pH, UA: 5.5 (ref 5.0–7.5)

## 2024-08-30 NOTE — Progress Notes (Signed)
 Assessment: 1. Rising PSA level   2. Hypogonadism in male      Plan: I personally reviewed the patient's chart including provider notes, lab results. His PSA has slightly increased but remains normal for his age.  The level of increase is consistent with normal growth of the prostate. I discussed management of hypogonadism including topical therapy, oral therapy, short acting injections. Recommend checking his testosterone  level and CBC today.  Will contact him with results. Return to office in 6 months.  Chief Complaint:  Chief Complaint  Patient presents with   Rising PSA    History of Present Illness:  [name]. is a 78 y.o. male who is seen in consultation from Copland, Harlene BROCKS, MD for evaluation of rising PSA.  PSA results: 4/22 0.73 10/22 0.73 10/23 1.53 4/24 1.39 10/24 1.57 8/25 1.94  He is on testosterone  injections prescribed by a wellness clinic in [place].  He is currently injecting 1/2 mL (question concentration) twice a week.  His last injection was 2 days ago.  He reports symptomatic improvement with the injections.  He is currently doing IM injections.  He is not aware of any recent lab values.  He is not having any significant lower urinary tract symptoms.  He does have occasional nocturia 1-2 times.  No dysuria or gross hematuria. IPSS = 4/2.   Past Medical History:  Past Medical History:  Diagnosis Date   Allergic angioedema    Allergy    Hypertension    Non-insulin dependent type 2 diabetes mellitus (HCC)    PAF (paroxysmal atrial fibrillation) (HCC)    Pneumonia    RAD (reactive airway disease)    Sleep apnea    C-pap intolerant   Tick bite     Past Surgical History:  Past Surgical History:  Procedure Laterality Date   APPENDECTOMY     CARDIOVERSION N/A 02/21/2020   Procedure: CARDIOVERSION;  Surgeon: Raford Riggs, MD;  Location: [facility] ENDOSCOPY;  Service: Cardiovascular;  Laterality: N/A;   CHOLECYSTECTOMY      COLONOSCOPY     stress myoview  12/13/2008   EF 61% LV  normal   TONSILLECTOMY      Allergies:  Allergies  Allergen Reactions   Ibuprofen Swelling   Lisinopril Swelling    Swelling of face and lips    Oxaprozin Swelling    Swelling of face and lips   Ziac [Bisoprolol-Hydrochlorothiazide] Swelling    Per patient records from Dr. Madelin Juneau.   Bystolic [Nebivolol Hcl] Itching   Hctz [Hydrochlorothiazide] Swelling   Norvasc [Amlodipine Besylate]     Unknown     Family History:  Family History  Problem Relation Age of Onset   Pancreatic cancer Mother    Hypertension Mother    Cancer Mother    Stroke Father    Hypertension Father    Hypertension Sister    Hypertension Brother    Cancer Maternal Grandfather    Hypertension Daughter    Asthma Son    Colon polyps Neg Hx    Esophageal cancer Neg Hx    Rectal cancer Neg Hx    Stomach cancer Neg Hx    Colon cancer Neg Hx     Social History:  Social History   Tobacco Use   Smoking status: Former    Current packs/day: 0.00    Types: Cigarettes, Pipe    Quit date: 10/05/1996    Years since quitting: 27.9   Smokeless tobacco: Former    Types: Sports administrator  Tobacco comments:    Occasionally  Vaping Use   Vaping status: Never Used  Substance Use Topics   Alcohol use: No    Alcohol/week: 0.0 standard drinks of alcohol   Drug use: No    Review of symptoms:  Constitutional:  Negative for unexplained weight loss, night sweats, fever, chills ENT:  Negative for nose bleeds, sinus pain, painful swallowing CV:  Negative for chest pain, shortness of breath, exercise intolerance, palpitations, loss of consciousness Resp:  Negative for cough, wheezing, shortness of breath GI:  Negative for nausea, vomiting, diarrhea, bloody stools GU:  Positives noted in HPI; otherwise negative for gross hematuria, dysuria, urinary incontinence Neuro:  Negative for seizures, poor balance, limb weakness, slurred speech Psych:  Negative for lack  of energy, depression, anxiety Endocrine:  Negative for polydipsia, polyuria, symptoms of hypoglycemia (dizziness, hunger, sweating) Hematologic:  Negative for anemia, purpura, petechia, prolonged or excessive bleeding, use of anticoagulants  Allergic:  Negative for difficulty breathing or choking as a result of exposure to anything; no shellfish allergy; no allergic response (rash/itch) to materials, foods  Physical exam: BP (!) 147/82   Pulse 79   Ht 6' 2 (1.88 m)   Wt 250 lb (113.4 kg)   BMI 32.10 kg/m  GENERAL APPEARANCE:  Well appearing, well developed, well nourished, NAD HEENT: Atraumatic, Normocephalic, oropharynx clear. NECK: Supple without lymphadenopathy or thyromegaly. LUNGS: Clear to auscultation bilaterally. HEART: Regular Rate and Rhythm without murmurs, gallops, or rubs. ABDOMEN: Soft, non-tender, No Masses. EXTREMITIES: Moves all extremities well.  Without clubbing, cyanosis, or edema. NEUROLOGIC:  Alert and oriented x 3, normal gait, CN II-XII grossly intact.  MENTAL STATUS:  Appropriate. BACK:  Non-tender to palpation.  No CVAT SKIN:  Warm, dry and intact.   GU: Penis:  circumcised Meatus: Slight erythema Scrotum: normal, no masses Testis: normal without masses bilateral Prostate: 30 g, NT, no nodules Rectum: Normal tone,  no masses or tenderness   Results: U/A: 0-5 WBCs, 0-2 RBCs

## 2024-08-31 LAB — CBC
Hematocrit: 51.4 % — ABNORMAL HIGH (ref 37.5–51.0)
Hemoglobin: 16.6 g/dL (ref 13.0–17.7)
MCH: 26.7 pg (ref 26.6–33.0)
MCHC: 32.3 g/dL (ref 31.5–35.7)
MCV: 83 fL (ref 79–97)
Platelets: 326 x10E3/uL (ref 150–450)
RBC: 6.22 x10E6/uL — ABNORMAL HIGH (ref 4.14–5.80)
RDW: 15.8 % — ABNORMAL HIGH (ref 11.6–15.4)
WBC: 7.3 x10E3/uL (ref 3.4–10.8)

## 2024-08-31 LAB — TESTOSTERONE: Testosterone: 987 ng/dL — ABNORMAL HIGH (ref 264–916)

## 2024-09-01 ENCOUNTER — Ambulatory Visit: Payer: Self-pay | Admitting: Urology

## 2024-09-01 NOTE — Telephone Encounter (Signed)
 Pt called back and lvm for a return call. Please advise.

## 2024-09-06 ENCOUNTER — Other Ambulatory Visit: Payer: Self-pay | Admitting: Urology

## 2024-10-04 ENCOUNTER — Ambulatory Visit: Attending: Internal Medicine | Admitting: Internal Medicine

## 2024-10-04 VITALS — BP 114/74 | HR 74 | Ht 74.0 in | Wt 244.8 lb

## 2024-10-04 DIAGNOSIS — E119 Type 2 diabetes mellitus without complications: Secondary | ICD-10-CM | POA: Diagnosis not present

## 2024-10-04 DIAGNOSIS — I4892 Unspecified atrial flutter: Secondary | ICD-10-CM | POA: Diagnosis not present

## 2024-10-04 DIAGNOSIS — E785 Hyperlipidemia, unspecified: Secondary | ICD-10-CM | POA: Diagnosis not present

## 2024-10-04 DIAGNOSIS — I10 Essential (primary) hypertension: Secondary | ICD-10-CM | POA: Diagnosis not present

## 2024-10-04 DIAGNOSIS — I2510 Atherosclerotic heart disease of native coronary artery without angina pectoris: Secondary | ICD-10-CM | POA: Diagnosis not present

## 2024-10-04 MED ORDER — ROSUVASTATIN CALCIUM 20 MG PO TABS: 20.0000 mg | ORAL_TABLET | Freq: Every day | ORAL | 3 refills | Status: AC

## 2024-10-04 NOTE — Progress Notes (Signed)
 OFFICE NOTE  Chief Complaint:  Follow-up  Primary Care Physician: Watt Harlene BROCKS, MD  HPI:  [name]. is a 78 year old gentleman with a history of atrial flutter which is paroxysmal. He was discharged on Xarelto  and had an outpatient cardioversion, was found to be in sinus spontaneously. He did feel much better after he converted and was very aware of his atrial flutter. He was, however, on diltiazem  despite the fact that he went into his abnormal rhythm and so I added a low-dose beta blocker which seems to have controlled his rate and blood pressure better. He does feel a little more tired easily which he contributes possibly to the medicine, however, his wife noted that he was a little more depressed. I am not clear if this is related to the beta blocker or other factors. He has had no recurrence of atrial flutter or fibrillation that I can tell and was taken off Xarelto . His CHADS2 score is 1, and I feel like he is at low risk given his age of 32. With the low burden of atrial fibrillation it is very reasonable.  He reports no further reoccurrence of atrial fibrillation or flutter. Recently he's been having problems with high blood pressure and notes that he has several intolerances and/or serious allergies to medications, including angioedema to ACE inhibitors and ARB medications.  He apparently has an allergy to hydrochlorothiazide, but does not recall whether that was rash or not.  [name] returns today for follow-up. He is without complaint. He continues to do both aerobic and resistance exercise without any limitations. In fact she's put on a lot of muscle mass. He denies any chest pain or shortness of breath. He's had no recurrent palpitations or atrial fibrillation or atrial flutter. He has well-controlled blood pressure. He denies any edema on the chlorthalidone  which was started at his last office visit.  [name] returns today for follow-up of atrial flutter.  Unfortunately over the past year he suffered with what sounds like a CMV infection and possibly had another tickborne illness which was treated with doxycycline  [facility]. He also was seen by Dr. Efrain with infectious diseases and Dr. Leigh with gastroenterology for elevated LFTs and right upper quadrant pain. He reports that is significantly improved and he is about 90% better. He denies any recurrent atrial flutter. He has occasional palpitations. He is not regularly taking aspirin.  03/14/2020  [name] is seen today in follow-up.  Recently he was seen in February by Herlene Canterbury, PA-C for new atrial fibrillation.  His last episode was in 2013.  He was restarted on Eliquis  and arrange for cardioversion.  The cardioversion was successful and performed by Dr. Raford suggesting that he actually had had some flutter.  Today he returns and is feeling much better.  He is in sinus rhythm with a first-degree AV block.  Blood pressure is well controlled.  He is on combination diltiazem  and metoprolol  and takes twice daily Eliquis .  He does have a history of obstructive sleep apnea but has been compliant with the treatment.  He said he was not able to use it during the year that his wife had cancer however she is now doing better.  He intends to reach out to [facility] to see if he can be fitted with new equipment.  10/04/2024  [name] is seen today for follow-up.  It has been a number years since I have seen him but  he was seen by our APP's in 2023.  Overall he seems to be doing pretty well.  He denies any recurrent atrial flutter or palpitations.  He has no chest pain or shortness of breath.  He is active and exercises regularly.  He does have a history of diabetes and hypercholesterolemia.  In 2023, he had a coronary calcium  score which was elevated at 344, 61st percentile for age and sex matched controls and atherosclerosis of the aorta was noted.  He had lipid testing  in August which showed total cholesterol 146, HDL 33, triglycerides 109 and LDL 91.  Hemoglobin A1c at that time was 8.5%.  Jardiance  was added by his primary care provider.  He has not had repeat testing for this.  PMHx:  Past Medical History:  Diagnosis Date   Allergic angioedema    Allergy    Hypertension    Non-insulin dependent type 2 diabetes mellitus (HCC)    PAF (paroxysmal atrial fibrillation) (HCC)    Pneumonia    RAD (reactive airway disease)    Sleep apnea    C-pap intolerant   Tick bite     Past Surgical History:  Procedure Laterality Date   APPENDECTOMY     CARDIOVERSION N/A 02/21/2020   Procedure: CARDIOVERSION;  Surgeon: Raford Riggs, MD;  Location: [facility] ENDOSCOPY;  Service: Cardiovascular;  Laterality: N/A;   CHOLECYSTECTOMY     COLONOSCOPY     stress myoview  12/13/2008   EF 61% LV  normal   TONSILLECTOMY      FAMHx:  Family History  Problem Relation Age of Onset   Pancreatic cancer Mother    Hypertension Mother    Cancer Mother    Stroke Father    Hypertension Father    Hypertension Sister    Hypertension Brother    Cancer Maternal Grandfather    Hypertension Daughter    Asthma Son    Colon polyps Neg Hx    Esophageal cancer Neg Hx    Rectal cancer Neg Hx    Stomach cancer Neg Hx    Colon cancer Neg Hx     SOCHx:   reports that he quit smoking about 28 years ago. His smoking use included cigarettes and pipe. He has quit using smokeless tobacco.  His smokeless tobacco use included chew. He reports that he does not drink alcohol and does not use drugs.  ALLERGIES:  Allergies  Allergen Reactions   Ibuprofen Swelling   Lisinopril Swelling    Swelling of face and lips    Oxaprozin Swelling    Swelling of face and lips   Ziac [Bisoprolol-Hydrochlorothiazide] Swelling    Per patient records from Dr. Madelin Juneau.   Bystolic [Nebivolol Hcl] Itching   Hctz [Hydrochlorothiazide] Swelling   Norvasc [Amlodipine Besylate]     Unknown      ROS: Pertinent items noted in HPI and remainder of comprehensive ROS otherwise negative.  HOME MEDS: Current Outpatient Medications  Medication Sig Dispense Refill   apixaban  (ELIQUIS ) 5 MG TABS tablet Take 1 tablet (5 mg total) by mouth 2 (two) times daily. Appt for refills 180 tablet 3   cetirizine (ZYRTEC) 10 MG tablet Take 10 mg by mouth daily.     chlorthalidone  (HYGROTON ) 25 MG tablet Take 1 tablet (25 mg total) by mouth daily. 90 tablet 3   diltiazem  (CARDIZEM  CD) 300 MG 24 hr capsule Take 1 capsule (300 mg total) by mouth daily. 90 capsule 3   empagliflozin  (JARDIANCE ) 10 MG TABS tablet Take  1 tablet (10 mg total) by mouth daily before breakfast. 90 tablet 3   metFORMIN  (GLUCOPHAGE -XR) 500 MG 24 hr tablet TAKE 2 TABLETS BY MOUTH TWICE A DAY 360 tablet 3   metoprolol  succinate (TOPROL -XL) 50 MG 24 hr tablet Take 1 tablet (50 mg total) by mouth daily. Take with or immediately following a meal 90 tablet 3   pravastatin  (PRAVACHOL ) 20 MG tablet TAKE 1 TABLET BY MOUTH EVERY DAY 90 tablet 3   No current facility-administered medications for this visit.    LABS/IMAGING: No results found for this or any previous visit (from the past 48 hours). No results found.  VITALS: BP 114/74   Pulse 74   Ht 6' 2 (1.88 m)   Wt 244 lb 12.8 oz (111 kg)   SpO2 96%   BMI 31.43 kg/m   EXAM: General appearance: alert and no distress Neck: no adenopathy, no carotid bruit, no JVD, supple, symmetrical, trachea midline and thyroid  not enlarged, symmetric, no tenderness/mass/nodules Lungs: clear to auscultation bilaterally Heart: regular rate and rhythm, S1, S2 normal, no murmur, click, rub or gallop Abdomen: soft, non-tender; bowel sounds normal; no masses,  no organomegaly Extremities: edema trace edema Pulses: 2+ and symmetric Skin: Skin color, texture, turgor normal. No rashes or lesions Neurologic: Grossly normal  EKG: EKG Interpretation Date/Time:  Tuesday October 04 2024 08:43:40  EDT Ventricular Rate:  74 PR Interval:  226 QRS Duration:  94 QT Interval:  392 QTC Calculation: 435 R Axis:   -42  Text Interpretation: Sinus rhythm with 1st degree A-V block Left axis deviation Abnormal QRS-T angle, consider primary T wave abnormality When compared with ECG of 21-Feb-2020 07:55, No significant change was found Confirmed by Mona Kent [phone]) on 10/04/2024 9:01:54 AM    ASSESSMENT: Hypertension Paroxysmal atrial flutter on Eliquis , CHADSVASC score of 2 OSA Type 2 diabetes-A1c 8.5% Dyslipidemia, goal LDL less than 70 CAC score of 344, 61st percentile (2023) Aortic atherosclerosis  PLAN: 1.   [name] continues to feel well and is asymptomatic.  He has not had any recurrent atrial flutter.  He does have diabetes but is not well-controlled.  Recently Jardiance  was added.  His cholesterol also remains above target LDL less than 70 as he had a coronary calcium  score that was significantly elevated in 2023.  He is on low-dose pravastatin .  He has not tried other statins in the past.  I advise stopping that and we will switch him to rosuvastatin  20 mg daily.  He should have repeat lipids in about 3 months which should coincide with a follow-up with his primary care provider and could be assessed at that time with a repeat A1c.  Plan otherwise follow-up with me or APP annually or sooner as necessary.  Kent KYM Mona, MD, [facility], FNLA, FACP  [place]  [facility]  Medical Director of the Advanced Lipid Disorders &  Cardiovascular Risk Reduction Clinic Diplomate of the American Board of Clinical Lipidology Attending Cardiologist  Direct Dial: [phone]  Fax: [phone]  Website:  [email or web address] Kent BROCKS Ezzard Ditmer 10/04/2024, 9:02 AM

## 2024-10-04 NOTE — Patient Instructions (Signed)
 Medication Instructions:  Stop Pravastatin  Start Rosuvastatin  (Crestor ) 20 mg daily *If you need a refill on your cardiac medications before your next appointment, please call your pharmacy*  Lab Work: Follow up labs with PCP If you have labs (blood work) drawn today and your tests are completely normal, you will receive your results only by: MyChart Message (if you have MyChart) OR A paper copy in the mail If you have any lab test that is abnormal or we need to change your treatment, we will call you to review the results.  Testing/Procedures: None ordered  Follow-Up: At [facility], you and your health needs are our priority.  As part of our continuing mission to provide you with exceptional heart care, our providers are all part of one team.  This team includes your primary Cardiologist (physician) and Advanced Practice Providers or APPs (Physician Assistants and Nurse Practitioners) who all work together to provide you with the care you need, when you need it.  Your next appointment:   1 year(s)  Provider:   Vinie JAYSON Maxcy, MD    We recommend signing up for the patient portal called MyChart.  Sign up information is provided on this After Visit Summary.  MyChart is used to connect with patients for Virtual Visits (Telemedicine).  Patients are able to view lab/test results, encounter notes, upcoming appointments, etc.  Non-urgent messages can be sent to your provider as well.   To learn more about what you can do with MyChart, go to [email or web address].

## 2024-10-05 DIAGNOSIS — F4320 Adjustment disorder, unspecified: Secondary | ICD-10-CM | POA: Diagnosis not present

## 2024-10-20 ENCOUNTER — Encounter: Payer: Self-pay | Admitting: Family Medicine

## 2024-10-20 DIAGNOSIS — F418 Other specified anxiety disorders: Secondary | ICD-10-CM

## 2024-10-20 MED ORDER — ALPRAZOLAM 0.25 MG PO TABS
0.2500 mg | ORAL_TABLET | Freq: Every day | ORAL | 0 refills | Status: AC | PRN
Start: 2024-10-20 — End: ?

## 2024-11-14 DIAGNOSIS — F4320 Adjustment disorder, unspecified: Secondary | ICD-10-CM | POA: Diagnosis not present

## 2025-02-06 ENCOUNTER — Ambulatory Visit: Payer: Self-pay

## 2025-02-06 ENCOUNTER — Encounter: Payer: Self-pay | Admitting: Family Medicine

## 2025-02-10 ENCOUNTER — Ambulatory Visit: Payer: Self-pay

## 2025-02-27 ENCOUNTER — Ambulatory Visit: Admitting: Urology
